# Patient Record
Sex: Female | Born: 1955 | Race: White | Hispanic: No | Smoking: Former smoker
Health system: Southern US, Community
[De-identification: ages and names within clinical notes are randomized; demographics above are authoritative.]

## PROBLEM LIST (undated history)

## (undated) DIAGNOSIS — I499 Cardiac arrhythmia, unspecified: Secondary | ICD-10-CM

## (undated) DIAGNOSIS — I219 Acute myocardial infarction, unspecified: Secondary | ICD-10-CM

## (undated) DIAGNOSIS — E079 Disorder of thyroid, unspecified: Secondary | ICD-10-CM

## (undated) DIAGNOSIS — Z720 Tobacco use: Secondary | ICD-10-CM

## (undated) DIAGNOSIS — I509 Heart failure, unspecified: Secondary | ICD-10-CM

## (undated) DIAGNOSIS — J449 Chronic obstructive pulmonary disease, unspecified: Secondary | ICD-10-CM

## (undated) DIAGNOSIS — I428 Other cardiomyopathies: Secondary | ICD-10-CM

## (undated) DIAGNOSIS — D649 Anemia, unspecified: Secondary | ICD-10-CM

## (undated) DIAGNOSIS — R0600 Dyspnea, unspecified: Secondary | ICD-10-CM

## (undated) DIAGNOSIS — R112 Nausea with vomiting, unspecified: Secondary | ICD-10-CM

## (undated) DIAGNOSIS — I10 Essential (primary) hypertension: Secondary | ICD-10-CM

## (undated) DIAGNOSIS — I5181 Takotsubo syndrome: Secondary | ICD-10-CM

## (undated) DIAGNOSIS — K219 Gastro-esophageal reflux disease without esophagitis: Secondary | ICD-10-CM

## (undated) DIAGNOSIS — E669 Obesity, unspecified: Secondary | ICD-10-CM

## (undated) DIAGNOSIS — F419 Anxiety disorder, unspecified: Secondary | ICD-10-CM

## (undated) DIAGNOSIS — Z9889 Other specified postprocedural states: Secondary | ICD-10-CM

## (undated) DIAGNOSIS — K922 Gastrointestinal hemorrhage, unspecified: Secondary | ICD-10-CM

## (undated) DIAGNOSIS — E785 Hyperlipidemia, unspecified: Secondary | ICD-10-CM

## (undated) HISTORY — DX: Other cardiomyopathies: I42.8

---

## 2008-05-03 HISTORY — PX: CARDIAC CATHETERIZATION: SHX172

## 2008-06-02 ENCOUNTER — Inpatient Hospital Stay (HOSPITAL_COMMUNITY): Admission: EM | Admit: 2008-06-02 | Discharge: 2008-06-07 | Payer: Self-pay | Admitting: Emergency Medicine

## 2008-06-02 ENCOUNTER — Encounter (INDEPENDENT_AMBULATORY_CARE_PROVIDER_SITE_OTHER): Payer: Self-pay | Admitting: Cardiology

## 2008-06-03 ENCOUNTER — Ambulatory Visit: Payer: Self-pay | Admitting: Infectious Diseases

## 2010-08-17 LAB — D-DIMER, QUANTITATIVE: D-Dimer, Quant: 0.45 ug/mL-FEU (ref 0.00–0.48)

## 2010-08-17 LAB — BASIC METABOLIC PANEL
CO2: 26 mEq/L (ref 19–32)
Calcium: 9.2 mg/dL (ref 8.4–10.5)
Chloride: 103 mEq/L (ref 96–112)
GFR calc Af Amer: >60 mL/min (ref >60)
Potassium: 4.6 mEq/L (ref 3.5–5.1)
Sodium: 137 mEq/L (ref 135–145)

## 2010-08-17 LAB — POCT CARDIAC MARKERS: Myoglobin, poc: 110 ng/mL (ref 12–200)

## 2010-08-17 LAB — DIFFERENTIAL
Basophils Relative: 1 % (ref 0–1)
Eosinophils Relative: 0 % (ref 0–5)
Monocytes Absolute: 0.4 10*3/uL (ref 0.1–1.0)
Monocytes Relative: 6 % (ref 3–12)
Neutro Abs: 4.7 10*3/uL (ref 1.7–7.7)

## 2010-08-17 LAB — COMPREHENSIVE METABOLIC PANEL
ALT: 19 U/L (ref 0–35)
AST: 30 U/L (ref 0–37)
CO2: 26 mEq/L (ref 19–32)
Chloride: 106 mEq/L (ref 96–112)
Creatinine, Ser: 0.91 mg/dL (ref 0.4–1.2)
GFR calc Af Amer: >60 mL/min (ref >60)
GFR calc non Af Amer: >60 mL/min (ref >60)
Sodium: 141 mEq/L (ref 135–145)
Total Bilirubin: 1.2 mg/dL (ref 0.3–1.2)

## 2010-08-17 LAB — CARDIAC PANEL(CRET KIN+CKTOT+MB+TROPI): Total CK: 137 U/L (ref 7–177)

## 2010-08-17 LAB — CBC
HCT: 43 % (ref 36.0–46.0)
Hemoglobin: 14.4 g/dL (ref 12.0–15.0)
MCHC: 33.6 g/dL (ref 30.0–36.0)
MCV: 95.1 fL (ref 78.0–100.0)
RBC: 4.52 MIL/uL (ref 3.87–5.11)

## 2010-08-17 LAB — HEPARIN LEVEL (UNFRACTIONATED)
Heparin Unfractionated: 0.6 IU/mL (ref 0.30–0.70)
Heparin Unfractionated: 0.86 IU/mL — ABNORMAL HIGH (ref 0.30–0.70)

## 2010-08-17 LAB — TROPONIN I: Troponin I: 1.13 ng/mL (ref 0.00–0.06)

## 2010-08-17 LAB — TSH: TSH: 2.138 u[IU]/mL (ref 0.350–4.500)

## 2010-08-18 LAB — CBC
HCT: 37.6 % (ref 36.0–46.0)
HCT: 38.2 % (ref 36.0–46.0)
HCT: 39.3 % (ref 36.0–46.0)
Hemoglobin: 12.2 g/dL (ref 12.0–15.0)
Hemoglobin: 12.8 g/dL (ref 12.0–15.0)
Hemoglobin: 13.1 g/dL (ref 12.0–15.0)
Hemoglobin: 13.4 g/dL (ref 12.0–15.0)
Hemoglobin: 13.4 g/dL (ref 12.0–15.0)
MCHC: 33.4 g/dL (ref 30.0–36.0)
MCHC: 33.6 g/dL (ref 30.0–36.0)
MCHC: 34.2 g/dL (ref 30.0–36.0)
MCHC: 35.5 g/dL (ref 30.0–36.0)
MCV: 93.6 fL (ref 78.0–100.0)
MCV: 94.3 fL (ref 78.0–100.0)
Platelets: 174 10*3/uL (ref 150–400)
RBC: 3.82 MIL/uL — ABNORMAL LOW (ref 3.87–5.11)
RBC: 4 MIL/uL (ref 3.87–5.11)
RBC: 4.02 MIL/uL (ref 3.87–5.11)
RDW: 13.2 % (ref 11.5–15.5)
RDW: 13.3 % (ref 11.5–15.5)
RDW: 13.4 % (ref 11.5–15.5)
RDW: 13.4 % (ref 11.5–15.5)
RDW: 13.4 % (ref 11.5–15.5)
RDW: 13.7 % (ref 11.5–15.5)
WBC: 3.6 10*3/uL — ABNORMAL LOW (ref 4.0–10.5)
WBC: 5 10*3/uL (ref 4.0–10.5)

## 2010-08-18 LAB — POCT I-STAT 3, ART BLOOD GAS (G3+)
Acid-base deficit: 1 mmol/L (ref 0.0–2.0)
Acid-base deficit: 3 mmol/L — ABNORMAL HIGH (ref 0.0–2.0)
Bicarbonate: 23.8 mEq/L (ref 20.0–24.0)
O2 Saturation: 93 %
TCO2: 25 mmol/L (ref 0–100)
pH, Arterial: 7.342 — ABNORMAL LOW (ref 7.350–7.400)
pO2, Arterial: 37 mmHg — CL (ref 80.0–100.0)

## 2010-08-18 LAB — LIPID PANEL: Cholesterol: 161 mg/dL (ref 0–200)

## 2010-08-18 LAB — HIV ANTIBODY (ROUTINE TESTING W REFLEX): HIV: NONREACTIVE

## 2010-08-18 LAB — BASIC METABOLIC PANEL
BUN: 16 mg/dL (ref 6–23)
CO2: 27 mEq/L (ref 19–32)
CO2: 27 mEq/L (ref 19–32)
CO2: 28 mEq/L (ref 19–32)
CO2: 28 mEq/L (ref 19–32)
Calcium: 9 mg/dL (ref 8.4–10.5)
Calcium: 9.1 mg/dL (ref 8.4–10.5)
Calcium: 9.3 mg/dL (ref 8.4–10.5)
Chloride: 103 mEq/L (ref 96–112)
Chloride: 107 mEq/L (ref 96–112)
Creatinine, Ser: 0.86 mg/dL (ref 0.4–1.2)
Creatinine, Ser: 0.9 mg/dL (ref 0.4–1.2)
GFR calc Af Amer: >60 mL/min (ref >60)
GFR calc Af Amer: >60 mL/min (ref >60)
GFR calc non Af Amer: >60 mL/min (ref >60)
GFR calc non Af Amer: >60 mL/min (ref >60)
Glucose, Bld: 103 mg/dL — ABNORMAL HIGH (ref 70–99)
Glucose, Bld: 80 mg/dL (ref 70–99)
Glucose, Bld: 90 mg/dL (ref 70–99)
Glucose, Bld: 94 mg/dL (ref 70–99)
Glucose, Bld: 98 mg/dL (ref 70–99)
Potassium: 3.8 mEq/L (ref 3.5–5.1)
Potassium: 3.9 mEq/L (ref 3.5–5.1)
Potassium: 4.1 mEq/L (ref 3.5–5.1)
Sodium: 136 mEq/L (ref 135–145)
Sodium: 136 mEq/L (ref 135–145)
Sodium: 141 mEq/L (ref 135–145)
Sodium: 142 mEq/L (ref 135–145)

## 2010-08-18 LAB — HISTOPLASMA ANTIGEN, URINE
Histoplasma Antigen Urine: NEGATIVE
Histoplasma Antigen, urine: <2 U/mL

## 2010-08-18 LAB — MISCELLANEOUS TEST

## 2010-08-18 LAB — CULTURE, BLOOD (ROUTINE X 2): Culture: NO GROWTH

## 2010-08-18 LAB — SEDIMENTATION RATE: Sed Rate: 25 mm/hr — ABNORMAL HIGH (ref 0–22)

## 2010-08-18 LAB — HEPARIN LEVEL (UNFRACTIONATED): Heparin Unfractionated: 0.57 IU/mL (ref 0.30–0.70)

## 2010-09-15 NOTE — Cardiovascular Report (Signed)
NAMELEONARDA, [name]              ACCOUNT NO.:  [id number]   MEDICAL RECORD NO.:  [id number]          PATIENT TYPE:  INP   LOCATION:  2928                         FACILITY:  [facility]   PHYSICIAN:  Jake Bathe, MD      DATE OF BIRTH:  May 30, 1955   DATE OF PROCEDURE:  DATE OF DISCHARGE:                            CARDIAC CATHETERIZATION   PROCEDURES:  1. Left heart catheterization.  2. Selective coronary angiography.  3. Left ventriculogram.  4. Right heart catheterization with cardiac outputs measured.   INDICATIONS:  Non-ST-elevation myocardial infarction with troponin  elevation of approximately 1 with deep T-wave inversions on ECG abnormal  and shortness of breath.  Ejection fraction 30-35% on echocardiogram  with mid anterior to apical as well as mid inferior to apical akinesis.   PROCEDURE DETAILS:  Informed consent was obtained.  Risk of stroke,  heart attack, death, renal impairment, bleeding, arterial damage were  explained to the patient at length.  She was prepped in a sterile  fashion and placed on the catheterization table.  Lidocaine 1% was used  for local anesthesia.  Visualization of the femoral head was obtained  under fluoroscopy.  Using the modified Seldinger technique, a 6-French  sheath was placed into the right femoral artery.  A Judkins left #4  catheter was used guided by wire and fluoroscopy and selectively  cannulated in the left main artery.  Multiple views with hand injection  of Omnipaque were obtained.  This catheter was then exchanged for a  Judkins right #4 catheter and multiple views with hand injection of  Omnipaque were obtained in the right coronary artery.  This catheter was  exchanged for an angle pigtail which was used across the aortic valve  into the left ventricle.  Hemodynamics were obtained.  In the RAO  position under power injection 35 mL of contrast was used for a left  ventriculogram.  This catheter was then brought across the aortic  valve  and hemodynamics were obtained.  Following the left heart  catheterization using the modified Seldinger technique, a 7-French  sheath was placed into the right femoral vein.  A Swan Ganz or balloon-  tipped catheter was then used to obtain multiple pressures sequentially  in the right throughout right heart structures.  Oxygen saturations were  obtained.  Both Fick  and thermal cardiac outputs were obtained.  Following the procedure, both catheters were removed and sheath was  removed.  The patient was hemodynamically stable.   FINDINGS:  1. Left main artery - no angiographically significant coronary artery      disease.  2. Left anterior descending artery - no angiographically significant      coronary artery disease.  There is one early branching diagonal      which is moderate-sized caliber.  The mid to distal portion of the      LAD is small in caliber and continues to wrap around the apex.  3. Left circumflex artery.  No angiographically significant coronary      artery disease.  There is one early branching obtuse marginal with  otherwise no disease.  4. Right coronary artery dominant vessel giving rise to the PDA.  No      angiographically significant coronary artery disease present.  5. Left ventriculogram ejection fraction 30-35%.  Normal basilar wall      contraction with mid to distal anterior and inferior wall akinesis.      There was no evidence of any thrombus in the left ventricle.  No      significant mitral regurgitation.   HEMODYNAMICS:  Left ventricular systolic pressure was 113 with an end-  diastolic pressure of 20.  Aortic pressure was 113/56 mean of 77.  There  was no gradient.  Right heart catheterization:  1. Right atrium 9/6 with a mean of 5, right ventricle 34/0, right      ventricular end-diastolic pressure of 8 mmHg.  Pulmonary artery      pressure was 36/15 with a mean of 25 mmHg.  Pulmonary capillary      wedge pressure was 19/18 with a mean  of 14 mmHg.  Cardiac output by      thermal method was 4.8 L/min averaged with a cardiac index of 2.54.      Cardiac output by Fick method was 5.36 L/min with a cardiac index      of 2.8.  Pulmonary artery saturation was 67%.  Aortic saturation      was 93%.   IMPRESSION:  1. No angiographically significant coronary artery disease.  2. Severely decreased left ventricular ejection fraction of 30-35%      with wall motion abnormality consistent with stress-induced      cardiomyopathy, basilar sparing of contractile performance with      akinesis of the mid-to-distal anterior and inferior wall portions.      No evidence of thrombus noted.  3. Normal aortic valve gradient, no mitral regurgitation.  4. Upper normal right-sided pressures with pulmonary artery pressure      of 36/15 with a mean of 25.   PLAN:  We will continue to treat her heart failure or acute systolic  dysfunction with both beta blocker and ACE inhibitor as tolerated.  Continue aspirin at this time.  We will discontinue heparin.  Given  impressive lipid profile, I will decrease her statin.  I will  investigate once again any stressful incidences that may have occurred  in her life recently.  The only thing that she had mentioned previously  was this no event here in [place].  Continue with Lasix as needed.   Her left upper lobe density was investigated by Dr. Rito Ehrlich with [facility]  hospitalist who stated that she did not require any treatment at this  time.      Jake Bathe, MD  Electronically Signed     MCS/MEDQ  D:  06/03/2008  T:  06/04/2008  Job:  [id number]

## 2010-09-15 NOTE — H&P (Signed)
[name], TOPPING              ACCOUNT NO.:  [id number]   MEDICAL RECORD NO.:  [id number]          PATIENT TYPE:  EMS   LOCATION:  MAJO                         FACILITY:  [facility]   PHYSICIAN:  Jake Bathe, MD      DATE OF BIRTH:  01-May-1956   DATE OF ADMISSION:  06/02/2008  DATE OF DISCHARGE:                              HISTORY & PHYSICAL   CHIEF COMPLAINT:  Shortness of breath/non-ST-elevation myocardial  infarction.   HISTORY OF PRESENT ILLNESS:  A 55 year old female with no prior past  medical history, no prior cardiovascular history who quit tobacco use in  2004 with no prior history of hypertension, hyperlipidemia, who has a  brother who had bypass surgeries at age 43, and who yesterday developed  acute worsening of shortness of breath at approximately 3:00 p.m.  following work.  She was outside and shoveled snow for only a few short  minutes and noticed excessive difficulty breathing.  She took her pulse  and it was noted to be elevated in the 120s.  She noticed that she was  having difficulty breathing and needed to take a deep breath.  She has  noted over the past 2 weeks or 3 weeks that she has had increasing  orthopnea for difficulty lying flat in regards to breathing.  She did  have some mild diaphoresis and palpitations surrounding this episode.  She denies any chest pressure or chest pain, however.  Denies nausea,  weight loss, fevers, or cough.  No prior pneumonias, hospitalizations,  or travel.  She has no recent sick contacts.  Currently in the emergency  department, she is still with mild-to-moderate shortness of breath but  better when sitting up or leaning forward.   Her EKG in the emergency department shows diffuse T-wave inversion  consistent with ischemia.  She also has poor R-wave progression.  In  addition, her cardiac biomarkers are elevated with a initial set CK of  110, MB of 9.9, troponin of 0.34 and second set showing a CK of 137, MB  of 15.5, and  troponin of 0.97.   Her chest x-ray showed a left upper lobe possible mass and therefore a  chest CT was performed which showed no evidence of pulmonary embolism.  However, she did have a left upper lobe interstitial confluency which  may be suggestive of fungal pneumonia or perhaps sarcoidosis.  The  radiologist did not feel that this was a malignancy - Dr. Rito Ehrlich with  [facility] has been consulted regarding this matter.   PAST MEDICAL HISTORY:  None.   MEDICATIONS:  None.   ALLERGIES:  No known drug allergies.   SOCIAL HISTORY:  Denies tobacco use but quit in 2004.  She does drink  approximately 3-4 drinks of alcohol or glasses per day.  She works at a  nursing home.  No drug use.  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   FAMILY HISTORY:  Brother had bypass surgery at age 37, so early family  history of coronary artery disease.   REVIEW OF SYSTEMS:  Denies any headache, bleeding, joint pain, visual  changes, no cough, as per HPI above.  Unless stated above, all other 12  review of systems negative.   PHYSICAL EXAMINATION:  VITAL SIGNS:  Blood pressure currently 106/99,  respirations 25-28, saturating 99% on 2 L, and afebrile.  GENERAL:  Alert and oriented x3 in no acute distress, sitting up in bed.  EYES:  Well-perfused conjunctivae.  EOMI.  No scleral icterus.  NECK:  Supple.  No JVD appreciated.  No lymphadenopathy.  No  thyromegaly.  No carotid bruits.  Normal carotid upstrokes.  CARDIOVASCULAR:  Tachycardic, regular rhythm with what sounds like an S3  gallop.  Normal-appearing PMI.  LUNGS:  Clear to auscultation bilaterally.  I do not appreciate any lung  changes in her left upper lobe.  She does not have any basilar crackles.  Normal respiratory effort except for slight tachypnea.  ABDOMEN:  Soft and nontender.  Normoactive bowel sounds.  No bruits.  No  hepatosplenomegaly.  EXTREMITIES:  No clubbing, cyanosis, or edema.  2+ femoral pulses  bilaterally.  No  bruits.  SKIN:  Warm, dry, and intact.  No rashes.  Slightly diaphoretic.  NEUROLOGIC:  Nonfocal.  No tremors noted.  No weaknesses.  PSYCH:  Normal affect.  Normal mood.  Here with her daughter at bedside.   DIAGNOSTIC DATA:  Chest x-ray and CT angiogram of chest shows as left  upper lobe density of unknown significance possible sarcoidosis or  fungal pneumonia, further workup pending.  No evidence of pulmonary  embolism.  EKG shows sinus tachycardia with T-wave diffuse inversion  suggestive of ischemia with no ST elevations or depressions.  Poor R-  wave progression.   LABORATORY DATA:  Cardiac biomarkers as demonstrate above with the most  notable MB of 15.5 and troponin of 0.97.  Sodium 137, potassium 4.6, BUN  12, creatinine 0.8, glucose 118, and calcium 9.2, normal.  D-dimer 0.45,  normal.  BNP elevated at 751.  White count of 6.0, hemoglobin 14.4,  hematocrit 43, and platelets 208.   ASSESSMENT AND PLAN:  1. Non-ST elevation myocardial infarction - EKG with T-wave inversion      consistent with ischemia, positive cardiac biomarkers consistent      with myocardial injury.  Bedside echo done by me does demonstrate      what I believe to be a distal anterior wall akinesis or apical      akinesis, however, I have called in echo technician for further      evaluation or official evaluation.  She has already been started on      heparin IV ACS, which I will continue and continue aspirin.  I will      add a low-dose beta-blocker to her regimen keeping in mind that she      may be in mild degree of failure watching for any signs of      deterioration.  Her BNP is elevated.  Therefore, I will give her      Lasix 40 mg IV x1 hopefully helping her with her orthopnea.  I have      discussed cardiac catheterization with her which I will put her on      the board for the morning.  Risks and benefits of stroke, heart      attack, death, renal impairment, bleeding, and arterial damage have       been explained to her at length with her daughter at bedside.  2. Left upper lobe density - questionable etiology.  I asked Dr.  Rito Ehrlich with [facility] to consult for further evaluation.      Of course, this may come into play if she does need bypass surgery.      Jake Bathe, MD  Electronically Signed     MCS/MEDQ  D:  06/02/2008  T:  06/02/2008  Job:  [id number]

## 2010-09-15 NOTE — Discharge Summary (Signed)
[name], [name]              ACCOUNT NO.:  [id number]   MEDICAL RECORD NO.:  [id number]          PATIENT TYPE:  INP   LOCATION:  3740                         FACILITY:  [facility]   PHYSICIAN:  Jake Bathe, MD      DATE OF BIRTH:  1955-05-09   DATE OF ADMISSION:  06/02/2008  DATE OF DISCHARGE:  06/07/2008                               DISCHARGE SUMMARY   DISCHARGE DIAGNOSES:  1. Acute systolic heart failure/left ventricular dysfunction -      ejection fraction on admission 30-35% with anteroapical,      inferoapical akinesis confirmed by both echocardiography and      cardiac catheterization.  Bedside echocardiogram on the day of      discharge, which was unofficial demonstrated mild improvement in      these akinetic segments with an ejection fraction currently of      approximately 40-45%.  2. Non-ST-elevation myocardial infarction - CK 137, MB of 15.5,      troponin of 1.1 with associated abnormal EKG changes with diffuse T-      wave inversions, quite severe in the precordial leads.  3. Left upper lobe lung infiltrate - Appreciate infectious disease      consult by Dr. Sampson Goon.  Radiologist commented as well as      infectious disease that this may be a possible granulomatous or      fungal process.  She has had no symptoms of pneumonia such as      fevers, chills, weight loss, elevated white count.  Thus far, her      infectious disease workup consisting of human immunodeficiency      virus, Cryptococcus antibody has been negative.  She does have      infectious disease followup.  4. Alcohol use - admits to drinking 3-4 beers per day.  May be playing      a role in the etiology of her cardiomyopathy.  5. Hypotension - she has had difficulty up-titrating both beta-blocker      and angiotensin-converting enzyme inhibitor secondary to blood      pressures below 100 systolic.  No dizziness on the morning of      discharge.   PROCEDURES:  1. Left heart catheterization - no  angiographically significant      coronary artery disease - ejection fraction of 30-35% with apical      akinesis consistent with stress-induced cardiomyopathy with basilar      sparing of contractile performance.  Right heart catheterization      demonstrated pulmonary artery mean pressure of 25 mmHg with a wedge      pressure of 14 mmHg.  2. Chest CT as above.   BRIEF HOSPITAL COURSE:  This is a 55 year old female with no prior past  medical history who was admitted with subacute shortness of breath  consistent with acute heart failure.  Her EKG was markedly abnormal with  deeply inverted T-waves consistent with ischemia and her troponin was  elevated consistent with non-ST-elevation MI.  When questioning, she did  have the anniversary of her mother's death approximately 4 days  prior to  presentation, which may have been stressful incident for her.  She was  also shoveling snow when she was acutely short of breath.  A chest CT  showed no evidence of pulmonary embolism.  The next day she was taken to  the Cardiac Catheterization Lab where she showed no evidence of thrombus  or coronary artery disease and her right-sided pressures were within the  upper normal range.  Her left ventriculogram demonstrated basilar  sparing of contractile performance with essentially anteroapical and  inferoapical akinesis.  The morphology of this look like stressed-  induced cardiomyopathy.  Up-titration of metoprolol and lisinopril was  difficult in that the day after her catheterization 5mg  of lisinopril  was administered and her pressures were down into the upper 70s to low  80s.  Therefore on the discharge day, she was only on 12.5 mg metoprolol  b.i.d. as well as lisinopril 2.5 mg once a day.  On the day of  discharge, she states that she is not dizzy, no syncope, ambulating well  without any difficulty.  Shortness of breath has essentially resolved.  She no longer complains of any orthopnea.   Infectious disease as well as  internal medicine consults were obtained in regards to her left upper  lobe lung process and they will follow up with her in approximately 1  month.  She may require further CT scan surveillance, plus/minus  bronchoscopy.   On the morning of discharge, I did perform personally a bedside  echocardiogram, which is unofficial and her apex did look like it had  some mild improvement with improvement in her overall ejection fraction  of approximately 40-45%.   DISCHARGE MEDICATIONS:  1. Aspirin 81 mg once a day.  2. Simvastatin 10 mg once a day.  3. Lisinopril 2.5 mg once a day.  4. Metoprolol 12.5 mg twice a day.   LABORATORY DATA:  BNP on admission was 751, subsequent BNP was 200.  Hemoglobin A1c 4.8.  Thyroid stimulating hormone was normal.  LDL was  66, HDL 85, triglycerides 49.  White count 5.0, hemoglobin 13.4,  hematocrit 39.3, platelets 174, creatinine 0.9.  LFTs within normal  range.  Sed rate mildly elevated at 25.  HIV, nonreactive.  Cryptococcal  antibody, negative.  Blood cultures, no growth.  Repeat chest x-ray  showed subtle resolution of left upper lobe lung process, perhaps they  recommended CT scan followup.   PATIENT INSTRUCTIONS:  Increase activity slowly.  Low-salt diet with 1-2  L fluid restriction.  She has a followup with me on Wednesday, June 19, 2008, with an echocardiogram scheduled at 9:45 a.m. and appointment  with me at 11:30 a.m.  Infectious Disease/Dr. Sampson Goon will contact  her regarding followup appointment in approximately 1 month regarding  left upper lobe lung process.      Jake Bathe, MD  Electronically Signed     MCS/MEDQ  D:  06/07/2008  T:  06/07/2008  Job:  [id number]   cc:   Mick Sell, MD

## 2010-09-15 NOTE — Consult Note (Signed)
Renee, Wood              ACCOUNT NO.:  [id number]   MEDICAL RECORD NO.:  [id number]          PATIENT TYPE:  INP   LOCATION:  1823                         FACILITY:  [facility]   PHYSICIAN:  Hollice Espy, M.D.DATE OF BIRTH:  Jun 02, 1955   DATE OF CONSULTATION:  06/02/2008  DATE OF DISCHARGE:                                 CONSULTATION   PRIMARY CARE PHYSICIAN:  The patient has no primary care physician.   REASON FOR CONSULTATION:  Abnormal CT findings.   HISTORY OF PRESENT ILLNESS:  The patient is a 55 year old white female  with no past medical history other then what sounds to be alcohol excess  who has otherwise not really ever seen a doctor and came into the  emergency room on June 02, 2008 complaining of some increasing  shortness of breath for the last 1 day.  When she was evaluated, she was  noted to have a heart rate of 122, sinus tach, with a blood pressure  155/100.  Her oxygen saturations were actually 95% on room air and 99%  on 2 liters.  Labs were ordered and she was found to have a BNP of 751  and a CPK of 110, MB of 9.9 with a troponin of 0.34.  Subsequent sets  showed an elevation consistent with an early myocardial infarction.  A  chest x-ray was done as part of the initial workup and showed some  probable chronic interstitial lung disease with right upper lobe  scarring and a 4.4 x 1.7 cm mass-like density in the left upper lobe.  A  chest CT was recommended.  Chest CT findings showed a confluence in that  upper lobe, but no signs of metastases; more consistent with a fungal  pneumonia.  With these findings, Dr. Anne Fu from Cardiology consulted  the [facility] hospitalist for further evaluation of these findings.  When I  talked to the patient, she was doing well.  She complained of feeling a  little bit short of breath, but better with oxygen.  She denied any  chest pain.  She denied any other symptoms such as headaches, vision  changes or dysphagia.  No  weight loss in the past year, no palpitations,  no wheezing.  She has had no cough, productive or nonproductive.  No  abdominal pain.  No hematuria, dysuria, constipation, diarrhea, focal  extremity numbness, weakness or pain.  The patient tells me she has had  no travel history.  She currently lives in her own trailer home.  She  had not noted any problems with mold.  No one else lives in the house.  She has no pets and no other symptoms or associated history.   REVIEW OF SYSTEMS:  Otherwise negative.   PAST MEDICAL HISTORY:  None.   MEDICATIONS:  None.   ALLERGIES:  None.   SOCIAL HISTORY:  She denies any tobacco or drug use, but she does drink,  she says, about 3 to 4 drinks a day.  She says sometimes it is more,  sometimes it is less.   FAMILY HISTORY:  Noted for COPD and lung cancer.  PHYSICAL EXAMINATION:  PATIENT'S VITALS:  On admission, temperature  97.3, heart rate 122, now down to 99, blood pressure 155/100, now down  to 121/92, respirations 20, O2 sat 95% on room air.  GENERAL:  She is alert and oriented x3, in no apparent distress.  HEENT:  Normocephalic, atraumatic.  Mucous membranes are moist.  NECK:  She has no carotid bruits.  HEART:  Regular rhythm, mild tachycardia.  LUNGS:  Actually for the most part clear to auscultation bilaterally.  CHEST:  Decreased breath sounds, but it is tough to say if this is  secondary to body habitus.  ABDOMEN:  Soft, obese, nontender, positive bowel sounds.  EXTREMITIES:  No clubbing, cyanosis or edema.   LAB WORK:  BNP 751, sodium 137, potassium 4.6, chloride 103, bicarb 26,  BUN  12, creatinine 0.9, glucose 118.  White count 6, no shift,  eosinophil count is completely normal, H&H of 14.4 and 43, MCV of 95,  platelet count 208.  D-dimer 0.45.  CPK 110, MB 9.9, troponin 0.34.  Second set:  CPK 137, MB 15.5, troponin 0.97.  Subsequent sets are  ordered.  Magnesium, TSH and A1c are ordered and are pending.   CHEST X-RAY NOTES:   Probable chronic interstitial lung disease, right  upper lobe scarring, 4.1 x 1.7 cm mass-like density in the left upper  lobe recommending chest CT.  Chest CT report showed no pulmonary emboli,  small bilateral pleural effusions, normal sized calcified mediastinal  bilateral hilar lymph nodes compatible with previous granulomatous  infection and no masses suspicious for malignancy.  Extensive patchy and  irregular alveolar and interstitial opacities in both lungs most  confluent in the upper lobes, this is suspicious for an atypical  infection such as a fungal pneumonia, sarcoidosis was less likely a  consideration.   ASSESSMENT/PLAN:  1. Abnormal CT:  Questionable fungal pneumonia versus other atypical      infection.  At this time, would not treat.  We will just go with      sputum culture only and discuss with Infectious Disease.  2. Myocardial infarction:  Cardiology is managing the patient.  3. Alcohol excess:  We will further discuss with patient.   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      Hollice Espy, M.D.  Electronically Signed     SKK/MEDQ  D:  06/02/2008  T:  06/02/2008  Job:  [id number]   cc:   Jake Bathe, MD  [facility]

## 2011-02-15 ENCOUNTER — Inpatient Hospital Stay (HOSPITAL_COMMUNITY)
Admission: EM | Admit: 2011-02-15 | Discharge: 2011-02-18 | DRG: 280 | Disposition: A | Discharge status: Home Health | Payer: Self-pay | Source: Ambulatory Visit | Attending: Cardiology | Admitting: Cardiology

## 2011-02-15 ENCOUNTER — Emergency Department (HOSPITAL_COMMUNITY): Payer: Self-pay

## 2011-02-15 DIAGNOSIS — I959 Hypotension, unspecified: Secondary | ICD-10-CM | POA: Diagnosis present

## 2011-02-15 DIAGNOSIS — I2510 Atherosclerotic heart disease of native coronary artery without angina pectoris: Secondary | ICD-10-CM | POA: Diagnosis present

## 2011-02-15 DIAGNOSIS — I5181 Takotsubo syndrome: Secondary | ICD-10-CM | POA: Diagnosis present

## 2011-02-15 DIAGNOSIS — I509 Heart failure, unspecified: Secondary | ICD-10-CM | POA: Diagnosis present

## 2011-02-15 DIAGNOSIS — I214 Non-ST elevation (NSTEMI) myocardial infarction: Principal | ICD-10-CM | POA: Diagnosis present

## 2011-02-15 DIAGNOSIS — Z87891 Personal history of nicotine dependence: Secondary | ICD-10-CM

## 2011-02-15 DIAGNOSIS — R0602 Shortness of breath: Secondary | ICD-10-CM

## 2011-02-15 DIAGNOSIS — I252 Old myocardial infarction: Secondary | ICD-10-CM

## 2011-02-15 DIAGNOSIS — I5021 Acute systolic (congestive) heart failure: Secondary | ICD-10-CM | POA: Diagnosis present

## 2011-02-15 LAB — DIFFERENTIAL
Basophils Absolute: 0 10*3/uL (ref 0.0–0.1)
Eosinophils Relative: 5 % (ref 0–5)
Lymphocytes Relative: 19 % (ref 12–46)
Lymphs Abs: 1.1 10*3/uL (ref 0.7–4.0)
Monocytes Absolute: 0.3 10*3/uL (ref 0.1–1.0)
Neutro Abs: 3.8 10*3/uL (ref 1.7–7.7)

## 2011-02-15 LAB — PROTIME-INR: INR: 1.02 (ref 0.00–1.49)

## 2011-02-15 LAB — BASIC METABOLIC PANEL
BUN: 17 mg/dL (ref 6–23)
CO2: 29 mEq/L (ref 19–32)
Calcium: 9.8 mg/dL (ref 8.4–10.5)
Chloride: 102 mEq/L (ref 96–112)
Creatinine, Ser: 0.92 mg/dL (ref 0.50–1.10)
Glucose, Bld: 93 mg/dL (ref 70–99)

## 2011-02-15 LAB — POCT I-STAT TROPONIN I
Troponin i, poc: 1.61 ng/mL (ref 0.00–0.08)
Troponin i, poc: 1.85 ng/mL (ref 0.00–0.08)

## 2011-02-15 LAB — CBC
HCT: 38.2 % (ref 36.0–46.0)
Hemoglobin: 13.2 g/dL (ref 12.0–15.0)
MCHC: 34.6 g/dL (ref 30.0–36.0)
MCV: 90.1 fL (ref 78.0–100.0)

## 2011-02-15 LAB — CK TOTAL AND CKMB (NOT AT ARMC)
CK, MB: 13 ng/mL (ref 0.3–4.0)
Relative Index: 7.8 — ABNORMAL HIGH (ref 0.0–2.5)

## 2011-02-16 LAB — COMPREHENSIVE METABOLIC PANEL
Alkaline Phosphatase: 70 U/L (ref 39–117)
BUN: 16 mg/dL (ref 6–23)
Chloride: 105 mEq/L (ref 96–112)
Creatinine, Ser: 0.83 mg/dL (ref 0.50–1.10)
GFR calc Af Amer: >90 mL/min (ref >90)
Glucose, Bld: 94 mg/dL (ref 70–99)
Potassium: 3.7 mEq/L (ref 3.5–5.1)
Total Bilirubin: 0.6 mg/dL (ref 0.3–1.2)
Total Protein: 6.5 g/dL (ref 6.0–8.3)

## 2011-02-16 LAB — CBC
HCT: 36.1 % (ref 36.0–46.0)
Hemoglobin: 12.2 g/dL (ref 12.0–15.0)
MCH: 30.9 pg (ref 26.0–34.0)
MCHC: 33.8 g/dL (ref 30.0–36.0)
MCV: 91.4 fL (ref 78.0–100.0)
Platelets: 194 10*3/uL (ref 150–400)
RBC: 3.95 MIL/uL (ref 3.87–5.11)
RDW: 13 % (ref 11.5–15.5)
WBC: 5.9 10*3/uL (ref 4.0–10.5)

## 2011-02-16 LAB — APTT: aPTT: 103 s — ABNORMAL HIGH (ref 24–37)

## 2011-02-16 LAB — CARDIAC PANEL(CRET KIN+CKTOT+MB+TROPI)
CK, MB: 10.4 ng/mL (ref 0.3–4.0)
Relative Index: 8.1 — ABNORMAL HIGH (ref 0.0–2.5)
Total CK: 129 U/L (ref 7–177)
Troponin I: 1.25 ng/mL

## 2011-02-16 LAB — POCT ACTIVATED CLOTTING TIME: Activated Clotting Time: 155 seconds

## 2011-02-16 LAB — D-DIMER, QUANTITATIVE
D-Dimer, Quant: 0.47 ug{FEU}/mL (ref 0.00–0.48)
D-Dimer, Quant: 0.54 ug{FEU}/mL — ABNORMAL HIGH (ref 0.00–0.48)

## 2011-02-16 LAB — HEPARIN LEVEL (UNFRACTIONATED)
Heparin Unfractionated: 0.55 [IU]/mL (ref 0.30–0.70)
Heparin Unfractionated: 0.35 [IU]/mL (ref 0.30–0.70)

## 2011-02-16 LAB — MRSA PCR SCREENING: MRSA by PCR: NEGATIVE

## 2011-02-16 LAB — LIPID PANEL
Cholesterol: 143 mg/dL (ref 0–200)
Triglycerides: 60 mg/dL (ref <150)

## 2011-02-16 LAB — PRO B NATRIURETIC PEPTIDE: Pro B Natriuretic peptide (BNP): 1438 pg/mL — ABNORMAL HIGH (ref 0–125)

## 2011-02-16 NOTE — H&P (Signed)
[name], [name]              ACCOUNT NO.:  [id number]  MEDICAL RECORD NO.:  [id number]  LOCATION:  2311                         FACILITY:  [facility]  PHYSICIAN:  Lenon Oms, MD  DATE OF BIRTH:  March 09, 1956  DATE OF ADMISSION:  02/15/2011 DATE OF DISCHARGE:                             HISTORY & PHYSICAL   PRIMARY CARDIOLOGIST:  Loraine Leriche C. Anne Fu, MD  CHIEF COMPLAINT:  Shortness of breath.  HISTORY OF PRESENT ILLNESS:  Ms. [name] is a 55 year old Caucasian female with past medical history significant for coronary artery disease with non-ST-segment elevation MI in 2010 with acute systolic heart failure, LV dysfunction, EF of 30-35%, who presents to the emergency department today here at [facility] with chief complaint of shortness of breath.  The patient stated that she today while at work she felt short of breath requiring her to sit down and stop what she was doing.  In addition, at that time, she felt dizzy, like her heart was racing along with diaphoresis and weakness.  The patient stated that she had this happened to her 1 week ago at home.  However, this lasted 5 minutes and went away after she sat down to rest.  She did not have any episodes since.  As she worked at a nursing home, she sent for the for the nurse who came to evaluate her.  At that time, vital signs were reportedly stable.  She was given 4 nitroglycerin and sent to the emergency department for further evaluation.  She has also described orthopnea over the last several days, however, denies any increasing lower extremity edema and PND.  She also denies any chest pain at this time.  PAST MEDICAL HISTORY: 1. Coronary artery disease status post non-ST-segment elevation MI in     2010. 2. Systolic heart failure/left ventricular dysfunction, EF of 30-     35% with anteroapical and inferoapical akinesis. 3. Left heart catheterization at that time revealed no     angiographically significant coronary  artery disease.  EF was 30-     35% with apical kinesis consistent with stress-induced     cardiomyopathy.  Right heart catheterization demonstrated pulmonary     artery main pressure of 25 with a wedge pressure of 14.  ALLERGIES:  No known drug allergies.  MEDICATIONS:  None.  SOCIAL HISTORY:  The patient lives in [place] alone.  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  She reports that she quit tobacco in 2004.  She denies any current alcohol use.  She denies any illicit drug use.  FAMILY HISTORY:  Mother passed away from lung cancer in 2005.  Father is alive and has asthma/COPD.  Her brother had a CABG at the age of 90.  REVIEW OF SYSTEMS:  All other systems reviewed and negative other than stated in HPI.  PHYSICAL EXAMINATION:  VITAL SIGNS:  Temperature 98.3, blood pressure 140/88, respiratory rate 21, pulse 94, and 100% on room air.  Upon my evaluation, the patient with blood pressure 94/62. GENERAL:  The patient was in no acute distress. HEENT:  Normocephalic and atraumatic.  Pupils equally round and reactive to light.  Extraocular movements intact.  Anicteric  sclerae. NECK:  Supple.  No JVD. HEART:  Regular rate and rhythm.  Normal S1 and S2.  LUNGS:  Clear to auscultation bilaterally. ABDOMEN:  Normoactive bowel sounds.  Soft, nontender, and nondistended. EXTREMITIES:  No cyanosis, clubbing, or edema. peripheral pulses intact SKIN:  No rash. NEUROLOGIC:  Alert and oriented x3.  No focal deficits.  RADIOLOGY:  The patient had a chest x-ray which revealed no acute abnormalities.  EKG revealed sinus rhythm, heart rate of 88 with normal intervals.  No acute ischemic changes.  LABORATORY DATA:  CBC profile revealed a white count of 5.9, hemoglobin of 13.2, hematocrit of 38.2, and platelets of 188.  Basic metabolic profile revealed a sodium of 139, potassium of 3.6, BUN 17, and creatinine 0.92.  The patient had a troponin of 0.25, followed by followed by  1.85, followed by 4.16.  IMPRESSION:  Acute coronary syndrome/non-ST-elevation myocardial infarction.  PLAN:  The patient will be admitted to the CICU.  We will closely monitor on telemetry.  Repeat EKG p.r.n. upon admission and p.r.n. for chest pain.  Medical management at this time will include aspirin 325, heparin drip, Plavix loading 75 mg daily, statin, and nitroglycerin p.r.n.  At this time, we will place her on low dose beta-blocker, however, given low blood pressure may not be able to tolerate.  The patient will be kept n.p.o. at midnight for possible cardiac catheterization in a.m.  I have discussed this plan in detail with the patient and she is agreeable to proceed.  I have answered all her questions at this time.          ______________________________ Lenon Oms, MD     PB/MEDQ  D:  02/16/2011  T:  02/16/2011  Job:  [id number]  Electronically Signed by Lenon Oms MD on 02/16/2011 05:50:12 AM

## 2011-02-17 LAB — PRO B NATRIURETIC PEPTIDE: Pro B Natriuretic peptide (BNP): 1205 pg/mL — ABNORMAL HIGH (ref 0–125)

## 2011-02-17 LAB — CBC
MCH: 30.6 pg (ref 26.0–34.0)
Platelets: 194 10*3/uL (ref 150–400)
RBC: 4.25 MIL/uL (ref 3.87–5.11)
WBC: 5.4 10*3/uL (ref 4.0–10.5)

## 2011-02-17 LAB — BASIC METABOLIC PANEL
CO2: 26 mEq/L (ref 19–32)
Calcium: 9.5 mg/dL (ref 8.4–10.5)
Chloride: 102 mEq/L (ref 96–112)
Potassium: 3.4 mEq/L — ABNORMAL LOW (ref 3.5–5.1)
Sodium: 138 mEq/L (ref 135–145)

## 2011-02-17 LAB — GLUCOSE, CAPILLARY: Glucose-Capillary: 88 mg/dL (ref 70–99)

## 2011-02-18 LAB — CBC
HCT: 37.9 % (ref 36.0–46.0)
Hemoglobin: 13.1 g/dL (ref 12.0–15.0)
MCH: 31 pg (ref 26.0–34.0)
MCHC: 34.6 g/dL (ref 30.0–36.0)
MCV: 89.6 fL (ref 78.0–100.0)
Platelets: 182 K/uL (ref 150–400)
RBC: 4.23 MIL/uL (ref 3.87–5.11)
RDW: 12.9 % (ref 11.5–15.5)
WBC: 5.7 K/uL (ref 4.0–10.5)

## 2011-02-18 LAB — BASIC METABOLIC PANEL
BUN: 27 mg/dL — ABNORMAL HIGH (ref 6–23)
Calcium: 9.5 mg/dL (ref 8.4–10.5)
Creatinine, Ser: 1.06 mg/dL (ref 0.50–1.10)
GFR calc non Af Amer: 59 mL/min — ABNORMAL LOW (ref >90)
Glucose, Bld: 94 mg/dL (ref 70–99)
Potassium: 4 mEq/L (ref 3.5–5.1)

## 2011-02-19 NOTE — Cardiovascular Report (Signed)
[name], [name]              ACCOUNT NO.:  [id number]  MEDICAL RECORD NO.:  [id number]  LOCATION:  4732                         FACILITY:  [facility]  PHYSICIAN:  Armanda Magic, M.D.     DATE OF BIRTH:  [birth year]  DATE OF PROCEDURE:  02/16/2011 DATE OF DISCHARGE:                           CARDIAC CATHETERIZATION   REFERRING PHYSICIAN:  Jake Bathe, MD.  PROCEDURE: 1. Left heart catheterization. 2. Coronary angiography.  OPERATOR:  Armanda Magic, MD.  INDICATION:  Shortness of breath, abnormal cardiac enzymes.  COMPLICATIONS:  None.  IV ACCESS:  Via left femoral artery 5-French sheath.  IV MEDICATIONS:  Versed 2 mg, fentanyl 25 mcg.  This is a 55 year old female with a history of normal coronary arteries by cath in 2010, at which time, she presented with a non-ST-elevation MI and was diagnosed with Takotsubo cardiomyopathy.  Her EF at that time was 30%-35%.  She presented at that time with shortness of breath and has now had a recurrence of her shortness of breath with a similar presentation now.  She was found to be in acute congestive heart failure with elevated BNP.  Cardiac enzymes were mildly elevated, and she now presents for cardiac catheterization.  The patient was brought to cardiac catheterization laboratory in a fasting, nonsedated state.  Informed consent was obtained.  The patient had continuous heart rate and blood pressure monitoring.  The right groin was prepped and draped in a sterile fashion.  Xylocaine 1% was used for local anesthesia.  Using modified Seldinger technique, attempts were made at gaining access to the right femoral artery, but were unsuccessful.  The left groin was prepped and draped in sterile fashion. Xylocaine 1% was used for local anesthesia.  Using modified Seldinger technique, a 5-French sheath was placed in the left femoral artery. Under fluoroscopic guidance, a 5-French JL-4 catheter was placed in left coronary artery.   Multiple cine films were taken at 30-degree RAO, 40- degree LAO views.  This catheter was then exchanged out over a guidewire for a 4-French angled pigtail catheter which was placed under fluoroscopic guidance in the left ventricular cavity.  Left ventriculography was not performed because of the patient's elevated LVEDP.  The catheter was then pulled back across the aortic valve with no significant gradient noted.  At the end of the procedure, all catheters and sheaths were removed.  Manual compression was performed until adequate hemostasis was obtained.  The patient was transferred back to room in stable condition.  RESULTS: 1. Left main coronary artery is widely patent and trifurcates into     left anterior descending artery, ramus branch, and left circumflex     artery. 2. Left circumflex artery is widely patent throughout its course of     the apex.  It gives rise to a first diagonal which is widely     patent. 3. The ramus branch is moderate in size and widely patent, bifurcating     into 2 vessels, both of which are widely patent. 4. Left circumflex is widely patent throughout its course, giving rise     to one obtuse marginal branch which is widely patent.  The ongoing     circumflex traverses  the AV groove and is widely patent. 5. Right coronary artery is widely patent and bifurcates distally in     the posterior descending artery and posterior lateral artery, both     of which are widely patent. 6. Left ventriculography was not performed because of the patient's     elevated LVEDP.  LV pressure 112/9 mmHg, aortic pressure 122/50     mmHg, LVEDP 31 mmHg.  ASSESSMENT: 1. Normal coronary arteries. 2. Moderate left ventricular dysfunction by catheterization in 2010. 3. Elevated left ventricular diastolic end pressure consistent with     congestive heart failure.  PLAN:  IV Lasix 40 mg IV daily.  Check BMET in the morning.  We will check a STAT D-dimer.  We will also check  a 2-D echocardiogram to reassess LV function.     Armanda Magic, M.D.     TT/MEDQ  D:  02/16/2011  T:  02/17/2011  Job:  [id number]  Electronically Signed by Armanda Magic M.D. on 02/19/2011 08:23:10 PM

## 2011-02-21 NOTE — Discharge Summary (Signed)
  [name], [name]              ACCOUNT NO.:  [id number]  MEDICAL RECORD NO.:  [id number]  LOCATION:                                 FACILITY:  PHYSICIAN:  Jake Bathe, MD      DATE OF BIRTH:  Aug 18, 1955  DATE OF ADMISSION:  02/16/2011 DATE OF DISCHARGE:  02/18/2011                              DISCHARGE SUMMARY   DISCHARGE DIAGNOSES: 1. Acute systolic heart failure. 2. Stress-induced cardiomyopathy/Takotsubo cardiomyopathy. 3. Hypotension. 4. Non-ST elevation myocardial infarction-secondary to stress-induced     cardiomyopathy.  PROCEDURES:  Cardiac catheterization performed on February 16, 2011 by Dr. Mayford Knife showed normal coronary arteries, left ventricular end- diastolic pressure of 31 mmHg, and no other acute findings.  This was very similar prior cardiac catheterization on June 03, 2008. Echocardiogram currently pending, will be performed in the office setting.  DISCHARGE LABS:  Sodium 138, potassium 4.0, BUN 27, creatinine 1.06. BNP from October 17 is 1205.  White count 5.7, hemoglobin 13.1, platelet count 182,000.  Hemoglobin A1c is 5.4.  LDL cholesterol 74.  BRIEF HOSPITAL COURSE:  A 55 year old female who was admitted for increasing shortness of breath, palpitations, and dizziness felt during her work.  She came into the ER and her cardiac markers were elevated with an MB of 13 and troponin of 1.85.  She was taken to the cardiac catheterization lab that morning where she had normal coronary arteries and her left ventricular end-diastolic pressure was elevated.  A left ventriculogram was not performed by Dr. Mayford Knife.  She ended up diuresing with IV Lasix 40 mg, which was then switched to p.o. Lasix 40 mg.  Her discharge weight was 80.9 kg.  Her BNP decreased.  Symptomatically she was feeling better, ambulating without any difficulty.  NYHA class I-II symptoms.  She did ask me to fax a note to her work setting excusing from work until Monday and this was very  reasonable given her acute illness.  I had followup established for her next week in the clinic with Cristopher Peru when I am in the clinic.  Her account with [facility] has been reactivated.  She knows to contact us immediately if any worrisome symptoms develop.  Her blood pressure on discharge was in the low normal range ranging from 98-115 systolic.  Because of this, she was unable to tolerate an ACE inhibitor.  Coreg was initiated at low-dose 3.125 twice a day.  She is satting 96% on room air.  Physical exam shows no crackles on exam and no edema.  Catheterization site clean, dry, and intact.  TOTAL AMOUNT OF TIME SPENT ON DISCHARGE:  37 minutes discussion with the patient, med reconciliation, lab review, making appointment.     Jake Bathe, MD     MCS/MEDQ  D:  02/18/2011  T:  02/18/2011  Job:  [id number]  Electronically Signed by Donato Schultz MD on 02/21/2011 07:59:26 AM

## 2011-03-03 ENCOUNTER — Ambulatory Visit (HOSPITAL_COMMUNITY): Payer: Self-pay | Attending: Cardiology

## 2011-03-15 ENCOUNTER — Emergency Department (HOSPITAL_COMMUNITY)
Admission: EM | Admit: 2011-03-15 | Discharge: 2011-03-16 | Disposition: A | Discharge status: Home / Self Care | Payer: Self-pay | Attending: Emergency Medicine | Admitting: Emergency Medicine

## 2011-03-15 ENCOUNTER — Encounter: Payer: Self-pay | Admitting: *Deleted

## 2011-03-15 ENCOUNTER — Emergency Department (HOSPITAL_COMMUNITY): Payer: Self-pay

## 2011-03-15 DIAGNOSIS — R0602 Shortness of breath: Secondary | ICD-10-CM | POA: Insufficient documentation

## 2011-03-15 DIAGNOSIS — I10 Essential (primary) hypertension: Secondary | ICD-10-CM | POA: Insufficient documentation

## 2011-03-15 DIAGNOSIS — R0789 Other chest pain: Secondary | ICD-10-CM | POA: Insufficient documentation

## 2011-03-15 DIAGNOSIS — I2510 Atherosclerotic heart disease of native coronary artery without angina pectoris: Secondary | ICD-10-CM | POA: Insufficient documentation

## 2011-03-15 DIAGNOSIS — R0609 Other forms of dyspnea: Secondary | ICD-10-CM | POA: Insufficient documentation

## 2011-03-15 DIAGNOSIS — R0989 Other specified symptoms and signs involving the circulatory and respiratory systems: Secondary | ICD-10-CM | POA: Insufficient documentation

## 2011-03-15 DIAGNOSIS — R0600 Dyspnea, unspecified: Secondary | ICD-10-CM

## 2011-03-15 DIAGNOSIS — E785 Hyperlipidemia, unspecified: Secondary | ICD-10-CM | POA: Insufficient documentation

## 2011-03-15 DIAGNOSIS — I252 Old myocardial infarction: Secondary | ICD-10-CM | POA: Insufficient documentation

## 2011-03-15 DIAGNOSIS — I509 Heart failure, unspecified: Secondary | ICD-10-CM | POA: Insufficient documentation

## 2011-03-15 DIAGNOSIS — R209 Unspecified disturbances of skin sensation: Secondary | ICD-10-CM | POA: Insufficient documentation

## 2011-03-15 HISTORY — DX: Disorder of thyroid, unspecified: E07.9

## 2011-03-15 HISTORY — DX: Hyperlipidemia, unspecified: E78.5

## 2011-03-15 HISTORY — DX: Heart failure, unspecified: I50.9

## 2011-03-15 HISTORY — DX: Essential (primary) hypertension: I10

## 2011-03-15 LAB — CBC
HCT: 33.1 % — ABNORMAL LOW (ref 36.0–46.0)
MCV: 89.7 fL (ref 78.0–100.0)
RDW: 13.4 % (ref 11.5–15.5)
WBC: 6 10*3/uL (ref 4.0–10.5)

## 2011-03-15 LAB — POCT I-STAT, CHEM 8
BUN: 22 mg/dL (ref 6–23)
Calcium, Ion: 1.18 mmol/L (ref 1.12–1.32)
Chloride: 102 mEq/L (ref 96–112)
Glucose, Bld: 101 mg/dL — ABNORMAL HIGH (ref 70–99)
HCT: 31 % — ABNORMAL LOW (ref 36.0–46.0)

## 2011-03-15 LAB — DIFFERENTIAL
Basophils Absolute: 0 10*3/uL (ref 0.0–0.1)
Eosinophils Relative: 8 % — ABNORMAL HIGH (ref 0–5)
Lymphocytes Relative: 17 % (ref 12–46)
Monocytes Absolute: 0.4 10*3/uL (ref 0.1–1.0)

## 2011-03-15 MED ORDER — IOHEXOL 350 MG/ML SOLN
80.0000 mL | Freq: Once | INTRAVENOUS | Status: AC | PRN
  Administered 2011-03-15: 80 mL via INTRAVENOUS

## 2011-03-15 MED ORDER — POTASSIUM CHLORIDE CRYS ER 20 MEQ PO TBCR
40.0000 meq | EXTENDED_RELEASE_TABLET | Freq: Once | ORAL | Status: AC
  Administered 2011-03-15: 40 meq via ORAL
  Filled 2011-03-15: qty 2

## 2011-03-15 NOTE — ED Notes (Signed)
Family at bedside. 

## 2011-03-15 NOTE — ED Notes (Signed)
C/o SOB, CP since yesterday & upper back pain.

## 2011-03-15 NOTE — ED Notes (Signed)
Reports SOB on exertion, +orthopnea, denies fever, cold, cough. C/o episodes palpitations, denies preseently, denies CP presently. Reports NTG given PTA helped with CP. Also had cardiac cath 4 wks ago which was clean

## 2011-03-15 NOTE — ED Notes (Signed)
Patient is resting comfortably. 

## 2011-03-15 NOTE — ED Notes (Signed)
ekg has been done and given to dr. Shela Commons.

## 2011-03-15 NOTE — ED Provider Notes (Signed)
History     CSN: [id number] Arrival date & time: 03/15/2011  5:09 PM   First MD Initiated Contact with Patient 03/15/11 1722      Chief Complaint  Patient presents with  . Shortness of Breath  . Chest Pain    (Consider location/radiation/quality/duration/timing/severity/associated sxs/prior treatment) HPI Complains of shortness of breath onset yesterday morning and tingling in both hands. Has also had some "skipped heartbeats. "Skipped heartbeats were earlier today coming about once every 10 beats does not feel skipped heartbeats and presently . Shortness of breath feels like congestive heart failure she's had in the past. Patient reports had cardiac catheterization for which ago which was normal. Presently complains of dyspnea no other complaint. No chest pain or discomfort nothing makes symptoms better or worse. Treated with 4 baby aspirin prior to coming here. EMS treated her with 2 sublingual nitroglycerin with partial relief normalization Past Medical History  Diagnosis Date  . MI, old   . Coronary artery disease   . Hypertension   . CHF (congestive heart failure)   . Thyroid disease   . Hyperlipidemia     No past surgical history on file.  No family history on file.  History  Substance Use Topics  . Smoking status: Not on file  . Smokeless tobacco: Not on file  . Alcohol Use:     OB History    Grav Para Term Preterm Abortions TAB SAB Ect Mult Living                  Review of Systems  Constitutional: Negative.   HENT: Negative.   Respiratory: Negative.   Cardiovascular: Negative.   Gastrointestinal: Negative.   Musculoskeletal: Negative.   Skin: Negative.   Neurological: Negative.        Tingling in hands  Hematological: Negative.   Psychiatric/Behavioral: Negative.     Allergies  Review of patient's allergies indicates no known allergies.  Home Medications   Current Outpatient Rx  Name Route Sig Dispense Refill  . ASPIRIN 325 MG PO TBEC Oral  Take 325 mg by mouth once.      Marland Kitchen NITROGLYCERIN 0.4 MG/SPRAY TL SOLN Sublingual Place 2 sprays under the tongue every 5 (five) minutes as needed.        SpO2 100%  Physical Exam  Constitutional: She appears well-developed and well-nourished.  HENT:  Head: Normocephalic and atraumatic.  Eyes: Conjunctivae are normal. Pupils are equal, round, and reactive to light.  Neck: Neck supple. No tracheal deviation present. No thyromegaly present.  Cardiovascular: Normal rate and regular rhythm.   No murmur heard. Pulmonary/Chest: Effort normal and breath sounds normal. No respiratory distress. She has no rales.  Abdominal: Soft. Bowel sounds are normal. She exhibits no distension. There is no tenderness.  Musculoskeletal: Normal range of motion. She exhibits no edema and no tenderness.  Neurological: She is alert. Coordination normal.  Skin: Skin is warm and dry. No rash noted.  Psychiatric: She has a normal mood and affect.    Date: 03/15/2011  Rate: 70  Rhythm: normal sinus rhythm  QRS Axis: normal  Intervals: normal  ST/T Wave abnormalities: nonspecific ST changes  Conduction Disutrbances:none  Narrative Interpretation:   Old EKG Reviewed: changes noted Normalization on lateral ischemic twaves seen opn tracing from [id number].[id number] ED Course  Procedures (including critical care time)  Labs Reviewed - No data to display No results found. Results for orders placed during the hospital encounter of 03/15/11  PRO B NATRIURETIC PEPTIDE  Component Value Range   BNP, POC 155.7 (*) 0 - 125 (pg/mL)  TROPONIN I      Component Value Range   Troponin I <0.30  <0.30 (ng/mL)  CBC      Component Value Range   WBC 6.0  4.0 - 10.5 (K/uL)   RBC 3.69 (*) 3.87 - 5.11 (MIL/uL)   Hemoglobin 11.4 (*) 12.0 - 15.0 (g/dL)   HCT 54.0 (*) 98.1 - 46.0 (%)   MCV 89.7  78.0 - 100.0 (fL)   MCH 30.9  26.0 - 34.0 (pg)   MCHC 34.4  30.0 - 36.0 (g/dL)   RDW 19.1  47.8 - 29.5 (%)   Platelets 168  150 - 400  (K/uL)  DIFFERENTIAL      Component Value Range   Neutrophils Relative 67  43 - 77 (%)   Neutro Abs 4.0  1.7 - 7.7 (K/uL)   Lymphocytes Relative 17  12 - 46 (%)   Lymphs Abs 1.0  0.7 - 4.0 (K/uL)   Monocytes Relative 7  3 - 12 (%)   Monocytes Absolute 0.4  0.1 - 1.0 (K/uL)   Eosinophils Relative 8 (*) 0 - 5 (%)   Eosinophils Absolute 0.5  0.0 - 0.7 (K/uL)   Basophils Relative 1  0 - 1 (%)   Basophils Absolute 0.0  0.0 - 0.1 (K/uL)  D-DIMER, QUANTITATIVE      Component Value Range   D-Dimer, Quant 0.59 (*) 0.00 - 0.48 (ug/mL-FEU)  POCT I-STAT, CHEM 8      Component Value Range   Sodium 142  135 - 145 (mEq/L)   Potassium 3.2 (*) 3.5 - 5.1 (mEq/L)   Chloride 102  96 - 112 (mEq/L)   BUN 22  6 - 23 (mg/dL)   Creatinine, Ser 6.21 (*) 0.50 - 1.10 (mg/dL)   Glucose, Bld 308 (*) 70 - 99 (mg/dL)   Calcium, Ion 6.57  8.46 - 1.32 (mmol/L)   TCO2 27  0 - 100 (mmol/L)   Hemoglobin 10.5 (*) 12.0 - 15.0 (g/dL)   HCT 96.2 (*) 95.2 - 46.0 (%)   Ct Angio Chest W/cm &/or Wo Cm  03/15/2011  *RADIOLOGY REPORT*  Clinical Data:  Chest pain and shortness of breath.  CT ANGIOGRAPHY CHEST WITH CONTRAST  Technique:  Multidetector CT imaging of the chest was performed using the standard protocol during bolus administration of intravenous contrast.  Multiplanar CT image reconstructions including MIPs were obtained to evaluate the vascular anatomy.  Contrast: 80mL OMNIPAQUE IOHEXOL 350 MG/ML IV SOLN  Comparison:  Portable chest obtained earlier today and chest CTA dated 06/02/2008.  Findings:  Less prominent linear and mass-like density in the left upper lobe and less prominent linear density in the right upper lobe, compatible with residual areas of scarring.  Interval linear density in the lingula, compatible with scarring or atelectasis.  Normally opacified pulmonary arteries with no pulmonary arterial filling defects seen.  No lung masses or enlarged lymph nodes. Previously noted normal sized calcified  mediastinal and bilateral hilar lymph nodes, compatible with previous granulomatous infection.  Thoracic spine degenerative changes.  Unremarkable upper abdomen.  Review of the MIP images confirms the above findings.  IMPRESSION:  1.  No pulmonary emboli or acute abnormality. 2.  Bilateral scarring and possible atelectasis.  Original Report Authenticated By: Darrol Angel, M.D.   Dg Chest Port 1 View  03/15/2011  *RADIOLOGY REPORT*  Clinical Data: Short of breath  PORTABLE CHEST - 1 VIEW  Comparison: 02/15/2011  Findings: Upper lobe densities are stable and compatible with scarring.  Mild retraction of the hila superiorly.  Negative for pneumonia.  Negative for heart failure or effusion.  IMPRESSION: Chronic lung disease with scarring in the apices.  No acute abnormality.  Original Report Authenticated By: Camelia Phenes, M.D.   Dg Chest Portable 1 View  02/15/2011  *RADIOLOGY REPORT*  Clinical Data:  Palpitations, shortness of breath, history MI  PORTABLE CHEST - 1 VIEW  Comparison: Portable exam 1215 hours compared to 06/06/2008  Findings: Upper-normal size of cardiac silhouette. Mediastinal contours and pulmonary vascularity normal. Linear scarring bilateral upper lobes, greater on left. Question underlying emphysematous changes. No infiltrate, pleural effusion, or pneumothorax. End plate spur formation thoracic spine with minimal dextroconvex scoliosis.  IMPRESSION: Emphysematous changes with bilateral upper lobe scarring. No definite acute abnormalities.  Original Report Authenticated By: Lollie Marrow, M.D.     No diagnosis found. 8:45 PM patient resting comfortably. Denies chest pain. Reports she feels like she cannot take a deep inspiration. On reexamination she is in no respiratory distress speaks in paragraphs  MDM  Case discussed with Dr.Skaines.. Patient exhibits no evidence of acute coronary syndrome and no evidence of pulmonary embolism. Negative cardiac markers after multiple hours of  symptoms. No evidence of congestive heart failure. Plan patient to call Dr.Skaines tomorrow for office followup Diagnosis #1 dyspnea #2 hypokalemia        Doug Sou, MD 03/15/11 2101

## 2011-04-23 ENCOUNTER — Telehealth (HOSPITAL_COMMUNITY): Payer: Self-pay | Admitting: *Deleted

## 2011-04-23 NOTE — Telephone Encounter (Signed)
Opened in error

## 2011-05-04 HISTORY — PX: TRANSTHORACIC ECHOCARDIOGRAM: SHX275

## 2011-05-06 ENCOUNTER — Ambulatory Visit (HOSPITAL_COMMUNITY)
Admission: RE | Admit: 2011-05-06 | Discharge: 2011-05-06 | Disposition: A | Discharge status: Home / Self Care | Payer: Self-pay | Source: Ambulatory Visit | Attending: Cardiology | Admitting: Cardiology

## 2011-05-06 DIAGNOSIS — I059 Rheumatic mitral valve disease, unspecified: Secondary | ICD-10-CM | POA: Insufficient documentation

## 2011-05-06 DIAGNOSIS — I428 Other cardiomyopathies: Secondary | ICD-10-CM | POA: Insufficient documentation

## 2011-05-06 DIAGNOSIS — I10 Essential (primary) hypertension: Secondary | ICD-10-CM | POA: Insufficient documentation

## 2011-05-06 NOTE — Progress Notes (Signed)
  Echocardiogram 2D Echocardiogram has been performed.  Cathie Beams Deneen 05/06/2011, 2:35 PM

## 2012-05-03 ENCOUNTER — Emergency Department (HOSPITAL_COMMUNITY): Payer: Self-pay

## 2012-05-03 ENCOUNTER — Encounter (HOSPITAL_COMMUNITY): Payer: Self-pay | Admitting: *Deleted

## 2012-05-03 ENCOUNTER — Emergency Department (HOSPITAL_COMMUNITY)
Admission: EM | Admit: 2012-05-03 | Discharge: 2012-05-03 | Disposition: A | Discharge status: Home / Self Care | Payer: Self-pay | Attending: Emergency Medicine | Admitting: Emergency Medicine

## 2012-05-03 DIAGNOSIS — Z7982 Long term (current) use of aspirin: Secondary | ICD-10-CM | POA: Insufficient documentation

## 2012-05-03 DIAGNOSIS — R062 Wheezing: Secondary | ICD-10-CM | POA: Insufficient documentation

## 2012-05-03 DIAGNOSIS — E785 Hyperlipidemia, unspecified: Secondary | ICD-10-CM | POA: Insufficient documentation

## 2012-05-03 DIAGNOSIS — R05 Cough: Secondary | ICD-10-CM | POA: Insufficient documentation

## 2012-05-03 DIAGNOSIS — I2510 Atherosclerotic heart disease of native coronary artery without angina pectoris: Secondary | ICD-10-CM | POA: Insufficient documentation

## 2012-05-03 DIAGNOSIS — Z8639 Personal history of other endocrine, nutritional and metabolic disease: Secondary | ICD-10-CM | POA: Insufficient documentation

## 2012-05-03 DIAGNOSIS — J988 Other specified respiratory disorders: Secondary | ICD-10-CM

## 2012-05-03 DIAGNOSIS — Z862 Personal history of diseases of the blood and blood-forming organs and certain disorders involving the immune mechanism: Secondary | ICD-10-CM | POA: Insufficient documentation

## 2012-05-03 DIAGNOSIS — I252 Old myocardial infarction: Secondary | ICD-10-CM | POA: Insufficient documentation

## 2012-05-03 DIAGNOSIS — J069 Acute upper respiratory infection, unspecified: Secondary | ICD-10-CM | POA: Insufficient documentation

## 2012-05-03 DIAGNOSIS — R059 Cough, unspecified: Secondary | ICD-10-CM | POA: Insufficient documentation

## 2012-05-03 DIAGNOSIS — I10 Essential (primary) hypertension: Secondary | ICD-10-CM | POA: Insufficient documentation

## 2012-05-03 DIAGNOSIS — Z79899 Other long term (current) drug therapy: Secondary | ICD-10-CM | POA: Insufficient documentation

## 2012-05-03 LAB — CBC WITH DIFFERENTIAL/PLATELET
Basophils Absolute: 0 10*3/uL (ref 0.0–0.1)
Basophils Relative: 1 % (ref 0–1)
Eosinophils Relative: 0 % (ref 0–5)
HCT: 31.6 % — ABNORMAL LOW (ref 36.0–46.0)
MCHC: 32.6 g/dL (ref 30.0–36.0)
MCV: 93.5 fL (ref 78.0–100.0)
Monocytes Absolute: 0.3 10*3/uL (ref 0.1–1.0)
RDW: 13.1 % (ref 11.5–15.5)

## 2012-05-03 LAB — COMPREHENSIVE METABOLIC PANEL
AST: 32 U/L (ref 0–37)
Albumin: 4 g/dL (ref 3.5–5.2)
Calcium: 9.4 mg/dL (ref 8.4–10.5)
Creatinine, Ser: 1.41 mg/dL — ABNORMAL HIGH (ref 0.50–1.10)
GFR calc non Af Amer: 41 mL/min — ABNORMAL LOW (ref >90)

## 2012-05-03 LAB — PRO B NATRIURETIC PEPTIDE: Pro B Natriuretic peptide (BNP): 139.8 pg/mL — ABNORMAL HIGH (ref 0–125)

## 2012-05-03 MED ORDER — ASPIRIN 325 MG PO TABS
325.0000 mg | ORAL_TABLET | Freq: Once | ORAL | Status: AC
  Administered 2012-05-03: 325 mg via ORAL
  Filled 2012-05-03: qty 1

## 2012-05-03 MED ORDER — ALBUTEROL SULFATE (5 MG/ML) 0.5% IN NEBU
2.5000 mg | INHALATION_SOLUTION | RESPIRATORY_TRACT | Status: AC
  Administered 2012-05-03 (×2): 2.5 mg via RESPIRATORY_TRACT
  Filled 2012-05-03 (×2): qty 0.5

## 2012-05-03 MED ORDER — PREDNISONE 20 MG PO TABS
60.0000 mg | ORAL_TABLET | Freq: Once | ORAL | Status: AC
  Administered 2012-05-03: 60 mg via ORAL
  Filled 2012-05-03: qty 3

## 2012-05-03 MED ORDER — PREDNISONE 20 MG PO TABS: ORAL_TABLET | ORAL | Status: DC

## 2012-05-03 MED ORDER — ALBUTEROL SULFATE HFA 108 (90 BASE) MCG/ACT IN AERS: 2.0000 | INHALATION_SPRAY | RESPIRATORY_TRACT | Status: DC | PRN

## 2012-05-03 MED ORDER — IPRATROPIUM BROMIDE 0.02 % IN SOLN
0.5000 mg | RESPIRATORY_TRACT | Status: AC
  Administered 2012-05-03 (×2): 0.5 mg via RESPIRATORY_TRACT
  Filled 2012-05-03 (×2): qty 2.5

## 2012-05-03 NOTE — ED Notes (Signed)
Per EMS:  Pt from home.  Started to have SOB yesterday, nonproductive cough.  Wheezing prior to 10mg  albuterol/0.5atrovent.  Rhonchi in bilateral lower fields after treatment.  Pt speaking in complete sentences, no diaphoresis.  Pt reports that she has hx of CHF and 'feels like shes drowning'

## 2012-05-03 NOTE — ED Provider Notes (Signed)
History     CSN: [id number]  Arrival date & time 05/03/12  1732   None     Chief Complaint  Patient presents with  . Shortness of Breath    HPI chief complaint: Cough. Onset: Yesterday. Location: Chest. Not improved or worsened by anything. Severity: Mild. Timing: Constant. Associated chills, fever last night, nonproductive cough, shortness of breath. Patient denies chest pain palpitations, weight gain, lower extremity edema. Regarding social history see nurse's notes. No family history of flulike illnesses. I have reviewed patient's past medical, past surgical, social history as well as medications and allergies  Past Medical History  Diagnosis Date  . MI, old   . Coronary artery disease   . Hypertension   . CHF (congestive heart failure)   . Thyroid disease   . Hyperlipidemia     Past Surgical History  Procedure Date  . Cardiac catheterization     History reviewed. No pertinent family history.  History  Substance Use Topics  . Smoking status: Never Smoker   . Smokeless tobacco: Not on file  . Alcohol Use: No    OB History    Grav Para Term Preterm Abortions TAB SAB Ect Mult Living                  Review of Systems 10 Systems reviewed and are negative for acute change except as noted in the HPI.  Allergies  Review of patient's allergies indicates no known allergies.  Home Medications   Current Outpatient Rx  Name  Route  Sig  Dispense  Refill  . ASPIRIN 81 MG PO TABS   Oral   Take 81 mg by mouth daily.          Marland Kitchen CARVEDILOL 3.125 MG PO TABS   Oral   Take 3.125 mg by mouth 2 (two) times daily with a meal.          . CLOPIDOGREL BISULFATE 75 MG PO TABS   Oral   Take 75 mg by mouth daily.           . FUROSEMIDE 40 MG PO TABS   Oral   Take 40 mg by mouth daily.           Marland Kitchen NITROGLYCERIN 0.4 MG SL SUBL   Sublingual   Place 0.4 mg under the tongue every 5 (five) minutes as needed. 2 doses given by ems          . SIMVASTATIN 20 MG PO  TABS   Oral   Take 20 mg by mouth at bedtime.             BP 110/56  Temp 100 F (37.8 C) (Oral)  Resp 17  SpO2 100%  Physical Exam  Constitutional: She is oriented to person, place, and time. She appears well-developed and well-nourished. No distress.  HENT:  Head: Normocephalic and atraumatic.  Eyes: Conjunctivae normal are normal. Right eye exhibits no discharge. Left eye exhibits no discharge. No scleral icterus.  Neck: Normal range of motion. Neck supple. No JVD present.  Cardiovascular: Normal rate, regular rhythm, normal heart sounds and intact distal pulses.   No murmur heard. Pulmonary/Chest: No accessory muscle usage. Tachypnea noted. No respiratory distress. She has no decreased breath sounds. She has wheezes in the right upper field, the right middle field, the right lower field, the left upper field, the left middle field and the left lower field. She has rhonchi in the right upper field, the right middle field, the  right lower field, the left upper field, the left middle field and the left lower field. She has no rales.  Abdominal: Soft. Bowel sounds are normal. She exhibits no distension. There is no tenderness.  Musculoskeletal: Normal range of motion. She exhibits no tenderness.  Neurological: She is alert and oriented to person, place, and time.  Skin: Skin is warm and dry. She is not diaphoretic.  Psychiatric: She has a normal mood and affect.    ED Course  Procedures (including critical care time)  Labs Reviewed  CBC WITH DIFFERENTIAL - Abnormal; Notable for the following:    WBC 2.3 (*)     RBC 3.38 (*)     Hemoglobin 10.3 (*)     HCT 31.6 (*)     Platelets 121 (*)     Neutro Abs 1.5 (*)     Lymphs Abs 0.5 (*)     All other components within normal limits  COMPREHENSIVE METABOLIC PANEL - Abnormal; Notable for the following:    BUN 27 (*)     Creatinine, Ser 1.41 (*)     GFR calc non Af Amer 41 (*)     GFR calc Af Amer 48 (*)     All other  components within normal limits  PRO B NATRIURETIC PEPTIDE - Abnormal; Notable for the following:    Pro B Natriuretic peptide (BNP) 139.8 (*)     All other components within normal limits  POCT I-STAT TROPONIN I   Dg Chest Port 1 View  05/03/2012  *RADIOLOGY REPORT*  Clinical Data: Shortness of breath.  History of CHF.  PORTABLE CHEST - 1 VIEW  Comparison: 03/15/2011  Findings: Heart size appears within normal limits.  There is no pleural effusion or edema identified.  No airspace consolidation identified.  Chronic interstitial coarsening is noted bilaterally.  IMPRESSION:  1.  No acute cardiopulmonary abnormalities.   Original Report Authenticated By: Signa Kell, M.D.      1. Viral upper respiratory tract infection with cough   2. Wheezing-associated respiratory infection       EKG reviewed and interpreted. Normal sinus rhythm. Normal axis. Normal intervals. No T wave inversions. No ST segment changes. Normal QT interval. MDM  Patient is a well-appearing 57 year old female with a reported history of congestive heart failure on aspirin, Plavix for a myocardial infarction presenting with 2 days of dry cough, mild shortness of breath and wheezing. EKG unremarkable. Vital stable. Afebrile. Chest x-ray unremarkable. Patient does not have pneumonia or fluid volume overload at this time. Wheezing on exam. Likely viral URI with wheezing. Given patient is well-appearing and symptoms improved with steroids and breathing treatments discharged home with albuterol and prednisone and PCP followup.        Consuello Masse, MD 05/04/12 [phone]

## 2012-05-03 NOTE — ED Provider Notes (Signed)
I saw and evaluated the patient, reviewed the resident's note and I agree with the findings including ECG and plan.  This 57 year old female complains of a couple of days of fever chills sore throat cough body aches fatigue shortness of breath wheezing but last night had a right jaw it for a couple hours and when she heart attack years ago she did have some jaw pain with that, she feels somewhat improved with a breathing treatment here but still has diffuse wheezing and scattered rhonchi without obvious crackles at the bases and no edema.  Hurman Horn, MD 05/04/12 [phone]

## 2012-05-07 ENCOUNTER — Encounter (HOSPITAL_COMMUNITY): Payer: Self-pay | Admitting: Emergency Medicine

## 2012-05-07 ENCOUNTER — Emergency Department (HOSPITAL_COMMUNITY)
Admission: EM | Admit: 2012-05-07 | Discharge: 2012-05-07 | Disposition: A | Discharge status: Home / Self Care | Payer: Self-pay | Attending: Emergency Medicine | Admitting: Emergency Medicine

## 2012-05-07 ENCOUNTER — Emergency Department (HOSPITAL_COMMUNITY): Payer: Self-pay

## 2012-05-07 DIAGNOSIS — I252 Old myocardial infarction: Secondary | ICD-10-CM | POA: Insufficient documentation

## 2012-05-07 DIAGNOSIS — I10 Essential (primary) hypertension: Secondary | ICD-10-CM | POA: Insufficient documentation

## 2012-05-07 DIAGNOSIS — E785 Hyperlipidemia, unspecified: Secondary | ICD-10-CM | POA: Insufficient documentation

## 2012-05-07 DIAGNOSIS — R112 Nausea with vomiting, unspecified: Secondary | ICD-10-CM | POA: Insufficient documentation

## 2012-05-07 DIAGNOSIS — Z87891 Personal history of nicotine dependence: Secondary | ICD-10-CM | POA: Insufficient documentation

## 2012-05-07 DIAGNOSIS — E079 Disorder of thyroid, unspecified: Secondary | ICD-10-CM | POA: Insufficient documentation

## 2012-05-07 DIAGNOSIS — I2510 Atherosclerotic heart disease of native coronary artery without angina pectoris: Secondary | ICD-10-CM | POA: Insufficient documentation

## 2012-05-07 DIAGNOSIS — Z7982 Long term (current) use of aspirin: Secondary | ICD-10-CM | POA: Insufficient documentation

## 2012-05-07 DIAGNOSIS — Z79899 Other long term (current) drug therapy: Secondary | ICD-10-CM | POA: Insufficient documentation

## 2012-05-07 DIAGNOSIS — J441 Chronic obstructive pulmonary disease with (acute) exacerbation: Secondary | ICD-10-CM | POA: Insufficient documentation

## 2012-05-07 DIAGNOSIS — I509 Heart failure, unspecified: Secondary | ICD-10-CM | POA: Insufficient documentation

## 2012-05-07 DIAGNOSIS — J449 Chronic obstructive pulmonary disease, unspecified: Secondary | ICD-10-CM

## 2012-05-07 LAB — CBC WITH DIFFERENTIAL/PLATELET
Basophils Absolute: 0 10*3/uL (ref 0.0–0.1)
Basophils Relative: 0 % (ref 0–1)
Eosinophils Absolute: 0 10*3/uL (ref 0.0–0.7)
Eosinophils Relative: 0 % (ref 0–5)
Lymphocytes Relative: 30 % (ref 12–46)
MCHC: 34.1 g/dL (ref 30.0–36.0)
MCV: 89.4 fL (ref 78.0–100.0)
Platelets: 198 10*3/uL (ref 150–400)
RDW: 12.6 % (ref 11.5–15.5)
WBC: 5 10*3/uL (ref 4.0–10.5)

## 2012-05-07 LAB — COMPREHENSIVE METABOLIC PANEL
ALT: 13 U/L (ref 0–35)
AST: 19 U/L (ref 0–37)
Albumin: 3.9 g/dL (ref 3.5–5.2)
CO2: 25 mEq/L (ref 19–32)
Calcium: 9.2 mg/dL (ref 8.4–10.5)
Sodium: 139 mEq/L (ref 135–145)
Total Protein: 7.4 g/dL (ref 6.0–8.3)

## 2012-05-07 LAB — POCT I-STAT TROPONIN I: Troponin i, poc: 0.01 ng/mL (ref 0.00–0.08)

## 2012-05-07 MED ORDER — METHYLPREDNISOLONE 4 MG PO KIT: PACK | ORAL | Status: DC

## 2012-05-07 MED ORDER — ALBUTEROL SULFATE (5 MG/ML) 0.5% IN NEBU
5.0000 mg | INHALATION_SOLUTION | Freq: Once | RESPIRATORY_TRACT | Status: AC
  Administered 2012-05-07: 5 mg via RESPIRATORY_TRACT
  Filled 2012-05-07: qty 1

## 2012-05-07 MED ORDER — IPRATROPIUM BROMIDE 0.02 % IN SOLN
0.5000 mg | Freq: Once | RESPIRATORY_TRACT | Status: AC
  Administered 2012-05-07: 0.5 mg via RESPIRATORY_TRACT
  Filled 2012-05-07: qty 2.5

## 2012-05-07 MED ORDER — PREDNISONE 20 MG PO TABS
60.0000 mg | ORAL_TABLET | Freq: Once | ORAL | Status: AC
  Administered 2012-05-07: 60 mg via ORAL
  Filled 2012-05-07: qty 3

## 2012-05-07 MED ORDER — AZITHROMYCIN 250 MG PO TABS: ORAL_TABLET | ORAL | Status: DC

## 2012-05-07 MED ORDER — ALBUTEROL (5 MG/ML) CONTINUOUS INHALATION SOLN
10.0000 mg/h | INHALATION_SOLUTION | RESPIRATORY_TRACT | Status: AC
  Administered 2012-05-07: 10 mg/h via RESPIRATORY_TRACT

## 2012-05-07 NOTE — ED Notes (Signed)
Pt states that she began having a sore throat and congestion on 12/31. Pt states she feels like she cannot breathe. That she "feels like she is drowning and that she cant sleep or eat.

## 2012-05-07 NOTE — ED Provider Notes (Signed)
History     CSN: [id number]  Arrival date & time 05/07/12  7829   First MD Initiated Contact with Patient 05/07/12 [phone]      Chief Complaint  Patient presents with  . Shortness of Breath    (Consider location/radiation/quality/duration/timing/severity/associated sxs/prior treatment) HPI  [name] is a 57 y.o. female with past medical history of ACS and CHF complaining of shortness of breath for the last 5 days. Patient denies fever, she endorses several episodes of nausea vomiting. There is a prodrome of a sore throat and she was febrile on the first day. She states she cannot eat or sleep. She denies peripheral edema. She was seen for similar 4 days ago at [facility]. At that time she was started on prednisone 40 mg daily. Cough occasionally productive.   Former smoker quit in 2004 smoked 3/4 to 1PPD for 15 years  PCP: None Cards: [facility]  Past Medical History  Diagnosis Date  . MI, old   . Coronary artery disease   . Hypertension   . CHF (congestive heart failure)   . Thyroid disease   . Hyperlipidemia     Past Surgical History  Procedure Date  . Cardiac catheterization     No family history on file.  History  Substance Use Topics  . Smoking status: Never Smoker   . Smokeless tobacco: Never Used  . Alcohol Use: No    OB History    Grav Para Term Preterm Abortions TAB SAB Ect Mult Living                  Review of Systems  Constitutional: Negative for fever.  Respiratory: Negative for shortness of breath.   Cardiovascular: Negative for chest pain.  Gastrointestinal: Negative for nausea, vomiting, abdominal pain and diarrhea.  All other systems reviewed and are negative.    Allergies  Review of patient's allergies indicates no known allergies.  Home Medications   Current Outpatient Rx  Name  Route  Sig  Dispense  Refill  . ALBUTEROL SULFATE HFA 108 (90 BASE) MCG/ACT IN AERS   Inhalation   Inhale 2 puffs into the lungs every 4 (four)  hours as needed for wheezing or shortness of breath (with spacer for 2 days, then 2 puffs every 6 hours as needed for 2 days, then just as needed. ).   1 Inhaler   1   . ASPIRIN EC 81 MG PO TBEC   Oral   Take 81 mg by mouth daily.         Marland Kitchen CARVEDILOL 6.25 MG PO TABS   Oral   Take 6.25 mg by mouth 2 (two) times daily with a meal.         . FUROSEMIDE 40 MG PO TABS   Oral   Take 40 mg by mouth daily.          Marland Kitchen LISINOPRIL 10 MG PO TABS   Oral   Take 10 mg by mouth daily.         Marland Kitchen PREDNISONE 20 MG PO TABS      2 tabs po daily x 4 days   8 tablet   0   . SIMVASTATIN 20 MG PO TABS   Oral   Take 20 mg by mouth every evening.           BP 147/74  Pulse 62  Temp 98.3 F (36.8 C) (Oral)  Resp 18  SpO2 99%  Physical Exam  Nursing note  and vitals reviewed. Constitutional: She is oriented to person, place, and time. She appears well-developed and well-nourished. No distress.  HENT:  Head: Normocephalic.  Mouth/Throat: Oropharynx is clear and moist.  Eyes: Conjunctivae normal and EOM are normal. Pupils are equal, round, and reactive to light.  Neck: Normal range of motion.  Cardiovascular: Normal rate, regular rhythm and intact distal pulses.   Pulmonary/Chest: Effort normal. No stridor. No respiratory distress. She has wheezes. She has no rales. She exhibits no tenderness.       Diffuse expiratory wheezing, no respiratory distress or rhonchi.  Abdominal: Soft. Bowel sounds are normal. She exhibits no distension and no mass. There is no tenderness. There is no rebound and no guarding.  Musculoskeletal: Normal range of motion. She exhibits no edema.       No pedal edema  Neurological: She is alert and oriented to person, place, and time.  Psychiatric: She has a normal mood and affect.    ED Course  Procedures (including critical care time)  Labs Reviewed  CBC WITH DIFFERENTIAL - Abnormal; Notable for the following:    Monocytes Relative 14 (*)     All  other components within normal limits  COMPREHENSIVE METABOLIC PANEL - Abnormal; Notable for the following:    BUN 36 (*)     Creatinine, Ser 1.41 (*)     GFR calc non Af Amer 41 (*)     GFR calc Af Amer 48 (*)     All other components within normal limits  PRO B NATRIURETIC PEPTIDE - Abnormal; Notable for the following:    Pro B Natriuretic peptide (BNP) 253.4 (*)     All other components within normal limits  POCT I-STAT TROPONIN I   Dg Chest 2 View  05/07/2012  *RADIOLOGY REPORT*  Clinical Data: Shortness of breath for 3-4 days, congestion, cough, wheezing, history coronary artery disease post MI, hypertension, CHF  CHEST - 2 VIEW  Comparison: 05/03/2012  Findings: Normal heart size, mediastinal contours, and pulmonary vascularity. Emphysematous and minimal bronchitic changes compatible with COPD. Scarring bilateral upper lobes. No acute infiltrate, pleural effusion, or pneumothorax. Diffuse osseous demineralization. Atherosclerotic calcification aorta.  IMPRESSION: COPD changes with bilateral upper lobe scarring. No acute abnormalities.   Original Report Authenticated By: Ulyses Southward, M.D.     Date: 05/07/2012  Rate: 65  Rhythm: normal sinus rhythm and premature ventricular contractions (PVC)  QRS Axis: normal  Intervals: normal  ST/T Wave abnormalities: normal  Conduction Disutrbances:none  Narrative Interpretation:   Old EKG Reviewed: changes noted recurrence of PVCs  9:49 AM cat is only being administered now secondary todelay by respiratory tech  1. COPD (chronic obstructive pulmonary disease)       MDM  Chest x-ray is consistent with COPD also physical exam indicates COPD with diffuse wheezing. I will start the patient on another Medrol dose pack and prescribe her azithromycin as well patient states that she has insurance kicking in on February 1 discussed with her the importance of obtaining a primary care physician in to manage her multiple medical  problems.        Wynetta Emery, PA-C 05/08/12 [id number]

## 2012-05-07 NOTE — ED Notes (Signed)
Pt escorted to discharge window. Verbalized understanding discharge instructions. In no acute distress.   

## 2012-05-07 NOTE — ED Notes (Signed)
Pt alert and oriented x4. Respirations even and unlabored. Bilateral rise and fall of chest. Skin warm and dry. In no acute distress. Denies needs.  

## 2012-05-07 NOTE — ED Notes (Signed)
Respiratory at bedside.

## 2012-05-07 NOTE — ED Notes (Signed)
PA at bedside.

## 2012-05-09 NOTE — ED Provider Notes (Signed)
Medical screening examination/treatment/procedure(s) were performed by non-physician practitioner and as supervising physician I was immediately available for consultation/collaboration.  Olivia Mackie, MD 05/09/12 [phone]

## 2012-06-17 ENCOUNTER — Other Ambulatory Visit: Payer: Self-pay

## 2013-01-11 ENCOUNTER — Encounter: Payer: Self-pay | Admitting: *Deleted

## 2013-01-17 ENCOUNTER — Encounter: Payer: Self-pay | Admitting: Internal Medicine

## 2013-01-18 ENCOUNTER — Ambulatory Visit (INDEPENDENT_AMBULATORY_CARE_PROVIDER_SITE_OTHER): Payer: Self-pay | Admitting: Internal Medicine

## 2013-01-18 ENCOUNTER — Encounter: Payer: Self-pay | Admitting: Internal Medicine

## 2013-01-18 VITALS — BP 110/72 | HR 72 | Ht 66.0 in | Wt 185.8 lb

## 2013-01-18 DIAGNOSIS — J4489 Other specified chronic obstructive pulmonary disease: Secondary | ICD-10-CM

## 2013-01-18 DIAGNOSIS — I5181 Takotsubo syndrome: Secondary | ICD-10-CM

## 2013-01-18 DIAGNOSIS — E785 Hyperlipidemia, unspecified: Secondary | ICD-10-CM | POA: Insufficient documentation

## 2013-01-18 DIAGNOSIS — I10 Essential (primary) hypertension: Secondary | ICD-10-CM | POA: Insufficient documentation

## 2013-01-18 DIAGNOSIS — J449 Chronic obstructive pulmonary disease, unspecified: Secondary | ICD-10-CM | POA: Insufficient documentation

## 2013-01-18 MED ORDER — LISINOPRIL 10 MG PO TABS: 10.0000 mg | ORAL_TABLET | Freq: Two times a day (BID) | ORAL | Status: DC

## 2013-01-18 MED ORDER — FUROSEMIDE 40 MG PO TABS: 40.0000 mg | ORAL_TABLET | Freq: Every day | ORAL | Status: DC

## 2013-01-18 MED ORDER — CARVEDILOL 6.25 MG PO TABS: 6.2500 mg | ORAL_TABLET | Freq: Two times a day (BID) | ORAL | Status: DC

## 2013-01-18 MED ORDER — SIMVASTATIN 20 MG PO TABS: 20.0000 mg | ORAL_TABLET | Freq: Every evening | ORAL | Status: DC

## 2013-01-18 NOTE — Progress Notes (Signed)
OFFICE NOTE  Chief Complaint:  Routine followup  Primary Care Physician: Renee Nose, MD  HPI:  [name] is a 57 year old female who was previously followed by Dr. Anne Wood with [facility] with a history of nonischemic cardiomyopathy which was diagnosed as a stress-induced cardiomyopathy by catheterization in 14-Sep-2008. EF was 30% at that time with apical ballooning and relative basilar hypercontractility consistent with a takotsubo cardiomyopathy. Her EF improved very quickly after that, after starting on appropriate medical therapy. She has been slowly having her blood pressure medicines up titrated, including carvedilol and lisinopril. She has had no significant palpitations, chest pain, or other symptoms since about this time last year when she was admitted for a second episode of heart failure also thought to be due to a stress-induced cardiomyopathy. She did have a history of alcohol use in the past, drinking about 3 or 4 beers per day, which has stopped completely, and there does not seem to be any precipitating factors for this cardiomyopathy. Recently, she was apparently diagnosed with COPD and has been taking Symbicort which helps her symptoms.  PMHx:  Past Medical History  Diagnosis Date  . MI, old     NSTEMI  . Hypertension   . CHF (congestive heart failure)   . Thyroid disease   . Hyperlipidemia   . Nonischemic cardiomyopathy     stress-induced cardiomyopahty by cath 09-14-08    Past Surgical History  Procedure Laterality Date  . Cardiac catheterization  2008/09/14    no signficant CAD. EF 30%, apical ballooning, takotsubo cardiomyopathy  . Transthoracic echocardiogram  05/2011    EF 50-55%    FAMHx:  Family History  Problem Relation Age of Onset  . Lung cancer Mother     died in 09-15-03  . COPD Father   . Heart disease Brother     CABG at age 53  . Breast cancer Maternal Grandmother     SOCHx:   reports that she quit smoking about 9 years ago. She has never  used smokeless tobacco. She reports that she does not drink alcohol or use illicit drugs.  ALLERGIES:  No Known Allergies  ROS: A comprehensive review of systems was negative except for: Respiratory: positive for dyspnea on exertion  HOME MEDS: Current Outpatient Prescriptions  Medication Sig Dispense Refill  . albuterol (PROVENTIL HFA;VENTOLIN HFA) 108 (90 BASE) MCG/ACT inhaler Inhale 2 puffs into the lungs every 4 (four) hours as needed for wheezing or shortness of breath (with spacer for 2 days, then 2 puffs every 6 hours as needed for 2 days, then just as needed. ).  1 Inhaler  1  . aspirin EC 81 MG tablet Take 81 mg by mouth daily.      . budesonide-formoterol (SYMBICORT) 160-4.5 MCG/ACT inhaler Inhale 2 puffs into the lungs 2 (two) times daily.      . carvedilol (COREG) 6.25 MG tablet Take 1 tablet (6.25 mg total) by mouth 2 (two) times daily with a meal.  60 tablet  11  . furosemide (LASIX) 40 MG tablet Take 1 tablet (40 mg total) by mouth daily.  30 tablet  11  . lisinopril (PRINIVIL,ZESTRIL) 10 MG tablet Take 1 tablet (10 mg total) by mouth 2 (two) times daily.  60 tablet  11  . simvastatin (ZOCOR) 20 MG tablet Take 1 tablet (20 mg total) by mouth every evening.  30 tablet  11   No current facility-administered medications for this visit.    LABS/IMAGING: No results found for  this or any previous visit (from the past 48 hour(s)). No results found.  VITALS: BP 110/72  Pulse 72  Ht 5\' 6"  (1.676 m)  Wt 185 lb 12.8 oz (84.278 kg)  BMI 30 kg/m2  EXAM: General appearance: alert and no distress Neck: no adenopathy, no carotid bruit, no JVD, supple, symmetrical, trachea midline and thyroid not enlarged, symmetric, no tenderness/mass/nodules Lungs: clear to auscultation bilaterally Heart: regular rate and rhythm, S1, S2 normal, no murmur, click, rub or gallop Abdomen: soft, non-tender; bowel sounds normal; no masses,  no organomegaly Extremities: extremities normal, atraumatic,  no cyanosis or edema Pulses: 2+ and symmetric Skin: Skin color, texture, turgor normal. No rashes or lesions Neurologic: Grossly normal  EKG: Normal sinus rhythm at 72  ASSESSMENT: 1. History a copy to go cardiomyopathy with a now normal EF of 50% 2. Hypertension-controlled 3. Dyslipidemia 4. COPD  PLAN: 1.   [name] has been stable from a cardiac standpoint. She denied any chest pain and has had no weight gain or other signs of worsening heart failure. She was having shortness of breath with associated wheezing and this is attributable to COPD which was recently diagnosed. With her inhaler she reports her symptoms have improved. Plan to see her back annually and will probably recheck an echocardiogram at that time.  Renee Nose, MD, [facility] Attending Cardiologist [facility]  [name] C 01/18/2013, 11:43 AM

## 2013-03-08 ENCOUNTER — Other Ambulatory Visit: Payer: Self-pay

## 2014-01-11 ENCOUNTER — Other Ambulatory Visit: Payer: Self-pay | Admitting: Internal Medicine

## 2014-01-14 NOTE — Telephone Encounter (Signed)
Rx was sent to pharmacy electronically. 

## 2014-12-21 ENCOUNTER — Other Ambulatory Visit: Payer: Self-pay | Admitting: Internal Medicine

## 2015-01-01 ENCOUNTER — Other Ambulatory Visit: Payer: Self-pay | Admitting: Internal Medicine

## 2015-01-01 NOTE — Telephone Encounter (Signed)
REFILL 

## 2015-02-05 ENCOUNTER — Other Ambulatory Visit: Payer: Self-pay | Admitting: Internal Medicine

## 2015-03-10 ENCOUNTER — Other Ambulatory Visit: Payer: Self-pay | Admitting: Internal Medicine

## 2015-05-27 ENCOUNTER — Other Ambulatory Visit: Payer: Self-pay | Admitting: Internal Medicine

## 2015-07-22 ENCOUNTER — Other Ambulatory Visit: Payer: Self-pay | Admitting: Internal Medicine

## 2015-07-23 NOTE — Telephone Encounter (Signed)
REFILL 

## 2015-12-12 ENCOUNTER — Other Ambulatory Visit: Payer: Self-pay | Admitting: *Deleted

## 2015-12-12 ENCOUNTER — Other Ambulatory Visit: Payer: Self-pay | Admitting: Internal Medicine

## 2015-12-12 NOTE — Telephone Encounter (Signed)
REFILL 

## 2015-12-23 ENCOUNTER — Ambulatory Visit: Payer: Self-pay | Attending: Internal Medicine

## 2016-01-25 ENCOUNTER — Other Ambulatory Visit: Payer: Self-pay | Admitting: Internal Medicine

## 2016-01-26 ENCOUNTER — Other Ambulatory Visit: Payer: Self-pay | Admitting: Internal Medicine

## 2016-02-04 ENCOUNTER — Other Ambulatory Visit: Payer: Self-pay | Admitting: Internal Medicine

## 2016-02-16 ENCOUNTER — Encounter: Payer: Self-pay | Admitting: Family Medicine

## 2016-02-16 ENCOUNTER — Ambulatory Visit: Payer: Self-pay | Attending: Family Medicine | Admitting: Family Medicine

## 2016-02-16 VITALS — BP 148/89 | HR 66 | Temp 98.2°F | Ht 66.0 in | Wt 193.4 lb

## 2016-02-16 DIAGNOSIS — Z87891 Personal history of nicotine dependence: Secondary | ICD-10-CM | POA: Insufficient documentation

## 2016-02-16 DIAGNOSIS — Z8249 Family history of ischemic heart disease and other diseases of the circulatory system: Secondary | ICD-10-CM | POA: Insufficient documentation

## 2016-02-16 DIAGNOSIS — Z825 Family history of asthma and other chronic lower respiratory diseases: Secondary | ICD-10-CM | POA: Insufficient documentation

## 2016-02-16 DIAGNOSIS — I10 Essential (primary) hypertension: Secondary | ICD-10-CM

## 2016-02-16 DIAGNOSIS — Z79899 Other long term (current) drug therapy: Secondary | ICD-10-CM | POA: Insufficient documentation

## 2016-02-16 DIAGNOSIS — I110 Hypertensive heart disease with heart failure: Secondary | ICD-10-CM | POA: Insufficient documentation

## 2016-02-16 DIAGNOSIS — Z7982 Long term (current) use of aspirin: Secondary | ICD-10-CM | POA: Insufficient documentation

## 2016-02-16 DIAGNOSIS — N644 Mastodynia: Secondary | ICD-10-CM | POA: Insufficient documentation

## 2016-02-16 DIAGNOSIS — I509 Heart failure, unspecified: Secondary | ICD-10-CM | POA: Insufficient documentation

## 2016-02-16 DIAGNOSIS — I252 Old myocardial infarction: Secondary | ICD-10-CM | POA: Insufficient documentation

## 2016-02-16 DIAGNOSIS — E079 Disorder of thyroid, unspecified: Secondary | ICD-10-CM | POA: Insufficient documentation

## 2016-02-16 DIAGNOSIS — J42 Unspecified chronic bronchitis: Secondary | ICD-10-CM

## 2016-02-16 DIAGNOSIS — Z23 Encounter for immunization: Secondary | ICD-10-CM

## 2016-02-16 DIAGNOSIS — Z0000 Encounter for general adult medical examination without abnormal findings: Secondary | ICD-10-CM

## 2016-02-16 DIAGNOSIS — Z801 Family history of malignant neoplasm of trachea, bronchus and lung: Secondary | ICD-10-CM | POA: Insufficient documentation

## 2016-02-16 DIAGNOSIS — E785 Hyperlipidemia, unspecified: Secondary | ICD-10-CM | POA: Insufficient documentation

## 2016-02-16 DIAGNOSIS — E782 Mixed hyperlipidemia: Secondary | ICD-10-CM

## 2016-02-16 DIAGNOSIS — Z1159 Encounter for screening for other viral diseases: Secondary | ICD-10-CM

## 2016-02-16 DIAGNOSIS — J449 Chronic obstructive pulmonary disease, unspecified: Secondary | ICD-10-CM | POA: Insufficient documentation

## 2016-02-16 LAB — CBC
HCT: 39.3 % (ref 35.0–45.0)
Hemoglobin: 13.4 g/dL (ref 11.7–15.5)
MCH: 30.7 pg (ref 27.0–33.0)
MCHC: 34.1 g/dL (ref 32.0–36.0)
MCV: 89.9 fL (ref 80.0–100.0)
MPV: 11.6 fL (ref 7.5–12.5)
PLATELETS: 248 10*3/uL (ref 140–400)
RBC: 4.37 MIL/uL (ref 3.80–5.10)
RDW: 13 % (ref 11.0–15.0)
WBC: 4.6 10*3/uL (ref 3.8–10.8)

## 2016-02-16 LAB — LIPID PANEL
CHOL/HDL RATIO: 3.1 ratio (ref <=5.0)
CHOLESTEROL: 179 mg/dL (ref 125–200)
HDL: 58 mg/dL (ref >=46)
LDL Cholesterol: 105 mg/dL (ref <130)
Triglycerides: 82 mg/dL (ref <150)
VLDL: 16 mg/dL (ref <30)

## 2016-02-16 LAB — COMPLETE METABOLIC PANEL WITH GFR
ALBUMIN: 4.1 g/dL (ref 3.6–5.1)
ALK PHOS: 67 U/L (ref 33–130)
ALT: 9 U/L (ref 6–29)
AST: 16 U/L (ref 10–35)
BILIRUBIN TOTAL: 0.6 mg/dL (ref 0.2–1.2)
BUN: 19 mg/dL (ref 7–25)
CALCIUM: 9.5 mg/dL (ref 8.6–10.4)
CHLORIDE: 105 mmol/L (ref 98–110)
CO2: 26 mmol/L (ref 20–31)
CREATININE: 1.07 mg/dL — AB (ref 0.50–1.05)
GFR, EST AFRICAN AMERICAN: 66 mL/min (ref >=60)
GFR, Est Non African American: 57 mL/min — ABNORMAL LOW (ref >=60)
Glucose, Bld: 85 mg/dL (ref 65–99)
Potassium: 4.9 mmol/L (ref 3.5–5.3)
Sodium: 140 mmol/L (ref 135–146)
TOTAL PROTEIN: 6.7 g/dL (ref 6.1–8.1)

## 2016-02-16 MED ORDER — ALBUTEROL SULFATE HFA 108 (90 BASE) MCG/ACT IN AERS: 2.0000 | INHALATION_SPRAY | RESPIRATORY_TRACT | 5 refills | Status: DC | PRN

## 2016-02-16 MED ORDER — ASPIRIN EC 81 MG PO TBEC: 81.0000 mg | DELAYED_RELEASE_TABLET | Freq: Every day | ORAL | 11 refills | Status: DC

## 2016-02-16 MED ORDER — LISINOPRIL 10 MG PO TABS: 10.0000 mg | ORAL_TABLET | Freq: Every day | ORAL | 11 refills | Status: DC

## 2016-02-16 MED ORDER — FUROSEMIDE 20 MG PO TABS
20.0000 mg | ORAL_TABLET | Freq: Every day | ORAL | 5 refills | Status: DC | PRN
Start: 2016-02-16 — End: 2016-05-20

## 2016-02-16 MED ORDER — CARVEDILOL 6.25 MG PO TABS: 6.2500 mg | ORAL_TABLET | Freq: Two times a day (BID) | ORAL | 11 refills | Status: DC

## 2016-02-16 MED ORDER — BUDESONIDE-FORMOTEROL FUMARATE 160-4.5 MCG/ACT IN AERO: 2.0000 | INHALATION_SPRAY | Freq: Two times a day (BID) | RESPIRATORY_TRACT | 3 refills | Status: DC

## 2016-02-16 MED ORDER — SIMVASTATIN 20 MG PO TABS: 20.0000 mg | ORAL_TABLET | Freq: Every evening | ORAL | 11 refills | Status: DC

## 2016-02-16 MED FILL — VENTOLIN HFA 90 MCG INHALER: 108 (90 BAS | 30 days supply | Qty: 18 | Fill #0

## 2016-02-16 MED FILL — FUROSEMIDE 20 MG TABLET: 20 | 30 days supply | Qty: 30 | Fill #0

## 2016-02-16 MED FILL — SIMVASTATIN 20 MG TABLET: 20 | 30 days supply | Qty: 30 | Fill #0

## 2016-02-16 MED FILL — CARVEDILOL 6.25 MG TABLET: 6.25 | 30 days supply | Qty: 60 | Fill #0

## 2016-02-16 MED FILL — LISINOPRIL 10 MG TABLET: 10 | 30 days supply | Qty: 30 | Fill #0

## 2016-02-16 NOTE — Assessment & Plan Note (Signed)
Hx of MI   Continue daily ASA, coreg, statin Former smoker  BP control Check lipids

## 2016-02-16 NOTE — Patient Instructions (Addendum)
[name] was seen today for establish care.  Diagnoses and all orders for this visit:  Chronic bronchitis, unspecified chronic bronchitis type ([place]) -     albuterol (PROVENTIL HFA;VENTOLIN HFA) 108 (90 Base) MCG/ACT inhaler; Inhale 2 puffs into the lungs every 4 (four) hours as needed for wheezing or shortness of breath. -     budesonide-formoterol (SYMBICORT) 160-4.5 MCG/ACT inhaler; Inhale 2 puffs into the lungs 2 (two) times daily. Via patient assistance, patient has paperwork  Essential hypertension -     carvedilol (COREG) 6.25 MG tablet; Take 1 tablet (6.25 mg total) by mouth 2 (two) times daily with a meal. -     lisinopril (PRINIVIL,ZESTRIL) 10 MG tablet; Take 1 tablet (10 mg total) by mouth daily. -     COMPLETE METABOLIC PANEL WITH GFR -     CBC -     furosemide (LASIX) 20 MG tablet; Take 1 tablet (20 mg total) by mouth daily as needed for fluid or edema.  Mixed hyperlipidemia -     simvastatin (ZOCOR) 20 MG tablet; Take 1 tablet (20 mg total) by mouth every evening. -     Lipid Panel  History of MI (myocardial infarction) -     aspirin EC 81 MG tablet; Take 1 tablet (81 mg total) by mouth daily. -     carvedilol (COREG) 6.25 MG tablet; Take 1 tablet (6.25 mg total) by mouth 2 (two) times daily with a meal. -     simvastatin (ZOCOR) 20 MG tablet; Take 1 tablet (20 mg total) by mouth every evening. -     furosemide (LASIX) 20 MG tablet; Take 1 tablet (20 mg total) by mouth daily as needed for fluid or edema.  Need for hepatitis C screening test -     Hepatitis C antibody, reflex  Pain of both breasts -     MM Digital Diagnostic Bilat; Future   F/u in 2 weeks for pap smear  Dr. Adrian Blackwater

## 2016-02-16 NOTE — Progress Notes (Signed)
LOGO@  Subjective:  Patient ID: [name], female    DOB: [birth year]  Age: 60 y.o. MRN: [id number]  CC: Establish Care   HPI [name] has hx of MI x 3, ischemic cardiomyopathy (resolved), HTN, COPD, former smoker she presents for    1. COPD: she has intermittent cough. No CP or SOB. She quit smoking in 08/28/2002. She is exposed to second hand smoke from her son.  2. HTN: takes coreg BID, lasix 2-3 times per week (reports cramps in legs when she takes it), lisinopril every other day or so. Reports that her BP is at times low and at times high on home wrist monitor. When low she feels pain in her back and lightheaded.   3. Breast pain: x 2 months. Pain in R lateral breast and L upper and lateral breast. Pain is worse with bending forward. No mass or skin changes. No fever, chills or weight loss. Wearing a sports bra or taking tylenol helps with the pain. She cannot recall her last mammogram. She denies breast injury. Her last period was at age 75. She is concerned about breast cancer. Reports breast cancer in maternal grandmother.   Past Medical History:  Diagnosis Date  . CHF (congestive heart failure) ([place])   . Hyperlipidemia   . Hypertension   . MI, old    NSTEMI  . Nonischemic cardiomyopathy ([place])    stress-induced cardiomyopahty by cath August 27, 2008  . Thyroid disease     Past Surgical History:  Procedure Laterality Date  . CARDIAC CATHETERIZATION  2008-08-27   no signficant CAD. EF 30%, apical ballooning, takotsubo cardiomyopathy  . TRANSTHORACIC ECHOCARDIOGRAM  05/2011   EF 50-55%    Family History  Problem Relation Age of Onset  . Lung cancer Mother     died in Aug 28, 2003  . COPD Father   . Heart disease Brother     CABG at age 51  . Breast cancer Maternal Grandmother     Social History  Substance Use Topics  . Smoking status: Former Smoker    Quit date: 04/03/2003  . Smokeless tobacco: Never Used  . Alcohol use No    ROS Review of Systems  Constitutional: Negative  for chills and fever.  Eyes: Negative for visual disturbance.  Respiratory: Positive for cough. Negative for shortness of breath.   Cardiovascular: Negative for chest pain.       Breast pains   Gastrointestinal: Negative for abdominal pain and blood in stool.  Musculoskeletal: Negative for arthralgias and back pain.  Skin: Negative for rash.  Allergic/Immunologic: Negative for immunocompromised state.  Hematological: Negative for adenopathy. Does not bruise/bleed easily.  Psychiatric/Behavioral: Negative for dysphoric mood and suicidal ideas.    Objective:   Today's Vitals: BP (!) 148/89 (BP Location: Left Arm, Patient Position: Sitting, Cuff Size: Small)   Pulse 66   Temp 98.2 F (36.8 C) (Oral)   Ht 5\' 6"  (1.676 m)   Wt 193 lb 6.4 oz (87.7 kg)   SpO2 98%   BMI 31.22 kg/m   Physical Exam  Constitutional: She is oriented to person, place, and time. She appears well-developed and well-nourished. No distress.  HENT:  Head: Normocephalic and atraumatic.  Cardiovascular: Normal rate, regular rhythm, normal heart sounds and intact distal pulses.   Pulmonary/Chest: Effort normal and breath sounds normal.  Musculoskeletal: She exhibits no edema.  Neurological: She is alert and oriented to person, place, and time.  Skin: Skin is warm and dry. No rash noted.  Psychiatric: She has a normal mood and affect.    Assessment & Plan:   Problem List Items Addressed This Visit      Unprioritized   Hyperlipidemia (Chronic)   Relevant Medications   aspirin EC 81 MG tablet   carvedilol (COREG) 6.25 MG tablet   lisinopril (PRINIVIL,ZESTRIL) 10 MG tablet   simvastatin (ZOCOR) 20 MG tablet   furosemide (LASIX) 20 MG tablet   Other Relevant Orders   Lipid Panel   HTN (hypertension) (Chronic)   Relevant Medications   aspirin EC 81 MG tablet   carvedilol (COREG) 6.25 MG tablet   lisinopril (PRINIVIL,ZESTRIL) 10 MG tablet   simvastatin (ZOCOR) 20 MG tablet   furosemide (LASIX) 20 MG  tablet   Other Relevant Orders   COMPLETE METABOLIC PANEL WITH GFR   CBC   COPD (chronic obstructive pulmonary disease) (HCC) - Primary (Chronic)   Relevant Medications   albuterol (PROVENTIL HFA;VENTOLIN HFA) 108 (90 Base) MCG/ACT inhaler   budesonide-formoterol (SYMBICORT) 160-4.5 MCG/ACT inhaler    Other Visit Diagnoses    History of MI (myocardial infarction)       Relevant Medications   aspirin EC 81 MG tablet   carvedilol (COREG) 6.25 MG tablet   simvastatin (ZOCOR) 20 MG tablet   furosemide (LASIX) 20 MG tablet   Need for hepatitis C screening test       Relevant Orders   Hepatitis C antibody, reflex   Pain of both breasts       Relevant Orders   MM Digital Diagnostic Bilat      Outpatient Encounter Prescriptions as of 02/16/2016  Medication Sig  . albuterol (PROVENTIL HFA;VENTOLIN HFA) 108 (90 BASE) MCG/ACT inhaler Inhale 2 puffs into the lungs every 4 (four) hours as needed for wheezing or shortness of breath (with spacer for 2 days, then 2 puffs every 6 hours as needed for 2 days, then just as needed. ).  Marland Kitchen aspirin EC 81 MG tablet Take 81 mg by mouth daily.  . budesonide-formoterol (SYMBICORT) 160-4.5 MCG/ACT inhaler Inhale 2 puffs into the lungs 2 (two) times daily.  . carvedilol (COREG) 6.25 MG tablet Take 1 tablet (6.25 mg total) by mouth 2 (two) times daily with a meal.  . furosemide (LASIX) 40 MG tablet TAKE ONE TABLET BY MOUTH TWICE DAILY. MUST HAVE OFFICE VISIT ASAP.  Marland Kitchen lisinopril (PRINIVIL,ZESTRIL) 10 MG tablet Take 1 tablet (10 mg total) by mouth 2 (two) times daily.  . simvastatin (ZOCOR) 20 MG tablet Take 1 tablet (20 mg total) by mouth every evening.   No facility-administered encounter medications on file as of 02/16/2016.     Follow-up: Return in about 2 weeks (around 03/01/2016) for pap smear .    Boykin Nearing MD

## 2016-02-16 NOTE — Assessment & Plan Note (Addendum)
A: COPD without exacerbation, former smoker P: refilled Symbicort Albuterol prn Flu shot today Pneumovax next OV

## 2016-02-16 NOTE — Assessment & Plan Note (Signed)
A: pain in both breast w/o mass or skin changes P: Diagnostic mammogram CXR

## 2016-02-16 NOTE — Assessment & Plan Note (Signed)
A: elevated BP Med: intermittently compliant P: Continue daily coreg 6.25 mg BID  Lisinopril change to 10 mg once daily  Lasix changed to 20 mg as needed only

## 2016-02-16 NOTE — Progress Notes (Signed)
Pt needs refills on all medications.  Pt is getting flu shot today.

## 2016-02-17 ENCOUNTER — Telehealth: Payer: Self-pay

## 2016-02-17 LAB — HEPATITIS C ANTIBODY: HCV Ab: NEGATIVE

## 2016-02-19 ENCOUNTER — Telehealth: Payer: Self-pay

## 2016-02-19 NOTE — Telephone Encounter (Signed)
error 

## 2016-02-19 NOTE — Telephone Encounter (Signed)
Pt was called and informed of script being ready. 

## 2016-03-11 ENCOUNTER — Ambulatory Visit (HOSPITAL_COMMUNITY)
Admission: RE | Admit: 2016-03-11 | Discharge: 2016-03-11 | Disposition: A | Discharge status: Home / Self Care | Payer: Self-pay | Source: Ambulatory Visit | Attending: Family Medicine | Admitting: Family Medicine

## 2016-03-11 DIAGNOSIS — N644 Mastodynia: Secondary | ICD-10-CM | POA: Insufficient documentation

## 2016-03-15 ENCOUNTER — Encounter: Payer: Self-pay | Admitting: Family Medicine

## 2016-03-15 ENCOUNTER — Ambulatory Visit: Payer: Self-pay | Attending: Family Medicine | Admitting: Family Medicine

## 2016-03-15 VITALS — BP 144/79 | HR 56 | Temp 98.0°F | Ht 66.0 in | Wt 190.2 lb

## 2016-03-15 DIAGNOSIS — Z01419 Encounter for gynecological examination (general) (routine) without abnormal findings: Secondary | ICD-10-CM | POA: Insufficient documentation

## 2016-03-15 DIAGNOSIS — Z803 Family history of malignant neoplasm of breast: Secondary | ICD-10-CM | POA: Insufficient documentation

## 2016-03-15 DIAGNOSIS — Z124 Encounter for screening for malignant neoplasm of cervix: Secondary | ICD-10-CM

## 2016-03-15 DIAGNOSIS — Z87891 Personal history of nicotine dependence: Secondary | ICD-10-CM | POA: Insufficient documentation

## 2016-03-15 DIAGNOSIS — N644 Mastodynia: Secondary | ICD-10-CM | POA: Insufficient documentation

## 2016-03-15 DIAGNOSIS — Z7982 Long term (current) use of aspirin: Secondary | ICD-10-CM | POA: Insufficient documentation

## 2016-03-15 DIAGNOSIS — Z0000 Encounter for general adult medical examination without abnormal findings: Secondary | ICD-10-CM

## 2016-03-15 DIAGNOSIS — I10 Essential (primary) hypertension: Secondary | ICD-10-CM | POA: Insufficient documentation

## 2016-03-15 LAB — HEMOCCULT GUIAC POC 1CARD (OFFICE): FECAL OCCULT BLD: NEGATIVE

## 2016-03-15 MED ORDER — LISINOPRIL 20 MG PO TABS: 20.0000 mg | ORAL_TABLET | Freq: Every day | ORAL | 2 refills | Status: DC

## 2016-03-15 MED FILL — LISINOPRIL 20 MG TABLET: 20 | 30 days supply | Qty: 30 | Fill #0

## 2016-03-15 NOTE — Assessment & Plan Note (Signed)
A: still elevated with slight bradycardia P: Increase lisinopril to 20 mg daily Continue coreg 6.25 mg BID continue lasix as needed for swelling

## 2016-03-15 NOTE — Progress Notes (Signed)
Subjective:  Patient ID: [name], female    DOB: 16-Dec-1955  Age: 60 y.o. MRN: [id number]  CC: Gynecologic Exam   HPI [name] presents for   1. Due for pap: denies vaginal bleeding. Denies pelvic pain.  She has not been sexually active in past 64 years since her husband died. She denies blood in stool as well. She has not had a screening colonoscopy.   2. Breast pain: x 3 months. Pain in  L upper and lateral breast. Pain in R breast resolved prior to last OV per patient. Pain is worse with bending forward. No mass or skin changes. No fever, chills or weight loss. Wearing a sports bra or taking tylenol helps with the pain. She cannot recall her last mammogram. She denies breast injury. Her last period was at age 51. She is concerned about breast cancer. Reports breast cancer in maternal grandmother.   She had a CXR that was negative for acute findings. She is still awaiting diagnostic mammogram that was ordered at last visit.   Social History  Substance Use Topics  . Smoking status: Former Smoker    Quit date: 04/03/2003  . Smokeless tobacco: Never Used  . Alcohol use No    Outpatient Medications Prior to Visit  Medication Sig Dispense Refill  . albuterol (PROVENTIL HFA;VENTOLIN HFA) 108 (90 Base) MCG/ACT inhaler Inhale 2 puffs into the lungs every 4 (four) hours as needed for wheezing or shortness of breath. 1 Inhaler 5  . aspirin EC 81 MG tablet Take 1 tablet (81 mg total) by mouth daily. 30 tablet 11  . budesonide-formoterol (SYMBICORT) 160-4.5 MCG/ACT inhaler Inhale 2 puffs into the lungs 2 (two) times daily. Via patient assistance, patient has paperwork 3 Inhaler 3  . carvedilol (COREG) 6.25 MG tablet Take 1 tablet (6.25 mg total) by mouth 2 (two) times daily with a meal. 60 tablet 11  . furosemide (LASIX) 20 MG tablet Take 1 tablet (20 mg total) by mouth daily as needed for fluid or edema. 30 tablet 5  . lisinopril (PRINIVIL,ZESTRIL) 10 MG tablet Take 1 tablet  (10 mg total) by mouth daily. 30 tablet 11  . simvastatin (ZOCOR) 20 MG tablet Take 1 tablet (20 mg total) by mouth every evening. 30 tablet 11   No facility-administered medications prior to visit.     ROS Review of Systems  Constitutional: Negative for chills and fever.  Eyes: Negative for visual disturbance.  Respiratory: Positive for cough. Negative for shortness of breath.   Cardiovascular: Negative for chest pain.       Breast pains   Gastrointestinal: Negative for abdominal pain and blood in stool.  Musculoskeletal: Negative for arthralgias and back pain.  Skin: Negative for rash.  Allergic/Immunologic: Negative for immunocompromised state.  Hematological: Negative for adenopathy. Does not bruise/bleed easily.  Psychiatric/Behavioral: Negative for dysphoric mood and suicidal ideas.    Objective:  BP (!) 144/79 (BP Location: Left Arm, Patient Position: Sitting, Cuff Size: Small)   Pulse (!) 56   Temp 98 F (36.7 C) (Oral)   Ht 5\' 6"  (1.676 m)   Wt 190 lb 3.2 oz (86.3 kg)   SpO2 99%   BMI 30.70 kg/m   BP/Weight 03/15/2016 02/16/2016 [id number]  Systolic BP 123456 123456 [id number]  Diastolic BP 79 89 72  Wt. (Lbs) 190.2 193.4 185.8  BMI 30.7 31.22 30    Physical Exam  Constitutional: She appears well-developed and well-nourished. No distress.  Cardiovascular: Normal rate, regular rhythm, normal  heart sounds and intact distal pulses.   Pulmonary/Chest: Effort normal and breath sounds normal.  Genitourinary: Vagina normal and uterus normal. Pelvic exam was performed with patient prone. There is no rash, tenderness or lesion on the right labia. There is no rash, tenderness or lesion on the left labia. Cervix exhibits no motion tenderness, no discharge and no friability.  Musculoskeletal: She exhibits no edema.  Lymphadenopathy:       Right: No inguinal adenopathy present.       Left: No inguinal adenopathy present.  Skin: Skin is warm and dry. No rash noted.     Assessment &  Plan:  Shir was seen today for gynecologic exam.  Diagnoses and all orders for this visit:  Pap smear for cervical cancer screening -     Cytology - PAP  Essential hypertension -     lisinopril (PRINIVIL,ZESTRIL) 20 MG tablet; Take 1 tablet (20 mg total) by mouth daily.  Healthcare maintenance -     Hemoccult - 1 Card (office) -     Ambulatory referral to Gastroenterology   There are no diagnoses linked to this encounter.  No orders of the defined types were placed in this encounter.   Follow-up: No Follow-up on file.   Boykin Nearing MD

## 2016-03-15 NOTE — Patient Instructions (Addendum)
[name] was seen today for gynecologic exam.  Diagnoses and all orders for this visit:  Pap smear for cervical cancer screening -     Cytology - PAP  Essential hypertension -     lisinopril (PRINIVIL,ZESTRIL) 20 MG tablet; Take 1 tablet (20 mg total) by mouth daily.  Healthcare maintenance -     Hemoccult - 1 Card (office) -     Ambulatory referral to Gastroenterology    Your stool card was negative today, but this does not replace the screening colonoscpy, Gi referral placed Please increase lisinopril from 10 to 20 mg daily  F/u in 3-4 weeks for BP check, with clinical pharmacologist   Dr. Adrian Blackwater

## 2016-03-16 LAB — CYTOLOGY - PAP
Chlamydia: NEGATIVE
Diagnosis: NEGATIVE
HPV (WINDOPATH): NOT DETECTED
NEISSERIA GONORRHEA: NEGATIVE
Trichomonas: NEGATIVE

## 2016-03-17 LAB — CERVICOVAGINAL ANCILLARY ONLY: Candida vaginitis: NEGATIVE

## 2016-03-18 ENCOUNTER — Encounter: Payer: Self-pay | Admitting: Family Medicine

## 2016-03-22 ENCOUNTER — Ambulatory Visit: Payer: Self-pay | Attending: Internal Medicine

## 2016-03-24 ENCOUNTER — Telehealth: Payer: Self-pay | Admitting: Family Medicine

## 2016-03-24 NOTE — Telephone Encounter (Signed)
Called and left message for patient to inform her that she has a prescription ready for pick up. I informed her that she needs to pick it up by 3pm on Monday 11/27.

## 2016-03-29 MED FILL — ?SIMVASTATIN 20 MG TABLET: 20 MG | 30 days supply | Qty: 30 | Fill #1

## 2016-03-29 MED FILL — CARVEDILOL 6.25 MG TABLET: 6.25 | 30 days supply | Qty: 60 | Fill #1

## 2016-05-02 ENCOUNTER — Observation Stay (HOSPITAL_COMMUNITY)
Admission: EM | Admit: 2016-05-02 | Discharge: 2016-05-04 | Disposition: A | Discharge status: Home / Self Care | Payer: Self-pay | Attending: Internal Medicine | Admitting: Internal Medicine

## 2016-05-02 ENCOUNTER — Encounter (HOSPITAL_COMMUNITY): Payer: Self-pay | Admitting: Oncology

## 2016-05-02 DIAGNOSIS — K921 Melena: Principal | ICD-10-CM | POA: Insufficient documentation

## 2016-05-02 DIAGNOSIS — J449 Chronic obstructive pulmonary disease, unspecified: Secondary | ICD-10-CM | POA: Insufficient documentation

## 2016-05-02 DIAGNOSIS — Z7982 Long term (current) use of aspirin: Secondary | ICD-10-CM | POA: Insufficient documentation

## 2016-05-02 DIAGNOSIS — I10 Essential (primary) hypertension: Secondary | ICD-10-CM | POA: Diagnosis present

## 2016-05-02 DIAGNOSIS — I428 Other cardiomyopathies: Secondary | ICD-10-CM | POA: Insufficient documentation

## 2016-05-02 DIAGNOSIS — Z87891 Personal history of nicotine dependence: Secondary | ICD-10-CM | POA: Insufficient documentation

## 2016-05-02 DIAGNOSIS — Z7951 Long term (current) use of inhaled steroids: Secondary | ICD-10-CM | POA: Insufficient documentation

## 2016-05-02 DIAGNOSIS — R001 Bradycardia, unspecified: Secondary | ICD-10-CM | POA: Insufficient documentation

## 2016-05-02 DIAGNOSIS — E785 Hyperlipidemia, unspecified: Secondary | ICD-10-CM | POA: Insufficient documentation

## 2016-05-02 DIAGNOSIS — I509 Heart failure, unspecified: Secondary | ICD-10-CM | POA: Insufficient documentation

## 2016-05-02 DIAGNOSIS — Z79899 Other long term (current) drug therapy: Secondary | ICD-10-CM | POA: Insufficient documentation

## 2016-05-02 DIAGNOSIS — K922 Gastrointestinal hemorrhage, unspecified: Secondary | ICD-10-CM

## 2016-05-02 DIAGNOSIS — K625 Hemorrhage of anus and rectum: Secondary | ICD-10-CM | POA: Diagnosis present

## 2016-05-02 DIAGNOSIS — I252 Old myocardial infarction: Secondary | ICD-10-CM | POA: Insufficient documentation

## 2016-05-02 DIAGNOSIS — N179 Acute kidney failure, unspecified: Secondary | ICD-10-CM | POA: Insufficient documentation

## 2016-05-02 DIAGNOSIS — I110 Hypertensive heart disease with heart failure: Secondary | ICD-10-CM | POA: Insufficient documentation

## 2016-05-02 HISTORY — DX: Takotsubo syndrome: I51.81

## 2016-05-02 LAB — CBC
HCT: 37 % (ref 36.0–46.0)
HEMOGLOBIN: 12.5 g/dL (ref 12.0–15.0)
MCH: 30.6 pg (ref 26.0–34.0)
MCHC: 33.8 g/dL (ref 30.0–36.0)
MCV: 90.5 fL (ref 78.0–100.0)
Platelets: 200 10*3/uL (ref 150–400)
RBC: 4.09 MIL/uL (ref 3.87–5.11)
RDW: 12.9 % (ref 11.5–15.5)
WBC: 5.5 10*3/uL (ref 4.0–10.5)

## 2016-05-02 LAB — COMPREHENSIVE METABOLIC PANEL
ALBUMIN: 4.2 g/dL (ref 3.5–5.0)
ALK PHOS: 78 U/L (ref 38–126)
ALT: 14 U/L (ref 14–54)
ANION GAP: 6 (ref 5–15)
AST: 20 U/L (ref 15–41)
BUN: 18 mg/dL (ref 6–20)
CALCIUM: 8.9 mg/dL (ref 8.9–10.3)
CO2: 25 mmol/L (ref 22–32)
Chloride: 104 mmol/L (ref 101–111)
Creatinine, Ser: 1.07 mg/dL — ABNORMAL HIGH (ref 0.44–1.00)
GFR calc Af Amer: >60 mL/min (ref >60)
GFR calc non Af Amer: 56 mL/min — ABNORMAL LOW (ref >60)
GLUCOSE: 113 mg/dL — AB (ref 65–99)
POTASSIUM: 4 mmol/L (ref 3.5–5.1)
SODIUM: 135 mmol/L (ref 135–145)
Total Bilirubin: 0.8 mg/dL (ref 0.3–1.2)
Total Protein: 6.7 g/dL (ref 6.5–8.1)

## 2016-05-02 NOTE — ED Triage Notes (Signed)
Pt bib [facility] from home d/t rectal bleeding that started tonight.  Per EMS pt states she felt the need to have a BM.  Pt reported that she passed a copius amount of dark red blood, did not see any stool.  No hx of GI problems.  4 mg IV Zofran given en route.

## 2016-05-02 NOTE — ED Notes (Signed)
Bed: RESA Expected date:  Expected time:  Means of arrival:  Comments: EMS 60 yo female rectal bleeding copious amounts-IV access

## 2016-05-02 NOTE — ED Provider Notes (Signed)
[facility] DEPT Provider Note   CSN: [id number] Arrival date & time: 05/02/16  2257/08/03  By signing my name below, I, Charolotte Eke, attest that this documentation has been prepared under the direction and in the presence of Varney Biles, MD. Electronically Signed: Charolotte Eke, Scribe. 05/02/16. 12:11 AM.    History   Chief Complaint Chief Complaint  Patient presents with  . Rectal Bleeding    HPI HPI Comments: [name] is a 60 y.o. female with h/o CHF brought in by ambulance, who presents to the Emergency Department complaining of acute onset dark, "almost black, burgundy" rectal bleeding that began at 9:30pm tonight. She has an associated dizziness when standing from sitting. Takes aspirin. She has not had a colonoscopy. No hx of diverticulitis or liver disease. She reports no heavey EtOH use. She does not take a lot of ibuprofen or blood thinners or plavix. She denies abdominal pain.   The history is provided by the patient. No language interpreter was used.    Past Medical History:  Diagnosis Date  . CHF (congestive heart failure) ([place])   . Hyperlipidemia   . Hypertension   . MI, old    NSTEMI  . Nonischemic cardiomyopathy ([place])    stress-induced cardiomyopahty by cath 08/03/08  . Thyroid disease     Patient Active Problem List   Diagnosis Date Noted  . Pain of both breasts 02/16/2016  . History of MI (myocardial infarction) 02/16/2016  . Broken heart syndrome 01/18/2013  . HTN (hypertension) 01/18/2013  . Hyperlipidemia 01/18/2013  . COPD (chronic obstructive pulmonary disease) ([place]) 01/18/2013    Past Surgical History:  Procedure Laterality Date  . CARDIAC CATHETERIZATION  08/03/2008   no signficant CAD. EF 30%, apical ballooning, takotsubo cardiomyopathy  . TRANSTHORACIC ECHOCARDIOGRAM  05/2011   EF 50-55%    OB History    No data available       Home Medications    Prior to Admission medications   Medication Sig Start Date End Date Taking?  Authorizing Provider  albuterol (PROVENTIL HFA;VENTOLIN HFA) 108 (90 Base) MCG/ACT inhaler Inhale 2 puffs into the lungs every 4 (four) hours as needed for wheezing or shortness of breath. 02/16/16  Yes Boykin Nearing, MD  aspirin EC 81 MG tablet Take 1 tablet (81 mg total) by mouth daily. 02/16/16  Yes Josalyn Funches, MD  budesonide-formoterol (SYMBICORT) 160-4.5 MCG/ACT inhaler Inhale 2 puffs into the lungs 2 (two) times daily. Via patient assistance, patient has paperwork 02/16/16  Yes Boykin Nearing, MD  carvedilol (COREG) 6.25 MG tablet Take 1 tablet (6.25 mg total) by mouth 2 (two) times daily with a meal. 02/16/16  Yes Josalyn Funches, MD  furosemide (LASIX) 20 MG tablet Take 1 tablet (20 mg total) by mouth daily as needed for fluid or edema. 02/16/16  Yes Josalyn Funches, MD  lisinopril (PRINIVIL,ZESTRIL) 20 MG tablet Take 1 tablet (20 mg total) by mouth daily. Patient taking differently: Take 20 mg by mouth every evening.  03/15/16  Yes Josalyn Funches, MD  ranitidine (ZANTAC) 150 MG tablet Take 150 mg by mouth daily as needed for heartburn.   Yes Historical Provider, MD  simvastatin (ZOCOR) 20 MG tablet Take 1 tablet (20 mg total) by mouth every evening. 02/16/16  Yes Boykin Nearing, MD    Family History Family History  Problem Relation Age of Onset  . Lung cancer Mother     died in 08/04/2003  . COPD Father   . Heart disease Brother  CABG at age 60  . Breast cancer Maternal Grandmother     Social History Social History  Substance Use Topics  . Smoking status: Former Smoker    Quit date: 04/03/2003  . Smokeless tobacco: Never Used  . Alcohol use No     Allergies   Patient has no known allergies.   Review of Systems Review of Systems  Gastrointestinal: Positive for anal bleeding and hematochezia. Negative for abdominal pain.  Neurological: Positive for dizziness.   A complete 10 system review of systems was obtained and all systems are negative except as noted in  the HPI and PMH.    Physical Exam Updated Vital Signs BP 156/74 (BP Location: Right Arm)   Pulse 69   Temp 98.2 F (36.8 C) (Oral)   Resp 20   SpO2 98%   Physical Exam  Constitutional: She is oriented to person, place, and time. She appears well-developed and well-nourished.  HENT:  Head: Normocephalic and atraumatic.  Cardiovascular: Normal rate, regular rhythm and normal heart sounds.   Pulmonary/Chest: Effort normal and breath sounds normal. She has no wheezes. She has no rales.  Abdominal: Soft.  BRE reveals bright red blood on GI exam. Abdomen soft.  Neurological: She is alert and oriented to person, place, and time.  Skin: Skin is warm and dry.  Psychiatric: She has a normal mood and affect.  Nursing note and vitals reviewed.    ED Treatments / Results   DIAGNOSTIC STUDIES: Oxygen Saturation is 98% on room air, normal by my interpretation.    COORDINATION OF CARE: 11:43 PM Discussed treatment plan with pt at bedside and pt agreed to plan.    Labs (all labs ordered are listed, but only abnormal results are displayed) Labs Reviewed  COMPREHENSIVE METABOLIC PANEL - Abnormal; Notable for the following:       Result Value   Glucose, Bld 113 (*)    Creatinine, Ser 1.07 (*)    GFR calc non Af Amer 56 (*)    All other components within normal limits  POC OCCULT BLOOD, ED - Abnormal; Notable for the following:    Fecal Occult Bld POSITIVE (*)    All other components within normal limits  CBC  TYPE AND SCREEN  ABO/RH    EKG  EKG Interpretation None       Radiology No results found.  Procedures Procedures (including critical care time)  Medications Ordered in ED Medications - No data to display   Initial Impression / Assessment and Plan / ED Course  I have reviewed the triage vital signs and the nursing notes.  Pertinent labs & imaging results that were available during my care of the patient were reviewed by me and considered in my medical  decision making (see chart for details).  Clinical Course     60 Y/O with aspirin use comes in with cc of bloody stools. Pt has never had GI bleed. She has bright and dark red blood per rectal exam. At some point pt had dizziness when she got up - orthostatics ordered. PT has no abd tenderness. No signs of external hemorrhoid. We will admit for suspected lower GI bleed given concomitant aspirin use.   Final Clinical Impressions(s) / ED Diagnoses   Final diagnoses:  Rectal bleeding  Lower GI bleed    New Prescriptions New Prescriptions   No medications on file   I personally performed the services described in this documentation, which was scribed in my presence. The recorded information has  been reviewed and is accurate.     Varney Biles, MD 05/03/16 0020

## 2016-05-03 ENCOUNTER — Encounter (HOSPITAL_COMMUNITY): Payer: Self-pay | Admitting: Internal Medicine

## 2016-05-03 DIAGNOSIS — K625 Hemorrhage of anus and rectum: Secondary | ICD-10-CM | POA: Diagnosis present

## 2016-05-03 DIAGNOSIS — K922 Gastrointestinal hemorrhage, unspecified: Secondary | ICD-10-CM

## 2016-05-03 DIAGNOSIS — I10 Essential (primary) hypertension: Secondary | ICD-10-CM

## 2016-05-03 HISTORY — DX: Gastrointestinal hemorrhage, unspecified: K92.2

## 2016-05-03 LAB — BASIC METABOLIC PANEL
ANION GAP: 6 (ref 5–15)
BUN: 17 mg/dL (ref 6–20)
CALCIUM: 8.7 mg/dL — AB (ref 8.9–10.3)
CO2: 25 mmol/L (ref 22–32)
CREATININE: 1.15 mg/dL — AB (ref 0.44–1.00)
Chloride: 107 mmol/L (ref 101–111)
GFR, EST AFRICAN AMERICAN: 59 mL/min — AB (ref >60)
GFR, EST NON AFRICAN AMERICAN: 51 mL/min — AB (ref >60)
Glucose, Bld: 93 mg/dL (ref 65–99)
Potassium: 4.3 mmol/L (ref 3.5–5.1)
Sodium: 138 mmol/L (ref 135–145)

## 2016-05-03 LAB — HEMOGLOBIN AND HEMATOCRIT, BLOOD
HEMATOCRIT: 31.7 % — AB (ref 36.0–46.0)
Hemoglobin: 10.4 g/dL — ABNORMAL LOW (ref 12.0–15.0)

## 2016-05-03 LAB — TYPE AND SCREEN
ABO/RH(D): O POS
Antibody Screen: NEGATIVE

## 2016-05-03 LAB — ABO/RH: ABO/RH(D): O POS

## 2016-05-03 LAB — CBC
HEMATOCRIT: 33.4 % — AB (ref 36.0–46.0)
Hemoglobin: 11.3 g/dL — ABNORMAL LOW (ref 12.0–15.0)
MCH: 30.6 pg (ref 26.0–34.0)
MCHC: 33.8 g/dL (ref 30.0–36.0)
MCV: 90.5 fL (ref 78.0–100.0)
PLATELETS: 184 10*3/uL (ref 150–400)
RBC: 3.69 MIL/uL — ABNORMAL LOW (ref 3.87–5.11)
RDW: 12.9 % (ref 11.5–15.5)
WBC: 4.5 10*3/uL (ref 4.0–10.5)

## 2016-05-03 LAB — POC OCCULT BLOOD, ED: FECAL OCCULT BLD: POSITIVE — AB

## 2016-05-03 MED ORDER — SIMVASTATIN 10 MG PO TABS
20.0000 mg | ORAL_TABLET | Freq: Every evening | ORAL | Status: DC
  Administered 2016-05-03: 20 mg via ORAL
  Filled 2016-05-03: qty 2

## 2016-05-03 MED ORDER — ALBUTEROL SULFATE (2.5 MG/3ML) 0.083% IN NEBU
2.5000 mg | INHALATION_SOLUTION | RESPIRATORY_TRACT | Status: DC | PRN
Start: 2016-05-03 — End: 2016-05-04

## 2016-05-03 MED ORDER — SODIUM CHLORIDE 0.9 % IV BOLUS (SEPSIS)
1000.0000 mL | Freq: Once | INTRAVENOUS | Status: AC
  Administered 2016-05-03: 1000 mL via INTRAVENOUS

## 2016-05-03 MED ORDER — FLUTICASONE FUROATE-VILANTEROL 200-25 MCG/INH IN AEPB
1.0000 | INHALATION_SPRAY | Freq: Every day | RESPIRATORY_TRACT | Status: DC
  Administered 2016-05-03 – 2016-05-04 (×2): 1 via RESPIRATORY_TRACT
  Filled 2016-05-03: qty 28

## 2016-05-03 MED ORDER — LISINOPRIL 20 MG PO TABS
20.0000 mg | ORAL_TABLET | Freq: Every evening | ORAL | Status: DC
  Administered 2016-05-03: 20 mg via ORAL
  Filled 2016-05-03: qty 1

## 2016-05-03 MED ORDER — SODIUM CHLORIDE 0.9 % IV SOLN
INTRAVENOUS | Status: DC
  Administered 2016-05-03: 03:00:00 via INTRAVENOUS

## 2016-05-03 MED ORDER — FAMOTIDINE 20 MG PO TABS
20.0000 mg | ORAL_TABLET | Freq: Every day | ORAL | Status: DC
  Administered 2016-05-03 – 2016-05-04 (×2): 20 mg via ORAL
  Filled 2016-05-03 (×2): qty 1

## 2016-05-03 MED ORDER — CARVEDILOL 6.25 MG PO TABS
6.2500 mg | ORAL_TABLET | Freq: Two times a day (BID) | ORAL | Status: DC
  Administered 2016-05-03 (×2): 6.25 mg via ORAL
  Filled 2016-05-03 (×3): qty 1

## 2016-05-03 NOTE — Progress Notes (Signed)
Pt seen and examined at bedside, please see earlier admission note by Dr. Alcario Drought. Pt admitted for evaluation of rectal bleed. Hg drop since admission from 12.5 --> 11.3, no bleeding reported this AM. Repeat CBC in AM or sooner if more rectal bleed. If Hg continues to drop, GI consult will be requested, for now advance diet and monitor.   Faye Ramsay, MD  [facility] Pager [phone] Cell [phone]  If 7PM-7AM, please contact night-coverage [email or web address] Password [email or web address]

## 2016-05-03 NOTE — H&P (Signed)
History and Physical    [name] [id number] DOB: Sep 15, 1955 DOA: 05/02/2016   PCP: Minerva Ends, MD Chief Complaint:  Chief Complaint  Patient presents with  . Rectal Bleeding    HPI: [name] is a 61 y.o. female with medical history significant of takotsubo cardiomyopathy with recovery of EF, HTN.  Patient presents to the ED with c/o bloody stool.  Symptoms onset at 9:30 tonight.  Associated dizziness when standing up from a seated position.  Not had colonoscopy previously, takes ASA 81 no other blood thinners.  No NSAIDS, no abd pain.  ED Course: BRBPR on evaluation.  HGB 12.5  Review of Systems: As per HPI otherwise 10 point review of systems negative.    Past Medical History:  Diagnosis Date  . CHF (congestive heart failure) ([place])   . Hyperlipidemia   . Hypertension   . Nonischemic cardiomyopathy ([place])    stress-induced cardiomyopahty by cath 2008-08-18, "no significant CAD"  . Takotsubo cardiomyopathy   . Thyroid disease     Past Surgical History:  Procedure Laterality Date  . CARDIAC CATHETERIZATION  08/18/2008   no signficant CAD. EF 30%, apical ballooning, takotsubo cardiomyopathy  . TRANSTHORACIC ECHOCARDIOGRAM  05/2011   EF 50-55%     reports that she quit smoking about 13 years ago. She has never used smokeless tobacco. She reports that she does not drink alcohol or use drugs.  No Known Allergies  Family History  Problem Relation Age of Onset  . Lung cancer Mother     died in 08-19-2003  . COPD Father   . Heart disease Brother     CABG at age 70  . Breast cancer Maternal Grandmother       Prior to Admission medications   Medication Sig Start Date End Date Taking? Authorizing Provider  albuterol (PROVENTIL HFA;VENTOLIN HFA) 108 (90 Base) MCG/ACT inhaler Inhale 2 puffs into the lungs every 4 (four) hours as needed for wheezing or shortness of breath. 02/16/16  Yes Boykin Nearing, MD  aspirin EC 81 MG tablet Take 1 tablet (81 mg total) by  mouth daily. 02/16/16  Yes Josalyn Funches, MD  budesonide-formoterol (SYMBICORT) 160-4.5 MCG/ACT inhaler Inhale 2 puffs into the lungs 2 (two) times daily. Via patient assistance, patient has paperwork 02/16/16  Yes Boykin Nearing, MD  carvedilol (COREG) 6.25 MG tablet Take 1 tablet (6.25 mg total) by mouth 2 (two) times daily with a meal. 02/16/16  Yes Josalyn Funches, MD  furosemide (LASIX) 20 MG tablet Take 1 tablet (20 mg total) by mouth daily as needed for fluid or edema. 02/16/16  Yes Josalyn Funches, MD  lisinopril (PRINIVIL,ZESTRIL) 20 MG tablet Take 1 tablet (20 mg total) by mouth daily. Patient taking differently: Take 20 mg by mouth every evening.  03/15/16  Yes Josalyn Funches, MD  ranitidine (ZANTAC) 150 MG tablet Take 150 mg by mouth daily as needed for heartburn.   Yes Historical Provider, MD  simvastatin (ZOCOR) 20 MG tablet Take 1 tablet (20 mg total) by mouth every evening. 02/16/16  Yes Boykin Nearing, MD    Physical Exam: Vitals:   05/02/16 2330 05/03/16 0000 05/03/16 0030 05/03/16 0045  BP: 133/72 125/70 109/63   Pulse: 62   71  Resp: 15 14 22 18   Temp:      TempSrc:      SpO2: 100%   97%      Constitutional: NAD, calm, comfortable Eyes: PERRL, lids and conjunctivae normal ENMT: Mucous membranes are moist. Posterior  pharynx clear of any exudate or lesions.Normal dentition.  Neck: normal, supple, no masses, no thyromegaly Respiratory: clear to auscultation bilaterally, no wheezing, no crackles. Normal respiratory effort. No accessory muscle use.  Cardiovascular: Regular rate and rhythm, no murmurs / rubs / gallops. No extremity edema. 2+ pedal pulses. No carotid bruits.  Abdomen: no tenderness, no masses palpated. No hepatosplenomegaly. Bowel sounds positive.  Musculoskeletal: no clubbing / cyanosis. No joint deformity upper and lower extremities. Good ROM, no contractures. Normal muscle tone.  Skin: no rashes, lesions, ulcers. No induration Neurologic: CN 2-12  grossly intact. Sensation intact, DTR normal. Strength 5/5 in all 4.  Psychiatric: Normal judgment and insight. Alert and oriented x 3. Normal mood.    Labs on Admission: I have personally reviewed following labs and imaging studies  CBC:  Recent Labs Lab 05/02/16 2316  WBC 5.5  HGB 12.5  HCT 37.0  MCV 90.5  PLT [id number]   Basic Metabolic Panel:  Recent Labs Lab 05/02/16 2316  NA 135  K 4.0  CL 104  CO2 25  GLUCOSE 113*  BUN 18  CREATININE 1.07*  CALCIUM 8.9   GFR: CrCl cannot be calculated (Unknown ideal weight.). Liver Function Tests:  Recent Labs Lab 05/02/16 2316  AST 20  ALT 14  ALKPHOS 78  BILITOT 0.8  PROT 6.7  ALBUMIN 4.2   No results for input(s): LIPASE, AMYLASE in the last 168 hours. No results for input(s): AMMONIA in the last 168 hours. Coagulation Profile: No results for input(s): INR, PROTIME in the last 168 hours. Cardiac Enzymes: No results for input(s): CKTOTAL, CKMB, CKMBINDEX, TROPONINI in the last 168 hours. BNP (last 3 results) No results for input(s): PROBNP in the last 8760 hours. HbA1C: No results for input(s): HGBA1C in the last 72 hours. CBG: No results for input(s): GLUCAP in the last 168 hours. Lipid Profile: No results for input(s): CHOL, HDL, LDLCALC, TRIG, CHOLHDL, LDLDIRECT in the last 72 hours. Thyroid Function Tests: No results for input(s): TSH, T4TOTAL, FREET4, T3FREE, THYROIDAB in the last 72 hours. Anemia Panel: No results for input(s): VITAMINB12, FOLATE, FERRITIN, TIBC, IRON, RETICCTPCT in the last 72 hours. Urine analysis: No results found for: COLORURINE, APPEARANCEUR, LABSPEC, PHURINE, GLUCOSEU, HGBUR, BILIRUBINUR, KETONESUR, PROTEINUR, UROBILINOGEN, NITRITE, LEUKOCYTESUR Sepsis Labs: @LABRCNTIP (procalcitonin:4,lacticidven:4) )No results found for this or any previous visit (from the past 240 hour(s)).   Radiological Exams on Admission: No results found.  EKG: Independently  reviewed.  Assessment/Plan Principal Problem:   BRBPR (bright red blood per rectum) Active Problems:   HTN (hypertension)    1. BRBPR - 1. HGB 12.5 2. Repeat CBC in AM 3. Gentle hydration with IVF 4. Clear liquid diet 5. Call GI in AM.  Never had colonoscopy before! 2. HTN - continue home meds, but will hold lasix   DVT prophylaxis: SCDs Code Status: Full Family Communication: Family at bedside Consults called: None Admission status: Admit to obs   Etta Quill DO [facility] Pager [phone] from 7PM-7AM  If 7AM-7PM, please contact the day physician for the patient [email or web address] Password [email or web address]  05/03/2016, 1:40 AM     -

## 2016-05-03 NOTE — Progress Notes (Signed)
Pt arrived to unit room 1514 via stretcher. Alert and oriented x 4, steady gait to bed, son at bedside. VSS, pain 0/10, pt oriented to room and call-bell with no complaints. Pt guide at the bedside. Will continue to monitor and intervene as appropriate.

## 2016-05-04 LAB — BASIC METABOLIC PANEL
Anion gap: 7 (ref 5–15)
BUN: 21 mg/dL — ABNORMAL HIGH (ref 6–20)
CHLORIDE: 110 mmol/L (ref 101–111)
CO2: 22 mmol/L (ref 22–32)
Calcium: 8.6 mg/dL — ABNORMAL LOW (ref 8.9–10.3)
Creatinine, Ser: 0.85 mg/dL (ref 0.44–1.00)
GFR calc non Af Amer: >60 mL/min (ref >60)
Glucose, Bld: 94 mg/dL (ref 65–99)
Potassium: 4.3 mmol/L (ref 3.5–5.1)
SODIUM: 139 mmol/L (ref 135–145)

## 2016-05-04 LAB — CBC
HCT: 33.8 % — ABNORMAL LOW (ref 36.0–46.0)
HEMOGLOBIN: 11.2 g/dL — AB (ref 12.0–15.0)
MCH: 30.4 pg (ref 26.0–34.0)
MCHC: 33.1 g/dL (ref 30.0–36.0)
MCV: 91.8 fL (ref 78.0–100.0)
Platelets: 173 10*3/uL (ref 150–400)
RBC: 3.68 MIL/uL — AB (ref 3.87–5.11)
RDW: 13 % (ref 11.5–15.5)
WBC: 4.6 10*3/uL (ref 4.0–10.5)

## 2016-05-04 NOTE — Discharge Instructions (Signed)
Lower Gastrointestinal Bleeding °Lower gastrointestinal (GI) bleeding is the result of bleeding from the colon, rectum, or anal area. The colon is the last part of the digestive tract, where stool, also called feces, is formed. If you have lower GI bleeding, you may see blood in or on your stool. It may be bright red. °Lower GI bleeding often stops without treatment. Continued or heavy bleeding needs emergency treatment at the hospital. °What are the causes? °Lower GI bleeding may be caused by: °· A condition that causes pouches to form in the colon over time (diverticulosis). °· Swelling and irritation (inflammation) in areas with diverticulosis (diverticulitis). °· Inflammation of the colon (inflammatory bowel disease). °· Swollen veins in the rectum (hemorrhoids). °· Painful tears in the anus (anal fissures), often caused by passing hard stools. °· Cancer of the colon or rectum. °· Noncancerous growths (polyps) of the colon or rectum. °· A bleeding disorder that impairs the formation of blood clots and causes easy bleeding (coagulopathy). °· An abnormal weakening of a blood vessel where an artery and a vein come together (arteriovenous malformation). °What increases the risk? °You are more likely to develop this condition if: °· You are older than 60 years of age. °· You take aspirin or NSAIDs on a regular basis. °· You take anticoagulant or antiplatelet drugs. °· You have a history of high-dose X-ray treatment (radiation therapy) of the colon. °· You recently had a colon polyp removed. °What are the signs or symptoms? °Symptoms of this condition include: °· Bright red blood or blood clots coming from your rectum. °· Bloody stools. °· Black or maroon-colored stools. °· Pain or cramping in the abdomen. °· Weakness or dizziness. °· Racing heartbeat. °How is this diagnosed? °This condition may be diagnosed based on: °· Your symptoms and medical history. °· A physical exam. During the exam, your health care provider  will check for signs of blood loss, such as low blood pressure and a rapid pulse. °· Tests, such as: °¨ Flexible sigmoidoscopy. In this procedure, a flexible tube with a camera on the end is used to examine your anus and the first part of your colon to look for the source of bleeding. °¨ Colonoscopy. This is similar to a flexible sigmoidoscopy, but the camera can extend all the way to the uppermost part of your colon. °¨ Blood tests to measure your red blood cell count and to check for coagulopathy. °¨ An imaging study of your colon to look for a bleeding site. In some cases, you may have X-rays taken after a dye or radioactive substance is injected into your bloodstream (angiogram). °How is this treated? °Treatment for this condition depends on the cause of the bleeding. Heavy or persistent bleeding is treated at the hospital. Treatment may include: °· Getting fluids through an IV tube inserted into one of your veins. °· Getting blood through an IV tube (blood transfusion). °· Stopping bleeding through high-heat coagulation, injections of certain medicines, or applying surgical clips. This can all be done during a colonoscopy. °· Having a procedure that involves first doing an angiogram and then blocking blood flow to the bleeding site (embolization). °· Stopping some of your regular medicines for a certain amount of time. °· Having surgery to remove part of the colon. This may be needed if bleeding is severe and does not respond to other treatment. °Follow these instructions at home: °· Take over-the-counter and prescription medicines only as told by your health care provider. You may need to avoid   aspirin, NSAIDs, or other medicines that increase bleeding. °· Eat foods that are high in fiber. This will help keep your stools soft. These foods include whole grains, legumes, fruits, and vegetables. Eating 1-3 prunes each day works well for many people. °· Drink enough fluid to keep your urine clear or pale  yellow. °· Keep all follow-up visits as told by your health care provider. This is important. °Contact a health care provider if: °· Your symptoms do not improve. °Get help right away if: °· Your bleeding increases. °· You feel light-headed or you faint. °· You feel weak. °· You have severe cramps in your back or abdomen. °· You pass large blood clots in your stool. °· Your symptoms get worse. °This information is not intended to replace advice given to you by your health care provider. Make sure you discuss any questions you have with your health care provider. °Document Released: 09/04/2015 Document Revised: 09/25/2015 Document Reviewed: 09/04/2015 °Elsevier Interactive Patient Education © 2017 [employer] ° °

## 2016-05-04 NOTE — Discharge Summary (Signed)
Physician Discharge Summary  [name] [id number] DOB: Jan 27, 1956 DOA: 05/02/2016  PCP: Minerva Ends, MD  Admit date: 05/02/2016 Discharge date: 05/04/2016  Recommendations for Outpatient Follow-up:  1. Pt will need to follow up with PCP in 1-2 weeks post discharge 2. Please obtain BMP to evaluate electrolytes and kidney function 3. Please also check CBC to evaluate Hg and Hct levels 4. Appointment with gastroenterologist scheduled, please see below  5. Stop taking Coreg due to bradycardia  6. Stop Lisinopril due to low BP until re evaluated by PCP  Discharge Diagnoses:  Principal Problem:   BRBPR (bright red blood per rectum) Active Problems:   HTN (hypertension)  Discharge Condition: Stable  Diet recommendation: Heart healthy diet discussed in details   History of present illness:  Pt is 61 yo female with known HTN, presented for evaluation of rectal bleed associated with weakness and dizziness.   Hospital Course:  Principal Problem:   BRBPR (bright red blood per rectum) associated with weakness and dizziness  - unclear etiology but resolved at this point - spoke with GI on call, pt is hemodynamically stable and wants to go home - this is reasonable and outpatient follow arranged - pt made aware if she experiences recurrent rectal bleed she may need to come back sooner for colonoscopy   Active Problems:   HTN (hypertension) - SBP low in 70's initially  - coreg and lisinopril stopped    Bradycardia - stop coreg for now until seen by PCP    Acute kidney injury - from acute blood loss, GI bleed - resolved with IVF    Discharge Exam: Vitals:   05/04/16 0602 05/04/16 0919  BP: 98/72 108/81  Pulse: (!) 56 (!) 55  Resp: 18 18  Temp: 98.3 F (36.8 C) 98.4 F (36.9 C)   Vitals:   05/03/16 2210 05/04/16 0115 05/04/16 0602 05/04/16 0919  BP: (!) 71/52 99/65 98/72  108/81  Pulse:   (!) 56 (!) 55  Resp:   18 18  Temp:   98.3 F (36.8 C) 98.4 F (36.9  C)  TempSrc:   Oral Oral  SpO2:   97% 99%    General: Pt is alert, follows commands appropriately, not in acute distress Cardiovascular: Regular rhythm, bradycardia, S1/S2 +, no murmurs, no rubs, no gallops Respiratory: Clear to auscultation bilaterally, no wheezing, no crackles, no rhonchi Abdominal: Soft, non tender, non distended, bowel sounds +, no guarding   Discharge Instructions  Discharge Instructions    Diet - low sodium heart healthy    Complete by:  As directed    Increase activity slowly    Complete by:  As directed      Allergies as of 05/04/2016   No Known Allergies     Medication List    STOP taking these medications   carvedilol 6.25 MG tablet Commonly known as:  COREG   lisinopril 20 MG tablet Commonly known as:  PRINIVIL,ZESTRIL     TAKE these medications   albuterol 108 (90 Base) MCG/ACT inhaler Commonly known as:  PROVENTIL HFA;VENTOLIN HFA Inhale 2 puffs into the lungs every 4 (four) hours as needed for wheezing or shortness of breath.   aspirin EC 81 MG tablet Take 1 tablet (81 mg total) by mouth daily.   budesonide-formoterol 160-4.5 MCG/ACT inhaler Commonly known as:  SYMBICORT Inhale 2 puffs into the lungs 2 (two) times daily. Via patient assistance, patient has paperwork   furosemide 20 MG tablet Commonly known as:  LASIX Take  1 tablet (20 mg total) by mouth daily as needed for fluid or edema.   ranitidine 150 MG tablet Commonly known as:  ZANTAC Take 150 mg by mouth daily as needed for heartburn.   simvastatin 20 MG tablet Commonly known as:  ZOCOR Take 1 tablet (20 mg total) by mouth every evening.      Follow-up Information    Tye Savoy, NP. Go on 05/11/2016.   Specialty:  Gastroenterology Why:  Go to Office, 3rd floor, arrive at 2:30 for your 3:00 appt with Tye Savoy, NP Contact information: [place] [address] [phone]        Minerva Ends, MD Follow up.   Specialty:  Family  Medicine Contact information: [place] [address] [phone]        Faye Ramsay, MD. Call.   Specialty:  Internal Medicine Why:  call my cell phone with questions [phone] Contact information: [address] [place] [address] [phone]          The results of significant diagnostics from this hospitalization (including imaging, microbiology, ancillary and laboratory) are listed below for reference.     Microbiology: No results found for this or any previous visit (from the past 240 hour(s)).   Labs: Basic Metabolic Panel:  Recent Labs Lab 05/02/16 2316 05/03/16 0529 05/04/16 0527  NA 135 138 139  K 4.0 4.3 4.3  CL 104 107 110  CO2 25 25 22   GLUCOSE 113* 93 94  BUN 18 17 21*  CREATININE 1.07* 1.15* 0.85  CALCIUM 8.9 8.7* 8.6*   Liver Function Tests:  Recent Labs Lab 05/02/16 2316  AST 20  ALT 14  ALKPHOS 78  BILITOT 0.8  PROT 6.7  ALBUMIN 4.2   CBC:  Recent Labs Lab 05/02/16 2316 05/03/16 0529 05/03/16 2310 05/04/16 0527  WBC 5.5 4.5  --  4.6  HGB 12.5 11.3* 10.4* 11.2*  HCT 37.0 33.4* 31.7* 33.8*  MCV 90.5 90.5  --  91.8  PLT 200 184  --  173    SIGNED: Time coordinating discharge: 30 minutes  Faye Ramsay, MD  [facility] 05/04/2016, 9:36 AM Pager [phone]  If 7PM-7AM, please contact night-coverage [email or web address] Password [email or web address]

## 2016-05-11 ENCOUNTER — Ambulatory Visit: Payer: Self-pay | Admitting: Nurse Practitioner

## 2016-05-17 ENCOUNTER — Encounter (HOSPITAL_COMMUNITY): Payer: Self-pay | Admitting: Emergency Medicine

## 2016-05-17 ENCOUNTER — Ambulatory Visit (HOSPITAL_COMMUNITY): Payer: Self-pay

## 2016-05-17 ENCOUNTER — Encounter (HOSPITAL_COMMUNITY)
Admission: EM | Disposition: A | Discharge status: Home / Self Care | Payer: Self-pay | Source: Home / Self Care | Attending: Internal Medicine

## 2016-05-17 ENCOUNTER — Inpatient Hospital Stay (HOSPITAL_COMMUNITY)
Admission: EM | Admit: 2016-05-17 | Discharge: 2016-05-20 | DRG: 287 | Disposition: A | Discharge status: Home / Self Care | Payer: Self-pay | Attending: Internal Medicine | Admitting: Internal Medicine

## 2016-05-17 DIAGNOSIS — I10 Essential (primary) hypertension: Secondary | ICD-10-CM | POA: Diagnosis present

## 2016-05-17 DIAGNOSIS — J189 Pneumonia, unspecified organism: Secondary | ICD-10-CM | POA: Diagnosis present

## 2016-05-17 DIAGNOSIS — Z7982 Long term (current) use of aspirin: Secondary | ICD-10-CM

## 2016-05-17 DIAGNOSIS — I509 Heart failure, unspecified: Secondary | ICD-10-CM | POA: Diagnosis present

## 2016-05-17 DIAGNOSIS — Z7951 Long term (current) use of inhaled steroids: Secondary | ICD-10-CM

## 2016-05-17 DIAGNOSIS — I5181 Takotsubo syndrome: Principal | ICD-10-CM | POA: Diagnosis present

## 2016-05-17 DIAGNOSIS — Z801 Family history of malignant neoplasm of trachea, bronchus and lung: Secondary | ICD-10-CM

## 2016-05-17 DIAGNOSIS — I214 Non-ST elevation (NSTEMI) myocardial infarction: Secondary | ICD-10-CM

## 2016-05-17 DIAGNOSIS — R079 Chest pain, unspecified: Secondary | ICD-10-CM | POA: Insufficient documentation

## 2016-05-17 DIAGNOSIS — Z825 Family history of asthma and other chronic lower respiratory diseases: Secondary | ICD-10-CM

## 2016-05-17 DIAGNOSIS — Z8719 Personal history of other diseases of the digestive system: Secondary | ICD-10-CM

## 2016-05-17 DIAGNOSIS — J42 Unspecified chronic bronchitis: Secondary | ICD-10-CM

## 2016-05-17 DIAGNOSIS — J181 Lobar pneumonia, unspecified organism: Secondary | ICD-10-CM

## 2016-05-17 DIAGNOSIS — E782 Mixed hyperlipidemia: Secondary | ICD-10-CM

## 2016-05-17 DIAGNOSIS — R61 Generalized hyperhidrosis: Secondary | ICD-10-CM

## 2016-05-17 DIAGNOSIS — I110 Hypertensive heart disease with heart failure: Secondary | ICD-10-CM | POA: Diagnosis present

## 2016-05-17 DIAGNOSIS — E669 Obesity, unspecified: Secondary | ICD-10-CM | POA: Diagnosis present

## 2016-05-17 DIAGNOSIS — F419 Anxiety disorder, unspecified: Secondary | ICD-10-CM | POA: Diagnosis present

## 2016-05-17 DIAGNOSIS — I2489 Other forms of acute ischemic heart disease: Secondary | ICD-10-CM | POA: Diagnosis present

## 2016-05-17 DIAGNOSIS — Z6830 Body mass index (BMI) 30.0-30.9, adult: Secondary | ICD-10-CM

## 2016-05-17 DIAGNOSIS — R7989 Other specified abnormal findings of blood chemistry: Secondary | ICD-10-CM

## 2016-05-17 DIAGNOSIS — Z87891 Personal history of nicotine dependence: Secondary | ICD-10-CM

## 2016-05-17 DIAGNOSIS — R0602 Shortness of breath: Secondary | ICD-10-CM

## 2016-05-17 DIAGNOSIS — J9811 Atelectasis: Secondary | ICD-10-CM | POA: Diagnosis present

## 2016-05-17 DIAGNOSIS — Z803 Family history of malignant neoplasm of breast: Secondary | ICD-10-CM

## 2016-05-17 DIAGNOSIS — I959 Hypotension, unspecified: Secondary | ICD-10-CM | POA: Diagnosis present

## 2016-05-17 DIAGNOSIS — E785 Hyperlipidemia, unspecified: Secondary | ICD-10-CM | POA: Diagnosis present

## 2016-05-17 DIAGNOSIS — I200 Unstable angina: Secondary | ICD-10-CM

## 2016-05-17 DIAGNOSIS — Z8249 Family history of ischemic heart disease and other diseases of the circulatory system: Secondary | ICD-10-CM

## 2016-05-17 DIAGNOSIS — R778 Other specified abnormalities of plasma proteins: Secondary | ICD-10-CM

## 2016-05-17 DIAGNOSIS — J449 Chronic obstructive pulmonary disease, unspecified: Secondary | ICD-10-CM | POA: Diagnosis present

## 2016-05-17 DIAGNOSIS — R072 Precordial pain: Secondary | ICD-10-CM

## 2016-05-17 HISTORY — DX: Gastrointestinal hemorrhage, unspecified: K92.2

## 2016-05-17 HISTORY — DX: Obesity, unspecified: E66.9

## 2016-05-17 HISTORY — DX: Tobacco use: Z72.0

## 2016-05-17 HISTORY — DX: Chronic obstructive pulmonary disease, unspecified: J44.9

## 2016-05-17 LAB — CBC WITH DIFFERENTIAL/PLATELET
BASOS ABS: 0.1 10*3/uL (ref 0.0–0.1)
BASOS PCT: 1 %
Eosinophils Absolute: 0.1 10*3/uL (ref 0.0–0.7)
Eosinophils Relative: 1 %
HCT: 39.4 % (ref 36.0–46.0)
Hemoglobin: 13.2 g/dL (ref 12.0–15.0)
Lymphocytes Relative: 12 %
Lymphs Abs: 1.4 10*3/uL (ref 0.7–4.0)
MCH: 30.3 pg (ref 26.0–34.0)
MCHC: 33.5 g/dL (ref 30.0–36.0)
MCV: 90.4 fL (ref 78.0–100.0)
MONO ABS: 0.4 10*3/uL (ref 0.1–1.0)
MONOS PCT: 3 %
NEUTROS ABS: 9.5 10*3/uL — AB (ref 1.7–7.7)
NEUTROS PCT: 83 %
Platelets: 277 10*3/uL (ref 150–400)
RBC: 4.36 MIL/uL (ref 3.87–5.11)
RDW: 13.2 % (ref 11.5–15.5)
WBC: 11.3 10*3/uL — ABNORMAL HIGH (ref 4.0–10.5)

## 2016-05-17 LAB — I-STAT CHEM 8, ED
BUN: 16 mg/dL (ref 6–20)
CALCIUM ION: 1.14 mmol/L — AB (ref 1.15–1.40)
Chloride: 106 mmol/L (ref 101–111)
Creatinine, Ser: 1 mg/dL (ref 0.44–1.00)
Glucose, Bld: 146 mg/dL — ABNORMAL HIGH (ref 65–99)
HCT: 39 % (ref 36.0–46.0)
HEMOGLOBIN: 13.3 g/dL (ref 12.0–15.0)
Potassium: 4.5 mmol/L (ref 3.5–5.1)
SODIUM: 138 mmol/L (ref 135–145)
TCO2: 21 mmol/L (ref 0–100)

## 2016-05-17 LAB — PROTIME-INR
INR: 0.97
PROTHROMBIN TIME: 12.9 s (ref 11.4–15.2)

## 2016-05-17 LAB — APTT: APTT: 30 s (ref 24–36)

## 2016-05-17 LAB — COMPREHENSIVE METABOLIC PANEL
ALT: 12 U/L — ABNORMAL LOW (ref 14–54)
ANION GAP: 11 (ref 5–15)
AST: 24 U/L (ref 15–41)
Albumin: 3.4 g/dL — ABNORMAL LOW (ref 3.5–5.0)
Alkaline Phosphatase: 76 U/L (ref 38–126)
BILIRUBIN TOTAL: 0.5 mg/dL (ref 0.3–1.2)
BUN: 14 mg/dL (ref 6–20)
CHLORIDE: 105 mmol/L (ref 101–111)
CO2: 20 mmol/L — ABNORMAL LOW (ref 22–32)
Calcium: 9 mg/dL (ref 8.9–10.3)
Creatinine, Ser: 1.02 mg/dL — ABNORMAL HIGH (ref 0.44–1.00)
GFR calc Af Amer: >60 mL/min (ref >60)
GFR, EST NON AFRICAN AMERICAN: 59 mL/min — AB (ref >60)
Glucose, Bld: 142 mg/dL — ABNORMAL HIGH (ref 65–99)
POTASSIUM: 4.5 mmol/L (ref 3.5–5.1)
Sodium: 136 mmol/L (ref 135–145)
TOTAL PROTEIN: 6.3 g/dL — AB (ref 6.5–8.1)

## 2016-05-17 LAB — TROPONIN I: TROPONIN I: 2.64 ng/mL — AB (ref <0.03)

## 2016-05-17 LAB — I-STAT TROPONIN, ED: TROPONIN I, POC: 1.85 ng/mL — AB (ref 0.00–0.08)

## 2016-05-17 SURGERY — LEFT HEART CATH AND CORONARY ANGIOGRAPHY
Anesthesia: LOCAL

## 2016-05-17 MED ORDER — NITROGLYCERIN IN D5W 200-5 MCG/ML-% IV SOLN
0.0000 ug/min | INTRAVENOUS | Status: DC
  Administered 2016-05-17: 5 ug/min via INTRAVENOUS
  Filled 2016-05-17: qty 250

## 2016-05-17 MED ORDER — IOPAMIDOL (ISOVUE-370) INJECTION 76%
INTRAVENOUS | Status: AC
  Filled 2016-05-17: qty 125

## 2016-05-17 MED ORDER — NITROGLYCERIN 1 MG/10 ML FOR IR/CATH LAB
INTRA_ARTERIAL | Status: AC
  Filled 2016-05-17: qty 10

## 2016-05-17 MED ORDER — PANTOPRAZOLE SODIUM 40 MG PO TBEC
40.0000 mg | DELAYED_RELEASE_TABLET | Freq: Two times a day (BID) | ORAL | Status: DC
  Administered 2016-05-17 – 2016-05-20 (×6): 40 mg via ORAL
  Filled 2016-05-17 (×6): qty 1

## 2016-05-17 MED ORDER — HEPARIN (PORCINE) IN NACL 100-0.45 UNIT/ML-% IJ SOLN
1050.0000 [IU]/h | INTRAMUSCULAR | Status: DC
  Filled 2016-05-17: qty 250

## 2016-05-17 MED ORDER — LEVALBUTEROL HCL 0.63 MG/3ML IN NEBU
0.6300 mg | INHALATION_SOLUTION | Freq: Once | RESPIRATORY_TRACT | Status: AC
  Administered 2016-05-17: 0.63 mg via RESPIRATORY_TRACT

## 2016-05-17 MED ORDER — HEPARIN BOLUS VIA INFUSION
4000.0000 [IU] | Freq: Once | INTRAVENOUS | Status: DC
  Filled 2016-05-17: qty 4000

## 2016-05-17 MED ORDER — CEFEPIME HCL 1 G IJ SOLR
1.0000 g | Freq: Three times a day (TID) | INTRAMUSCULAR | Status: DC
  Administered 2016-05-18 – 2016-05-19 (×3): 1 g via INTRAVENOUS
  Filled 2016-05-17 (×6): qty 1

## 2016-05-17 MED ORDER — DEXTROSE 5 % IV SOLN
0.0000 ug/kg/min | INTRAVENOUS | Status: DC
  Filled 2016-05-17: qty 2

## 2016-05-17 MED ORDER — HEPARIN BOLUS VIA INFUSION
4000.0000 [IU] | Freq: Once | INTRAVENOUS | Status: AC
  Administered 2016-05-17: 4000 [IU] via INTRAVENOUS
  Filled 2016-05-17: qty 4000

## 2016-05-17 MED ORDER — LEVALBUTEROL HCL 0.63 MG/3ML IN NEBU
INHALATION_SOLUTION | RESPIRATORY_TRACT | Status: AC
  Filled 2016-05-17: qty 3

## 2016-05-17 MED ORDER — LIDOCAINE HCL (PF) 1 % IJ SOLN
INTRAMUSCULAR | Status: AC
  Filled 2016-05-17: qty 30

## 2016-05-17 MED ORDER — HEPARIN (PORCINE) IN NACL 100-0.45 UNIT/ML-% IJ SOLN
900.0000 [IU]/h | INTRAMUSCULAR | Status: DC
  Administered 2016-05-17: 1000 [IU]/h via INTRAVENOUS
  Filled 2016-05-17: qty 250

## 2016-05-17 MED ORDER — DEXTROSE 5 % IV SOLN
1.0000 g | Freq: Three times a day (TID) | INTRAVENOUS | Status: DC
  Filled 2016-05-17 (×2): qty 1

## 2016-05-17 MED ORDER — HEPARIN (PORCINE) IN NACL 2-0.9 UNIT/ML-% IJ SOLN
INTRAMUSCULAR | Status: AC
  Filled 2016-05-17: qty 1000

## 2016-05-17 MED ORDER — VERAPAMIL HCL 2.5 MG/ML IV SOLN
INTRAVENOUS | Status: AC
  Filled 2016-05-17: qty 2

## 2016-05-17 MED ORDER — VANCOMYCIN HCL IN DEXTROSE 1-5 GM/200ML-% IV SOLN
1000.0000 mg | Freq: Two times a day (BID) | INTRAVENOUS | Status: DC
  Administered 2016-05-18 – 2016-05-19 (×4): 1000 mg via INTRAVENOUS
  Filled 2016-05-17 (×4): qty 200

## 2016-05-17 NOTE — Progress Notes (Addendum)
ANTICOAGULATION CONSULT NOTE - Initial Consult  Pharmacy Consult for Heparin Indication: chest pain/ACS  No Known Allergies  Patient Measurements: Height: 5\' 7"  (170.2 cm) Weight: 190 lb (86.2 kg) IBW/kg (Calculated) : 61.6 Heparin Dosing Weight: 80 kg  Vital Signs: Temp: 98.4 F (36.9 C) (01/15 1851) Temp Source: Oral (01/15 1851) Pulse Rate: 97 (01/15 1851)  Labs: No results for input(s): HGB, HCT, PLT, APTT, LABPROT, INR, HEPARINUNFRC, HEPRLOWMOCWT, CREATININE, CKTOTAL, CKMB, TROPONINI in the last 72 hours.  Estimated Creatinine Clearance: 80.3 mL/min (by C-G formula based on SCr of 0.85 mg/dL).   Medical History: Past Medical History:  Diagnosis Date  . CHF (congestive heart failure) ([place])   . Hyperlipidemia   . Hypertension   . Nonischemic cardiomyopathy ([place])    stress-induced cardiomyopahty by cath 2010, "no significant CAD"  . Takotsubo cardiomyopathy   . Thyroid disease     Medications:   (Not in a hospital admission) Scheduled:  Infusions:   Assessment: 61yo female with history of MI and recent GIB presents with CP. Pharmacy is consulted to dose heparin for ACS/chest pain. Will dose conservatively with recent GIB.   Goal of Therapy:  Heparin level 0.3-0.5 units/ml Monitor platelets by anticoagulation protocol: Yes   Plan:  Give 4000 units bolus x 1 Start heparin infusion at 1000 units/hr Check anti-Xa level in 6 hours and daily while on heparin Continue to monitor H&H and platelets  Andrey Cota. Diona Foley, PharmD, BCPS Clinical Pharmacist Pager [phone] 05/17/2016,6:54 PM

## 2016-05-17 NOTE — ED Notes (Signed)
Canceled code stemi  

## 2016-05-17 NOTE — H&P (Signed)
[name] [id number] DOB: Mar 06, 1956 DOA: 05/17/2016     PCP: Minerva Ends, MD   Outpatient Specialists: Cardiology skains Patient coming from:   home Lives alone,      Chief Complaint: chest pain  HPI: [name] is a 61 y.o. 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 She developed significant anxiety and shortness of breath associated chest tightness radiating to her shoulder. She checked her BP and was noted to be elevated. She took double dose of her BP meds but continued to have shortness of breath and chest tightness, no associated nausea or vomiting. she called EMS and was given 324 by aspirin as well as nitroglycerin with some improvement she required nonrebreather secondary to increased work of breathing. denies any cough or fever. She had her BP meds on hold due to recent GI bleed.  Patient had ST segment elevation and initially code STEMI was initiated. Cardiology has seen her in consult and felt that given recent negative cardiac catheterization this is more likely inconsistent with STEMI and code was canceled.   Of note patient has recently being admitted for GI bleeding likely lower GI bleeding. Her aspirin has been on hold and the plan was for her to undergo colonoscopy in 2 days. Reports no more blood per rectum no hematemesis or melena Regarding pertinent Chronic problems: Patient had had 2 prior episodes of stress-induced cardiomyopathy in 2010 as well as 2012. At times had normal coronary arteries. She has been diagnosed with Takotsubo cardiomyopathy Patient has known history of COPD  IN ER:  Temp (24hrs), Avg:98.4 F (36.9 C), Min:98.4 F (36.9 C), Max:98.4 F (36.9 C)     RR 15 satting 95% HR 99 EP 102/77 WBC 11.3 hemoglobin 13.2 which is up from 11.2  Troponin elevated 1.85 similar to prior episodes  Chest x-ray was worrisome for opacity of the right base worrisome for pneumonia Following Medications were ordered in ER: Medications  levalbuterol (XOPENEX) 0.63 MG/3ML nebulizer solution (not administered)  nitroGLYCERIN 50 mg in dextrose 5 % 250 mL (0.2 mg/mL) infusion (5 mcg/min Intravenous New Bag/Given 05/17/16 1934)  heparin bolus via infusion 4,000 Units (not administered)  heparin ADULT infusion 100 units/mL (25000 units/248mL sodium chloride 0.45%) (not administered)  pantoprazole (PROTONIX) EC tablet 40 mg (not administered)  levalbuterol (XOPENEX) nebulizer solution 0.63 mg (0.63 mg Nebulization Given 05/17/16 1902)     Hospitalist was called for admission for Chest pain evaluation associated elevated troponin  Review of Systems:    Pertinent positives include:  shortness of breath at rest. chest pain,  dyspnea on exertion,   Constitutional:  No weight loss, night sweats, Fevers, chills, fatigue, weight loss  HEENT:  No headaches, Difficulty swallowing,Tooth/dental problems,Sore throat,  No sneezing, itching, ear ache, nasal congestion, post nasal drip,  Cardio-vascular:  No Orthopnea, PND, anasarca, dizziness, palpitations.no Bilateral lower extremity swelling  GI:  No heartburn, indigestion, abdominal pain, nausea, vomiting, diarrhea, change in bowel habits, loss of appetite, melena, blood in stool, hematemesis Resp:   No excess mucus, no productive cough, No non-productive cough, No coughing up of blood.No change in color of mucus.No wheezing. Skin:  no rash or lesions. No jaundice GU:  no dysuria, change in color  of urine, no urgency or frequency. No straining to urinate.  No flank pain.  Musculoskeletal:  No joint pain or no joint swelling. No decreased range of motion. No back pain.  Psych:  No change in mood or affect. No  depression or anxiety. No memory loss.  Neuro: no localizing neurological complaints, no tingling, no weakness, no double vision, no gait abnormality, no slurred speech, no confusion  As per HPI otherwise 10 point review of systems negative.   Past Medical History: Past Medical History:  Diagnosis Date  . CHF (congestive heart failure) ([place])   . COPD (chronic obstructive pulmonary disease) ([place])   . GI bleed 05/2016  . Hyperlipidemia   . Hypertension   . Nonischemic cardiomyopathy ([place])    a. stress-induced cardiomyopathy by cath 2008/08/28 - normal cors 08/28/08 & 08/29/10.  . Obesity   . Takotsubo cardiomyopathy   . Thyroid disease   . Tobacco abuse    Past Surgical History:  Procedure Laterality Date  . CARDIAC CATHETERIZATION  2008/08/28   no signficant CAD. EF 30%, apical ballooning, takotsubo cardiomyopathy  . TRANSTHORACIC ECHOCARDIOGRAM  05/2011   EF 50-55%     Social History:  Ambulatory  independently     reports that she quit smoking about 13 years ago. She has never used smokeless tobacco. She reports that she does not drink alcohol or use drugs.  Allergies:  No Known Allergies     Family History:   Family History  Problem Relation Age of Onset  . Lung cancer Mother     died in 2003/08/29  . COPD Father   . Heart disease Brother     CABG at age 15  . Breast cancer Maternal Grandmother     Medications: Prior to Admission medications   Medication Sig Start Date End Date Taking? Authorizing Provider  albuterol (PROVENTIL HFA;VENTOLIN HFA) 108 (90 Base) MCG/ACT inhaler Inhale 2 puffs into the lungs every 4 (four) hours as needed for wheezing or shortness of breath. 02/16/16  Yes Boykin Nearing, MD  aspirin EC 81 MG tablet Take 1 tablet (81 mg total) by mouth daily. 02/16/16  Yes Josalyn Funches, MD  budesonide-formoterol (SYMBICORT) 160-4.5 MCG/ACT inhaler Inhale 2 puffs into the lungs 2 (two) times daily. Via patient assistance, patient has paperwork 02/16/16  Yes Boykin Nearing, MD  carvedilol (COREG) 6.25 MG tablet Take 6.25 mg by mouth 2 (two) times daily. 03/29/16  Yes Historical Provider, MD  furosemide (LASIX) 20 MG tablet Take 1 tablet (20 mg total) by mouth daily as needed for fluid or edema. 02/16/16  Yes Josalyn Funches, MD  lisinopril (PRINIVIL,ZESTRIL) 10 MG tablet Take 10 mg by mouth daily as needed (high blood pressure >130/100).   Yes Historical Provider, MD  ranitidine (ZANTAC) 150 MG tablet Take 150 mg by mouth daily as needed for heartburn.   Yes Historical Provider, MD  simvastatin (ZOCOR) 20 MG tablet Take 1 tablet (20 mg total) by mouth every evening. Patient taking differently: Take 20 mg by mouth at bedtime.  02/16/16  Yes Boykin Nearing, MD    Physical Exam: Patient Vitals for the past 24 hrs:  BP Temp Temp src Pulse Resp SpO2 Height Weight  05/17/16 1945 107/75 - - 97 16 98 % - -  05/17/16 1930 116/86 - - 98 20 98 % - -  05/17/16 1915 105/75 - - 92 17 100 % - -  05/17/16 1853 - - - - - - 5\' 7"  (1.702 m) 86.2 kg (190  lb)  05/17/16 1851 - 98.4 F (36.9 C) Oral 97 - 95 % - -    1. General:  in No Acute distress 2. Psychological: Alert and   Oriented 3. Head/ENT:     Dry Mucous Membranes                          Head Non traumatic, neck supple                            Poor Dentition 4. SKIN: normal  Skin turgor,  Skin clean Dry and intact no rash 5. Heart: Regular rate and rhythm no Murmur, Rub or gallop 6. Lungs:  no wheezes or crackles   7. Abdomen: Soft,  non-tender, Non distended 8. Lower extremities: no clubbing, cyanosis, or edema 9. Neurologically Grossly intact, moving all 4 extremities equally  10. MSK: Normal range of motion   body mass index is 29.76 kg/m.  Labs on Admission:   Labs on Admission: I have personally reviewed following labs and imaging studies  CBC:  Recent Labs Lab 05/17/16 1858 05/17/16 1902  WBC  --  11.3*  NEUTROABS  --  9.5*  HGB 13.3 13.2  HCT 39.0 39.4  MCV  --  90.4  PLT  --   99991111   Basic Metabolic Panel:  Recent Labs Lab 05/17/16 1858 05/17/16 1902  NA 138 136  K 4.5 4.5  CL 106 105  CO2  --  20*  GLUCOSE 146* 142*  BUN 16 14  CREATININE 1.00 1.02*  CALCIUM  --  9.0   GFR: Estimated Creatinine Clearance: 66.9 mL/min (by C-G formula based on SCr of 1.02 mg/dL (H)). Liver Function Tests:  Recent Labs Lab 05/17/16 1902  AST 24  ALT 12*  ALKPHOS 76  BILITOT 0.5  PROT 6.3*  ALBUMIN 3.4*   No results for input(s): LIPASE, AMYLASE in the last 168 hours. No results for input(s): AMMONIA in the last 168 hours. Coagulation Profile:  Recent Labs Lab 05/17/16 1902  INR 0.97   Cardiac Enzymes: No results for input(s): CKTOTAL, CKMB, CKMBINDEX, TROPONINI in the last 168 hours. BNP (last 3 results) No results for input(s): PROBNP in the last 8760 hours. HbA1C: No results for input(s): HGBA1C in the last 72 hours. CBG: No results for input(s): GLUCAP in the last 168 hours. Lipid Profile: No results for input(s): CHOL, HDL, LDLCALC, TRIG, CHOLHDL, LDLDIRECT in the last 72 hours. Thyroid Function Tests: No results for input(s): TSH, T4TOTAL, FREET4, T3FREE, THYROIDAB in the last 72 hours. Anemia Panel: No results for input(s): VITAMINB12, FOLATE, FERRITIN, TIBC, IRON, RETICCTPCT in the last 72 hours. Urine analysis: No results found for: COLORURINE, APPEARANCEUR, LABSPEC, PHURINE, GLUCOSEU, HGBUR, BILIRUBINUR, KETONESUR, PROTEINUR, UROBILINOGEN, NITRITE, LEUKOCYTESUR Sepsis Labs: @LABRCNTIP (procalcitonin:4,lacticidven:4) )No results found for this or any previous visit (from the past 240 hour(s)).    UA  not ordered  Lab Results  Component Value Date   HGBA1C 5.4 02/16/2011    Estimated Creatinine Clearance: 66.9 mL/min (by C-G formula based on SCr of 1.02 mg/dL (H)).  BNP (last 3 results) No results for input(s): PROBNP in the last 8760 hours.   ECG REPORT  Independently reviewed Rate: 95  Rhythm: sinus rhythm ST&T Change: Diffuse  ST segment that of examination with no ST depressions corresponding  [id number]  Filed Weights   05/17/16 1853  Weight: 86.2 kg (190 lb)  Cultures:    Component Value Date/Time   SDES BLOOD RIGHT LOWER ARM 06/03/2008 1910   SPECREQUEST BOTTLES DRAWN AEROBIC AND ANAEROBIC 10CC 06/03/2008 1910   CULT NO GROWTH 5 DAYS 06/03/2008 1910   REPTSTATUS 06/09/2008 FINAL 06/03/2008 1910     Radiological Exams on Admission: Dg Chest Portable 1 View  Result Date: 05/17/2016 CLINICAL DATA:  Chest pain and shortness of breath EXAM: PORTABLE CHEST 1 VIEW COMPARISON:  March 11, 2016 FINDINGS: Hazy opacity in the right base is felt to represent a degree of pneumonia. There is scarring in the left apex region, stable. Lungs elsewhere clear. Heart size and pulmonary vascularity are normal. No adenopathy. There are multiple calcified nonenlarged lymph nodes consistent with prior granulomatous disease. Atherosclerotic calcification is noted in the aorta. No lesions. IMPRESSION: Opacity right base, consistent with pneumonia. Scarring left apex, stable. Evidence of prior granulomatous disease. No enlarged nodes. Stable cardiac silhouette. There is aortic atherosclerosis. Followup PA and lateral chest radiographs recommended in 3-4 weeks following trial of antibiotic therapy to ensure resolution and exclude underlying malignancy. Electronically Signed   By: Lowella Grip III M.D.   On: 05/17/2016 19:19    Chart has been reviewed    Assessment/Plan   61 y.o. 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 Chest pain - elevated troponin has been seen by cardiology given prior history of normal cardiac  catheterization 2010 & 2012, and Takosbo  cardiomyopathy felt to be more likely secondary to recurrent event. Given rising troponin initiated heparin will obtain echogram in the morning at this point plan to hold off on cardiac catheterization. Continue to monitor cardiac enzymes admit to step down given persistent chest pain . HTN (hypertension) - currently blood pressure somewhat soft we'll continue to hold home medications restarted, radial when able . Hyperlipidemia continue statins  . HCAP (healthcare-associated pneumonia) - given recent admission on initiate antibiotic coverage for HCAP not endorsing cough will partly benefit from imaging to rule out other etiology of infiltrate . COPD (chronic obstructive pulmonary disease) ([place]) stable continue home medications  Other plan as per orders.  DVT prophylaxis: heparin  Code Status:  FULL CODE as per patient    Family Communication:   Family not  at  Bedside    Disposition Plan:       To home once workup is complete and patient is stable                                               Consults called: cardiology   Is aware  Admission status:     obs   Level of care         SDU      I have spent a total of 57 min on this admission   Braidyn Peace 05/17/2016, 9:05 PM    [facility]  Pager [phone]   after 2 AM please page floor coverage PA If 7AM-7PM, please contact the day team taking care of the patient  [email or web address]  Password [email or web address]

## 2016-05-17 NOTE — Consult Note (Signed)
Cardiology Consultation Note    Patient ID: [name], MRN: [id number], DOB/AGE: 01/21/1956 61 y.o. Admit date: 05/17/2016   Date of Consult: 05/17/2016 Primary Physician: Minerva Ends, MD Primary Cardiologist: Dr. Debara Pickett  Chief Complaint: SOB, CP Reason for Consultation: abnormal EKG Requesting MD: Dr. Aidyn Sportsman Barker  HPI: [name] is a 61 y.o. 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 She was in [place] this afternoon when she got the news that her son had had some kind of accident. She began feeling very anxious and SOB. This progressed and she developed chest tightness associated with shoulder pain. She eventually called EMS. She took 324mg  ASA at home prior to their arrival. She received 2 SL NTG with mild relief of discomfort. She remained normotensive in the 120s/70s per EDP report, pulse ox WNL but requiring NRB for shortness of breath. Upon arrival to the ED, EKG showed diffuse mild ST elevation without reciprocal changes. She also had significant wheezing. CXR shows opacity at the right base c/w PNA. Code STEMI was called. EKG did not meet STEMI criteria thus this was cancelled.   Of note she had recent admission for GI bleeding associated with hypotension and acute kidney injury with Hgb in the 11 range. She was pending outpatient colonoscopy. Her aspirin and several blood pressure medications were on hold.   Past Medical History:  Diagnosis Date  . CHF (congestive heart failure) ([place])   . COPD (chronic obstructive pulmonary disease) ([place])   . GI bleed 05/2016  . Hyperlipidemia   . Hypertension   . Nonischemic cardiomyopathy ([place])    a. stress-induced cardiomyopathy by cath 2010 - normal cors 2010 & 2012.  . Obesity   . Takotsubo cardiomyopathy   . Thyroid disease   .  Tobacco abuse       Surgical History:  Past Surgical History:  Procedure Laterality Date  . CARDIAC CATHETERIZATION  2010   no signficant CAD. EF 30%, apical ballooning, takotsubo cardiomyopathy  . TRANSTHORACIC ECHOCARDIOGRAM  05/2011   EF 50-55%     Home Meds: Prior to Admission medications   Medication Sig Start Date End Date Taking? Authorizing Provider  albuterol (PROVENTIL HFA;VENTOLIN HFA) 108 (90 Base) MCG/ACT inhaler Inhale 2 puffs into the lungs every 4 (four) hours as needed for wheezing or shortness of breath. 02/16/16   Boykin Nearing, MD  aspirin EC 81 MG tablet Take 1 tablet (81 mg total) by mouth daily. 02/16/16   Josalyn Funches, MD  budesonide-formoterol (SYMBICORT) 160-4.5 MCG/ACT inhaler Inhale 2 puffs into the lungs 2 (two) times daily. Via patient assistance, patient has paperwork 02/16/16   Boykin Nearing, MD  furosemide (LASIX) 20 MG tablet Take 1 tablet (20 mg total) by mouth daily as needed for fluid or edema. 02/16/16   Josalyn Funches, MD  ranitidine (ZANTAC) 150 MG tablet Take 150 mg by mouth daily as needed for heartburn.    Historical Provider, MD  simvastatin (ZOCOR) 20 MG tablet Take 1 tablet (20 mg total) by mouth every evening. 02/16/16   Boykin Nearing, MD    Inpatient Medications:  . levalbuterol       . nitroGLYCERIN      Allergies: No Known Allergies  Social History   Social History  . Marital status: Widowed    Spouse name: N/A  . Number  of children: 2  . Years of education: N/A   Occupational History  . Not on file.   Social History Main Topics  . Smoking status: Former Smoker    Quit date: 04/03/2003  . Smokeless tobacco: Never Used  . Alcohol use No  . Drug use: No  . Sexual activity: Not on file   Other Topics Concern  . Not on file   Social History Narrative  . No narrative on file     Family History  Problem Relation Age of Onset  . Lung cancer Mother     died in 08-26-2003  . COPD Father   . Heart disease Brother      CABG at age 78  . Breast cancer Maternal Grandmother      Review of Systems: no recent orthopnea, LEE, syncope. All other systems reviewed and are otherwise negative except as noted above.  Labs:  Lab Results  Component Value Date   WBC 4.6 05/04/2016   HGB 13.3 05/17/2016   HCT 39.0 05/17/2016   MCV 91.8 05/04/2016   PLT 173 05/04/2016    Recent Labs Lab 05/17/16 1858  NA 138  K 4.5  CL 106  BUN 16  CREATININE 1.00  GLUCOSE 146*   Lab Results  Component Value Date   CHOL 179 02/16/2016   HDL 58 02/16/2016   LDLCALC 105 02/16/2016   TRIG 82 02/16/2016   Radiology/Studies:  Dg Chest Portable 1 View  Result Date: 05/17/2016 CLINICAL DATA:  Chest pain and shortness of breath EXAM: PORTABLE CHEST 1 VIEW COMPARISON:  March 11, 2016 FINDINGS: Hazy opacity in the right base is felt to represent a degree of pneumonia. There is scarring in the left apex region, stable. Lungs elsewhere clear. Heart size and pulmonary vascularity are normal. No adenopathy. There are multiple calcified nonenlarged lymph nodes consistent with prior granulomatous disease. Atherosclerotic calcification is noted in the aorta. No lesions. IMPRESSION: Opacity right base, consistent with pneumonia. Scarring left apex, stable. Evidence of prior granulomatous disease. No enlarged nodes. Stable cardiac silhouette. There is aortic atherosclerosis. Followup PA and lateral chest radiographs recommended in 3-4 weeks following trial of antibiotic therapy to ensure resolution and exclude underlying malignancy. Electronically Signed   By: Lowella Grip III M.D.   On: 05/17/2016 19:19    Wt Readings from Last 3 Encounters:  05/17/16 190 lb (86.2 kg)  03/15/16 190 lb 3.2 oz (86.3 kg)  02/16/16 193 lb 6.4 oz (87.7 kg)   EKG: NSR 95bpm diffuse mild ST depression without reciprocal changes  Physical Exam: Pulse 97, temperature 98.4 F (36.9 C), temperature source Oral, height 5\' 7"  (1.702 m), weight 190 lb  (86.2 kg), SpO2 95 %. Body mass index is 29.76 kg/m. General: Well developed, well nourished obese WF in no acute distress. Head: Normocephalic, atraumatic, sclera non-icteric, no xanthomas, nares are without discharge.  Neck: Negative for carotid bruits. JVD not elevated. Lungs: Diffuse pronounced wheezing, no rales or rhonchi, Breathing is unlabored. Heart: RRR with S1 S2. No murmurs, rubs, or gallops appreciated. Abdomen: Soft, non-tender, non-distended with normoactive bowel sounds. No hepatomegaly. No rebound/guarding. No obvious abdominal masses. Msk:  Strength and tone appear normal for age. Extremities: No clubbing or cyanosis. No edema.  Distal pedal pulses are 2+ and equal bilaterally. Neuro: Alert and oriented X 3. No facial asymmetry. No focal deficit. Moves all extremities spontaneously. Psych:  Responds to questions appropriately with a normal affect.     Assessment and Plan  61 y.o.  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  1. Chest pain/elevated troponin - patient seen/examined with MD. At this time not felt to meet STEMI criteria. She has already received aspirin. Would recommend to continue 81mg  daily cautiously given recent GIB. Per Dr. Gwenlyn Found, with troponin elevation, would recommend to start heparin with target at low end of therapeutic range. Will obtain serial CBCs q6hour with nurse care order to call if a drop in hemoglobin is noted on subsequent Hgb. Clinical scenario may be consistent with recurrent stress-induced cardiomyopathy as symptoms correlate with onset of stressful news. There may also be a bronchospastic component. Avoid beta blocker given acute wheezing. Anticipate continuing statin if LFTs OK. May need to consider LHC if symptoms do not abate. 2D echo also ordered.  She is also being started on IV NTG per EDP.  2. COPD - possible PNA on CXR as well. Getting neb in ED. Per IM.   3. Recent GIB - see above. Further per IM. Will order Protonix for GI prophylaxis.  Signed, Charlie Pitter PA-C 05/17/2016, 7:32 PM Pager: [phone]

## 2016-05-17 NOTE — ED Triage Notes (Signed)
Pt to ED with c/o shortness of breath and slight chest pain.  Pt st's symptoms started approx 2pm today.  Pt c/o shortness of breath, bil shoulder pain and neck pain

## 2016-05-17 NOTE — ED Provider Notes (Signed)
[facility] DEPT Provider Note  CSN: [id number] Arrival date & time: 05/17/16  1847  History   Chief Complaint Chief Complaint  Patient presents with  . Code STEMI   HPI [name] is a 61 y.o. female.  The history is provided by the patient and medical records. No language interpreter was used.  Illness  This is a new problem. The current episode started 1 to 2 hours ago. The problem occurs constantly. The problem has been gradually improving. Associated symptoms include chest pain and shortness of breath. Pertinent negatives include no abdominal pain and no headaches. The symptoms are aggravated by exertion and walking. The symptoms are relieved by rest, ASA and medications (NO & ASA).    Past Medical History:  Diagnosis Date  . CHF (congestive heart failure) ([place])   . COPD (chronic obstructive pulmonary disease) ([place])   . GI bleed 05/2016  . Hyperlipidemia   . Hypertension   . Nonischemic cardiomyopathy ([place])    a. stress-induced cardiomyopathy by cath 08-17-08 - normal cors August 17, 2008 & 08-18-2010.  . Obesity   . Takotsubo cardiomyopathy   . Thyroid disease   . Tobacco abuse    Patient Active Problem List   Diagnosis Date Noted  . NSTEMI (non-ST elevated myocardial infarction) ([place]) 05/17/2016  . History of GI bleed 05/17/2016  . Chest pain 05/17/2016  . HCAP (healthcare-associated pneumonia) 05/17/2016  . BRBPR (bright red blood per rectum) 05/03/2016  . Pain of both breasts 02/16/2016  . Cardiomyopathy ([place]) 01/18/2013  . HTN (hypertension) 01/18/2013  . Hyperlipidemia 01/18/2013  . COPD (chronic obstructive pulmonary disease) ([place]) 01/18/2013   Past Surgical History:  Procedure Laterality Date  . CARDIAC CATHETERIZATION  08/17/08   no signficant CAD. EF 30%, apical ballooning, takotsubo cardiomyopathy  . TRANSTHORACIC ECHOCARDIOGRAM  05/2011   EF 50-55%   OB History    No data available      Home Medications    Prior to Admission medications   Medication Sig  Start Date End Date Taking? Authorizing Provider  albuterol (PROVENTIL HFA;VENTOLIN HFA) 108 (90 Base) MCG/ACT inhaler Inhale 2 puffs into the lungs every 4 (four) hours as needed for wheezing or shortness of breath. 02/16/16  Yes Boykin Nearing, MD  aspirin EC 81 MG tablet Take 1 tablet (81 mg total) by mouth daily. 02/16/16  Yes Josalyn Funches, MD  budesonide-formoterol (SYMBICORT) 160-4.5 MCG/ACT inhaler Inhale 2 puffs into the lungs 2 (two) times daily. Via patient assistance, patient has paperwork 02/16/16  Yes Boykin Nearing, MD  carvedilol (COREG) 6.25 MG tablet Take 6.25 mg by mouth 2 (two) times daily. 03/29/16  Yes Historical Provider, MD  furosemide (LASIX) 20 MG tablet Take 1 tablet (20 mg total) by mouth daily as needed for fluid or edema. 02/16/16  Yes Josalyn Funches, MD  lisinopril (PRINIVIL,ZESTRIL) 10 MG tablet Take 10 mg by mouth daily as needed (high blood pressure >130/100).   Yes Historical Provider, MD  ranitidine (ZANTAC) 150 MG tablet Take 150 mg by mouth daily as needed for heartburn.   Yes Historical Provider, MD  simvastatin (ZOCOR) 20 MG tablet Take 1 tablet (20 mg total) by mouth every evening. Patient taking differently: Take 20 mg by mouth at bedtime.  02/16/16  Yes Boykin Nearing, MD   Family History Family History  Problem Relation Age of Onset  . Lung cancer Mother     died in 08-18-03  . COPD Father   . Heart disease Brother     CABG at age  45  . Breast cancer Maternal Grandmother    Social History Social History  Substance Use Topics  . Smoking status: Former Smoker    Quit date: 04/03/2003  . Smokeless tobacco: Never Used  . Alcohol use No    Allergies   Patient has no known allergies.   Review of Systems Review of Systems  Constitutional: Positive for diaphoresis. Negative for chills and fever.  Respiratory: Positive for shortness of breath. Negative for cough.   Cardiovascular: Positive for chest pain.  Gastrointestinal: Negative for  abdominal pain and nausea.  Neurological: Negative for headaches.  All other systems reviewed and are negative.   Physical Exam Updated Vital Signs BP 102/77   Pulse 99   Temp 98.4 F (36.9 C) (Oral)   Resp 15   Ht 5\' 7"  (1.702 m)   Wt 86.2 kg   SpO2 95%   BMI 29.76 kg/m   Physical Exam  Constitutional: She is oriented to person, place, and time. No distress.  Overweight elderly Caucasian female  HENT:  Head: Normocephalic and atraumatic.  Eyes: EOM are normal. Pupils are equal, round, and reactive to light.  Neck: Normal range of motion. Neck supple.  Cardiovascular: Normal rate, regular rhythm and normal heart sounds.   Pulmonary/Chest:  Mild tachypnea, decreased breath sounds at bases bilaterally, maintaining saturations in mid 90s on room air  Abdominal: Soft. Bowel sounds are normal. She exhibits no distension. There is no tenderness.  Musculoskeletal: Normal range of motion.  Neurological: She is alert and oriented to person, place, and time.  Skin: Skin is warm and dry. Capillary refill takes less than 2 seconds. She is not diaphoretic.  Nursing note and vitals reviewed.   ED Treatments / Results  Labs (all labs ordered are listed, but only abnormal results are displayed) Labs Reviewed  CBC WITH DIFFERENTIAL/PLATELET - Abnormal; Notable for the following:       Result Value   WBC 11.3 (*)    Neutro Abs 9.5 (*)    All other components within normal limits  COMPREHENSIVE METABOLIC PANEL - Abnormal; Notable for the following:    CO2 20 (*)    Glucose, Bld 142 (*)    Creatinine, Ser 1.02 (*)    Total Protein 6.3 (*)    Albumin 3.4 (*)    ALT 12 (*)    GFR calc non Af Amer 59 (*)    All other components within normal limits  TROPONIN I - Abnormal; Notable for the following:    Troponin I 2.64 (*)    All other components within normal limits  I-STAT TROPOININ, ED - Abnormal; Notable for the following:    Troponin i, poc 1.85 (*)    All other components  within normal limits  I-STAT CHEM 8, ED - Abnormal; Notable for the following:    Glucose, Bld 146 (*)    Calcium, Ion 1.14 (*)    All other components within normal limits  PROTIME-INR  APTT  CBC  CBC  TROPONIN I  TROPONIN I  HEPARIN LEVEL (UNFRACTIONATED)  CBC   EKG  EKG Interpretation None      Radiology Dg Chest Portable 1 View  Result Date: 05/17/2016 CLINICAL DATA:  Chest pain and shortness of breath EXAM: PORTABLE CHEST 1 VIEW COMPARISON:  March 11, 2016 FINDINGS: Hazy opacity in the right base is felt to represent a degree of pneumonia. There is scarring in the left apex region, stable. Lungs elsewhere clear. Heart size and pulmonary vascularity are  normal. No adenopathy. There are multiple calcified nonenlarged lymph nodes consistent with prior granulomatous disease. Atherosclerotic calcification is noted in the aorta. No lesions. IMPRESSION: Opacity right base, consistent with pneumonia. Scarring left apex, stable. Evidence of prior granulomatous disease. No enlarged nodes. Stable cardiac silhouette. There is aortic atherosclerosis. Followup PA and lateral chest radiographs recommended in 3-4 weeks following trial of antibiotic therapy to ensure resolution and exclude underlying malignancy. Electronically Signed   By: Lowella Grip III M.D.   On: 05/17/2016 19:19   Procedures Procedures (including critical care time)  Medications Ordered in ED Medications  levalbuterol (XOPENEX) 0.63 MG/3ML nebulizer solution (not administered)  nitroGLYCERIN 50 mg in dextrose 5 % 250 mL (0.2 mg/mL) infusion (10 mcg/min Intravenous Rate/Dose Change 05/17/16 2020)  heparin ADULT infusion 100 units/mL (25000 units/275mL sodium chloride 0.45%) (1,000 Units/hr Intravenous New Bag/Given 05/17/16 2035)  pantoprazole (PROTONIX) EC tablet 40 mg (40 mg Oral Given 05/17/16 2034)  ceFEPIme (MAXIPIME) 1 g in dextrose 5 % 50 mL IVPB (0 g Intravenous Stopped 05/17/16 2248)  vancomycin (VANCOCIN) IVPB  1000 mg/200 mL premix (not administered)  levalbuterol (XOPENEX) nebulizer solution 0.63 mg (0.63 mg Nebulization Given 05/17/16 1902)  heparin bolus via infusion 4,000 Units (4,000 Units Intravenous Bolus from Bag 05/17/16 2040)    Initial Impression / Assessment and Plan / ED Course  I have reviewed the triage vital signs and the nursing notes.  Pertinent labs & imaging results that were available during my care of the patient were reviewed by me and considered in my medical decision making (see chart for details).  Clinical Course   61 y.o. @GENDER @ with above stated PMHx, HPI, and physical. PMHx of MI (2009, 20012, clean cath, Takusubo) & COPD (no O2 or CPAP). Symptom onset just prior to arrival. Precordial chest pressure radiating to shoulders and right arm, shortness of breath, and mild diaphoresis. Denies fever, chills, cough, or wheezing. Chest pain exertional and relieved with rest, and mitral glycerin, and aspirin. EMS called. Patient normotensive and routes. Already took ASA 324mg  and was given NO x2. Not hypoxic. EKG showing questionable ST elevation in lateral leads without reciprocal changes.  Code STEMI called upon patient's arrival. Cardiology evaluated the patient in the emergency department and in conjunction we reviewed the patient's records to discover previous clean cardiac catheterizations and suspicion for Takusubo. Patient with recent hospitalization for GI bleed is not been taking her aspirin for the past 2 weeks (in anticipation of colonoscopy in x2 days). Troponin elevated to 1.85. Given bleed, will start on high bleeding risk heparin regimen without bolus. CXR with concern for PNA. Given SOB, covered with Cefepime  Laboratory and imaging results were personally reviewed by myself and used in the medical decision making of this patient's treatment and disposition.  Pt admitted to medicine for further evaluation and management of ACS with that and a plan for telemetry, serial  troponins, and repeat cardiac echo to determine patient's current EF. Pt understands and agrees with the plan and has no further questions or concerns.   Pt care discussed with and followed by my attending, Dr. Merrily Pew  Mayer Camel, MD Pager [phone]  Final Clinical Impressions(s) / ED Diagnoses   Final diagnoses:  Precordial chest pain  SOB (shortness of breath)  Diaphoresis  Takotsubo cardiomyopathy  NSTEMI (non-ST elevated myocardial infarction) ([place])  Elevated troponin   New Prescriptions New Prescriptions   No medications on file     Mayer Camel, MD 05/17/16 [place]  [place], MD 05/18/16 [id number]    Merrily Pew, MD 05/18/16 0010

## 2016-05-17 NOTE — Progress Notes (Signed)
Pharmacy Antibiotic Note  [name] is a 61 y.o. female admitted on 05/17/2016 with pneumonia.  Pharmacy has been consulted for vancomycin and cefepime dosing.  Plan: Vancomycin 1g IV every 12 hours.  Goal trough 15-20 mcg/mL.  Cefepime 1g IV q8h Monitor culture data, renal function and clinical course VT at SS prn  Height: 5\' 7"  (170.2 cm) Weight: 190 lb (86.2 kg) IBW/kg (Calculated) : 61.6  Temp (24hrs), Avg:98.4 F (36.9 C), Min:98.4 F (36.9 C), Max:98.4 F (36.9 C)   Recent Labs Lab 05/17/16 1858 05/17/16 1902  WBC  --  11.3*  CREATININE 1.00 1.02*    Estimated Creatinine Clearance: 66.9 mL/min (by C-G formula based on SCr of 1.02 mg/dL (H)).    No Known Allergies   Andrey Cota. Diona Foley, PharmD, [facility] Clinical Pharmacist Pager [phone] 05/17/2016 9:13 PM

## 2016-05-17 NOTE — ED Notes (Signed)
Critical Troponin called from lab.  Dr. Roel Cluck made aware.

## 2016-05-17 NOTE — ED Notes (Signed)
Pt st's pain in chest is still a number 7 on pain scale, NTG gtt increased to 50mcg/min

## 2016-05-18 ENCOUNTER — Inpatient Hospital Stay (HOSPITAL_COMMUNITY): Payer: Self-pay

## 2016-05-18 ENCOUNTER — Encounter (HOSPITAL_COMMUNITY)
Admission: EM | Disposition: A | Discharge status: Home / Self Care | Payer: Self-pay | Source: Home / Self Care | Attending: Internal Medicine

## 2016-05-18 ENCOUNTER — Observation Stay (HOSPITAL_COMMUNITY): Payer: Self-pay

## 2016-05-18 DIAGNOSIS — R079 Chest pain, unspecified: Secondary | ICD-10-CM

## 2016-05-18 HISTORY — PX: CARDIAC CATHETERIZATION: SHX172

## 2016-05-18 LAB — BASIC METABOLIC PANEL
Anion gap: 7 (ref 5–15)
BUN: 10 mg/dL (ref 6–20)
CO2: 23 mmol/L (ref 22–32)
Calcium: 9.1 mg/dL (ref 8.9–10.3)
Chloride: 108 mmol/L (ref 101–111)
Creatinine, Ser: 1 mg/dL (ref 0.44–1.00)
GFR calc Af Amer: >60 mL/min (ref >60)
GLUCOSE: 104 mg/dL — AB (ref 65–99)
POTASSIUM: 3.9 mmol/L (ref 3.5–5.1)
Sodium: 138 mmol/L (ref 135–145)

## 2016-05-18 LAB — CBC
HCT: 38 % (ref 36.0–46.0)
HCT: 33.5 % — ABNORMAL LOW (ref 36.0–46.0)
HEMATOCRIT: 36.7 % (ref 36.0–46.0)
HEMATOCRIT: 37.8 % (ref 36.0–46.0)
HEMOGLOBIN: 11.1 g/dL — AB (ref 12.0–15.0)
HEMOGLOBIN: 12.1 g/dL (ref 12.0–15.0)
HEMOGLOBIN: 12.6 g/dL (ref 12.0–15.0)
Hemoglobin: 12.5 g/dL (ref 12.0–15.0)
MCH: 29.7 pg (ref 26.0–34.0)
MCH: 29.9 pg (ref 26.0–34.0)
MCH: 29.9 pg (ref 26.0–34.0)
MCH: 30.2 pg (ref 26.0–34.0)
MCHC: 32.9 g/dL (ref 30.0–36.0)
MCHC: 33 g/dL (ref 30.0–36.0)
MCHC: 33.1 g/dL (ref 30.0–36.0)
MCHC: 33.3 g/dL (ref 30.0–36.0)
MCV: 89.8 fL (ref 78.0–100.0)
MCV: 90.2 fL (ref 78.0–100.0)
MCV: 90.9 fL (ref 78.0–100.0)
MCV: 91.3 fL (ref 78.0–100.0)
PLATELETS: 228 10*3/uL (ref 150–400)
PLATELETS: 193 10*3/uL (ref 150–400)
Platelets: 224 10*3/uL (ref 150–400)
Platelets: 257 10*3/uL (ref 150–400)
RBC: 4.18 MIL/uL (ref 3.87–5.11)
RBC: 4.07 MIL/uL (ref 3.87–5.11)
RBC: 4.21 MIL/uL (ref 3.87–5.11)
RBC: 3.67 MIL/uL — ABNORMAL LOW (ref 3.87–5.11)
RDW: 13.2 % (ref 11.5–15.5)
RDW: 13.3 % (ref 11.5–15.5)
RDW: 13.5 % (ref 11.5–15.5)
RDW: 13.5 % (ref 11.5–15.5)
WBC: 6.8 10*3/uL (ref 4.0–10.5)
WBC: 7.9 10*3/uL (ref 4.0–10.5)
WBC: 8.4 10*3/uL (ref 4.0–10.5)
WBC: 9.9 10*3/uL (ref 4.0–10.5)

## 2016-05-18 LAB — PROTIME-INR
INR: 1.07
Prothrombin Time: 13.9 seconds (ref 11.4–15.2)

## 2016-05-18 LAB — TROPONIN I
Troponin I: 4.08 ng/mL (ref <0.03)
Troponin I: 1.31 ng/mL (ref <0.03)

## 2016-05-18 LAB — HEPARIN LEVEL (UNFRACTIONATED)
HEPARIN UNFRACTIONATED: 0.58 [IU]/mL (ref 0.30–0.70)
Heparin Unfractionated: >2.2 IU/mL — ABNORMAL HIGH (ref 0.30–0.70)

## 2016-05-18 LAB — MRSA PCR SCREENING: MRSA BY PCR: NEGATIVE

## 2016-05-18 LAB — STREP PNEUMONIAE URINARY ANTIGEN: STREP PNEUMO URINARY ANTIGEN: NEGATIVE

## 2016-05-18 LAB — PROCALCITONIN: Procalcitonin: <0.1 ng/mL

## 2016-05-18 SURGERY — LEFT HEART CATH AND CORONARY ANGIOGRAPHY

## 2016-05-18 MED ORDER — SODIUM CHLORIDE 0.9% FLUSH: 3.0000 mL | INTRAVENOUS | Status: DC | PRN

## 2016-05-18 MED ORDER — SODIUM CHLORIDE 0.9% FLUSH
3.0000 mL | Freq: Two times a day (BID) | INTRAVENOUS | Status: DC
  Administered 2016-05-19: 3 mL via INTRAVENOUS

## 2016-05-18 MED ORDER — VERAPAMIL HCL 2.5 MG/ML IV SOLN
INTRAVENOUS | Status: DC | PRN
  Administered 2016-05-18: 10 mL via INTRA_ARTERIAL

## 2016-05-18 MED ORDER — ASPIRIN 81 MG PO CHEW: 81.0000 mg | CHEWABLE_TABLET | ORAL | Status: AC

## 2016-05-18 MED ORDER — ONDANSETRON HCL 4 MG/2ML IJ SOLN: 4.0000 mg | Freq: Four times a day (QID) | INTRAMUSCULAR | Status: DC | PRN

## 2016-05-18 MED ORDER — FENTANYL CITRATE (PF) 100 MCG/2ML IJ SOLN
INTRAMUSCULAR | Status: DC | PRN
  Administered 2016-05-18: 25 ug via INTRAVENOUS

## 2016-05-18 MED ORDER — ACETAMINOPHEN 325 MG PO TABS: 650.0000 mg | ORAL_TABLET | ORAL | Status: DC | PRN

## 2016-05-18 MED ORDER — SODIUM CHLORIDE 0.9 % IV SOLN
INTRAVENOUS | Status: DC
  Administered 2016-05-18: 500 mL via INTRAVENOUS
  Administered 2016-05-18: 13:00:00 via INTRAVENOUS

## 2016-05-18 MED ORDER — ALPRAZOLAM 0.5 MG PO TABS
0.5000 mg | ORAL_TABLET | Freq: Three times a day (TID) | ORAL | Status: DC | PRN
  Administered 2016-05-18 – 2016-05-19 (×2): 0.5 mg via ORAL
  Filled 2016-05-18 (×2): qty 1

## 2016-05-18 MED ORDER — LIDOCAINE HCL (PF) 1 % IJ SOLN
INTRAMUSCULAR | Status: AC
  Filled 2016-05-18: qty 30

## 2016-05-18 MED ORDER — SODIUM CHLORIDE 0.9 % IV SOLN: INTRAVENOUS | Status: AC

## 2016-05-18 MED ORDER — HEPARIN SODIUM (PORCINE) 1000 UNIT/ML IJ SOLN
INTRAMUSCULAR | Status: AC
  Filled 2016-05-18: qty 1

## 2016-05-18 MED ORDER — ASPIRIN EC 81 MG PO TBEC
81.0000 mg | DELAYED_RELEASE_TABLET | Freq: Every day | ORAL | Status: DC
Start: 2016-05-18 — End: 2016-05-20
  Administered 2016-05-18 – 2016-05-20 (×3): 81 mg via ORAL
  Filled 2016-05-18 (×3): qty 1

## 2016-05-18 MED ORDER — SIMVASTATIN 20 MG PO TABS
20.0000 mg | ORAL_TABLET | Freq: Every day | ORAL | Status: DC
  Administered 2016-05-18 – 2016-05-19 (×2): 20 mg via ORAL
  Filled 2016-05-18 (×3): qty 1

## 2016-05-18 MED ORDER — HEPARIN (PORCINE) IN NACL 2-0.9 UNIT/ML-% IJ SOLN
INTRAMUSCULAR | Status: AC
  Filled 2016-05-18: qty 1000

## 2016-05-18 MED ORDER — MORPHINE SULFATE (PF) 2 MG/ML IV SOLN: 2.0000 mg | INTRAVENOUS | Status: DC | PRN

## 2016-05-18 MED ORDER — HEPARIN SODIUM (PORCINE) 1000 UNIT/ML IJ SOLN
INTRAMUSCULAR | Status: DC | PRN
  Administered 2016-05-18: 5000 [IU] via INTRAVENOUS

## 2016-05-18 MED ORDER — FENTANYL CITRATE (PF) 100 MCG/2ML IJ SOLN
INTRAMUSCULAR | Status: AC
  Filled 2016-05-18: qty 2

## 2016-05-18 MED ORDER — MOMETASONE FURO-FORMOTEROL FUM 200-5 MCG/ACT IN AERO
2.0000 | INHALATION_SPRAY | Freq: Two times a day (BID) | RESPIRATORY_TRACT | Status: DC
  Administered 2016-05-18 – 2016-05-20 (×5): 2 via RESPIRATORY_TRACT
  Filled 2016-05-18: qty 8.8

## 2016-05-18 MED ORDER — HEPARIN (PORCINE) IN NACL 100-0.45 UNIT/ML-% IJ SOLN: 750.0000 [IU]/h | INTRAMUSCULAR | Status: DC

## 2016-05-18 MED ORDER — PNEUMOCOCCAL VAC POLYVALENT 25 MCG/0.5ML IJ INJ
0.5000 mL | INJECTION | INTRAMUSCULAR | Status: AC
Start: 2016-05-19 — End: 2016-05-19
  Administered 2016-05-19: 0.5 mL via INTRAMUSCULAR
  Filled 2016-05-18: qty 0.5

## 2016-05-18 MED ORDER — LIDOCAINE HCL (PF) 1 % IJ SOLN
INTRAMUSCULAR | Status: DC | PRN
  Administered 2016-05-18: 2 mL

## 2016-05-18 MED ORDER — SODIUM CHLORIDE 0.9 % IV SOLN: 250.0000 mL | INTRAVENOUS | Status: DC | PRN

## 2016-05-18 MED ORDER — IOPAMIDOL (ISOVUE-370) INJECTION 76%
INTRAVENOUS | Status: AC
  Filled 2016-05-18: qty 100

## 2016-05-18 MED ORDER — HEPARIN (PORCINE) IN NACL 2-0.9 UNIT/ML-% IJ SOLN
INTRAMUSCULAR | Status: DC | PRN
  Administered 2016-05-18: 1000 mL

## 2016-05-18 MED ORDER — SODIUM CHLORIDE 0.9% FLUSH
3.0000 mL | Freq: Two times a day (BID) | INTRAVENOUS | Status: DC
  Administered 2016-05-18: 3 mL via INTRAVENOUS

## 2016-05-18 MED ORDER — IOPAMIDOL (ISOVUE-370) INJECTION 76%
INTRAVENOUS | Status: DC | PRN
  Administered 2016-05-18: 50 mL via INTRA_ARTERIAL

## 2016-05-18 MED ORDER — VERAPAMIL HCL 2.5 MG/ML IV SOLN
INTRAVENOUS | Status: AC
  Filled 2016-05-18: qty 2

## 2016-05-18 MED ORDER — MIDAZOLAM HCL 2 MG/2ML IJ SOLN
INTRAMUSCULAR | Status: DC | PRN
  Administered 2016-05-18: 2 mg via INTRAVENOUS

## 2016-05-18 MED ORDER — HEPARIN SODIUM (PORCINE) 5000 UNIT/ML IJ SOLN: 5000.0000 [IU] | Freq: Three times a day (TID) | INTRAMUSCULAR | Status: DC

## 2016-05-18 MED ORDER — ALPRAZOLAM 0.25 MG PO TABS
0.2500 mg | ORAL_TABLET | Freq: Two times a day (BID) | ORAL | Status: DC | PRN
  Administered 2016-05-18: 0.25 mg via ORAL
  Filled 2016-05-18: qty 1

## 2016-05-18 MED ORDER — MIDAZOLAM HCL 2 MG/2ML IJ SOLN
INTRAMUSCULAR | Status: AC
  Filled 2016-05-18: qty 2

## 2016-05-18 SURGICAL SUPPLY — 12 items
CATH EXPO 5F FL3.5 (CATHETERS) ×3 IMPLANT
CATH EXPO 5FR FR4 (CATHETERS) ×3 IMPLANT
CATH INFINITI 5FR ANG PIGTAIL (CATHETERS) ×3 IMPLANT
DEVICE RAD COMP TR BAND LRG (VASCULAR PRODUCTS) ×3 IMPLANT
GLIDESHEATH SLEND SS 6F .021 (SHEATH) ×3 IMPLANT
GUIDEWIRE INQWIRE 1.5J.035X260 (WIRE) ×1 IMPLANT
INQWIRE 1.5J .035X260CM (WIRE) ×3
KIT HEART LEFT (KITS) ×3 IMPLANT
PACK CARDIAC CATHETERIZATION (CUSTOM PROCEDURE TRAY) ×3 IMPLANT
SYR MEDRAD MARK V 150ML (SYRINGE) ×3 IMPLANT
TRANSDUCER W/STOPCOCK (MISCELLANEOUS) ×3 IMPLANT
TUBING CIL FLEX 10 FLL-RA (TUBING) ×3 IMPLANT

## 2016-05-18 NOTE — Progress Notes (Signed)
ANTICOAGULATION CONSULT NOTE - Follow Up Consult  Pharmacy Consult for heparin Indication: chest pain/ACS  No Known Allergies  Patient Measurements: Height: 5\' 7"  (170.2 cm) Weight: 194 lb 14.4 oz (88.4 kg) IBW/kg (Calculated) : 61.6  Vital Signs: Temp: 98.3 F (36.8 C) (01/16 0015) Temp Source: Oral (01/16 0015) BP: 101/74 (01/15 2315) Pulse Rate: 91 (01/15 2315)  Labs:  Recent Labs  05/17/16 1858 05/17/16 1902 05/17/16 2015 05/18/16 0025 05/18/16 0157  HGB 13.3 13.2  --  12.6 12.1  HCT 39.0 39.4  --  37.8 36.7  PLT  --  277  --  257 224  APTT  --  30  --   --   --   LABPROT  --  12.9  --   --   --   INR  --  0.97  --   --   --   HEPARINUNFRC  --   --   --   --  0.58  CREATININE 1.00 1.02*  --   --   --   TROPONINI  --   --  2.64*  --   --     Assessment: 61yo female above goal on heparin with initial dosing for elevated troponin in setting of recent GIB; Hgb trending down.  Goal of Therapy:  Heparin level 0.3-0.5 units/ml   Plan:  Will decrease heparin gtt slightly to 900 units/hr and check level w/ next troponin.  Wynona Neat, PharmD, BCPS  05/18/2016,2:38 AM

## 2016-05-18 NOTE — Interval H&P Note (Signed)
History and Physical Interval Note:  05/18/2016 5:27 PM  [name]  has presented today for surgery, with the diagnosis of n stemi  The various methods of treatment have been discussed with the patient and family. After consideration of risks, benefits and other options for treatment, the patient has consented to  Procedure(s): Left Heart Cath and Coronary Angiography (N/A) as a surgical intervention .  The patient's history has been reviewed, patient examined, no change in status, stable for surgery.  I have reviewed the patient's chart and labs.  Questions were answered to the patient's satisfaction.     Sherren Mocha

## 2016-05-18 NOTE — Progress Notes (Signed)
[facility] Progress Note  Patient: NATAZIA CANTE [id number]   PCP: Minerva Ends, MD DOB: 1955/10/30   DOA: 05/17/2016   DOS: 05/18/2016   Date of Service: the patient was seen and examined on 05/18/2016  Brief hospital course: Pt. with PMH of COPD, HTN, GI bleed, HLD, NICM with takatsubo with normal coronaries; admitted on 05/17/2016, with complaint of chest tightness and shortnes of breath , was found to have NSTEMI vs takatsubo. Currently further plan is cardiac cath.  Assessment and Plan: 1. Non-STEMI Possible stress-induced cardiomyopathy Troponin 3-4.08, currently trending downwards. On heparin as well as nitroglycerin drip. Continues to have chest tightness. Patient does have history of normal coronaries in the past. Plan for cardiac catheterization today. We'll monitor.  2. Suspected healthcare associate of pneumonia. Patient clinically does not appear to have any pneumonia. No leukocytosis. No fever. Chest x-ray shows a possible consolidation in the right lower lobe. I would send the patient for another chest x-ray which is proper 2 view instead of a portable chest x-ray. Patient was started on IV vancomycin and cefepime in the ER. We will continue to monitor cultures, check pro-calcitonin, strep antigen, urine antigens for Legionella We will monitor the patient.  3. Dyslipidemia. Continue simvastatin.  4. History of COPD. Doesn't appear to have any acute flare up at present. Continue dulera.  Bowel regimen: last BM prior to arrival Diet: cardiac diet DVT Prophylaxis: on therapeutic anticoagulation.  Advance goals of care discussion: full code  Family Communication: family was present at bedside, at the time of interview. The pt provided permission to discuss medical plan with the family. Opportunity was given to ask question and all questions were answered satisfactorily.   Disposition:  Discharge to home. Expected discharge date: 05/20/2016,  culture clearance  Consultants: cardiology Procedures: cardiac cath  Antibiotics: Anti-infectives    Start     Dose/Rate Route Frequency Ordered Stop   05/17/16 2200  ceFEPIme (MAXIPIME) 1 g in dextrose 5 % 50 mL IVPB  Status:  Discontinued     1 g 100 mL/hr over 30 Minutes Intravenous Every 8 hours 05/17/16 2112 05/17/16 2114   05/17/16 2200  vancomycin (VANCOCIN) IVPB 1000 mg/200 mL premix     1,000 mg 200 mL/hr over 60 Minutes Intravenous Every 12 hours 05/17/16 2114     05/17/16 2130  ceFEPIme (MAXIPIME) 1 g in dextrose 5 % 50 mL IVPB     1 g 100 mL/hr over 30 Minutes Intravenous Every 8 hours 05/17/16 2114          Subjective: feeling better, no chest pain but still has chest tightness  Objective: Physical Exam: Vitals:   05/18/16 0419 05/18/16 0800 05/18/16 0921 05/18/16 1204  BP: 93/75 105/78  94/67  Pulse: 85 86  81  Resp: (!) 22 15  17   Temp: 98 F (36.7 C) 99.3 F (37.4 C)  98.9 F (37.2 C)  TempSrc: Oral Oral  Oral  SpO2: 100% 98% 98% 99%  Weight: 88.4 kg (194 lb 14.4 oz)     Height:        Intake/Output Summary (Last 24 hours) at 05/18/16 1424 Last data filed at 05/18/16 0900  Gross per 24 hour  Intake                0 ml  Output              500 ml  Net             -  500 ml   Filed Weights   05/17/16 1853 05/18/16 0015 05/18/16 0419  Weight: 86.2 kg (190 lb) 88.4 kg (194 lb 14.4 oz) 88.4 kg (194 lb 14.4 oz)    General: Alert, Awake and Oriented to Time, Place and Person. Appear in moderate distress, affect appropriate Eyes: PERRL, Conjunctiva normal ENT: Oral Mucosa clear moist. Neck: difficult to assess JVD, no Abnormal Mass Or lumps Cardiovascular: S1 and S2 Present, aortic systolic Murmur, Respiratory: Bilateral Air entry equal and Decreased, no use of accessory muscle, basal Crackles, no wheezes Abdomen: Bowel Sound present, Soft and no tenderness Skin: no redness, no Rash, no induration Extremities: trace Pedal edema, no calf  tenderness Neurologic: Grossly no focal neuro deficit. Bilaterally Equal motor strength   Data Reviewed: CBC:  Recent Labs Lab 05/17/16 1858 05/17/16 1902 05/18/16 0025 05/18/16 0157 05/18/16 0946  WBC  --  11.3* 9.9 8.4 7.9  NEUTROABS  --  9.5*  --   --   --   HGB 13.3 13.2 12.6 12.1 12.5  HCT 39.0 39.4 37.8 36.7 38.0  MCV  --  90.4 89.8 90.2 90.9  PLT  --  277 257 224 [id number]   Basic Metabolic Panel:  Recent Labs Lab 05/17/16 1858 05/17/16 1902 05/18/16 1101  NA 138 136 138  K 4.5 4.5 3.9  CL 106 105 108  CO2  --  20* 23  GLUCOSE 146* 142* 104*  BUN 16 14 10   CREATININE 1.00 1.02* 1.00  CALCIUM  --  9.0 9.1    Liver Function Tests:  Recent Labs Lab 05/17/16 1902  AST 24  ALT 12*  ALKPHOS 76  BILITOT 0.5  PROT 6.3*  ALBUMIN 3.4*   No results for input(s): LIPASE, AMYLASE in the last 168 hours. No results for input(s): AMMONIA in the last 168 hours. Coagulation Profile:  Recent Labs Lab 05/17/16 1902 05/18/16 1101  INR 0.97 1.07   Cardiac Enzymes:  Recent Labs Lab 05/17/16 2015 05/18/16 0157 05/18/16 0626  TROPONINI 2.64* 4.08* 1.31*   BNP (last 3 results) No results for input(s): PROBNP in the last 8760 hours.  CBG: No results for input(s): GLUCAP in the last 168 hours.  Studies: Dg Chest Portable 1 View  Result Date: 05/17/2016 CLINICAL DATA:  Chest pain and shortness of breath EXAM: PORTABLE CHEST 1 VIEW COMPARISON:  March 11, 2016 FINDINGS: Hazy opacity in the right base is felt to represent a degree of pneumonia. There is scarring in the left apex region, stable. Lungs elsewhere clear. Heart size and pulmonary vascularity are normal. No adenopathy. There are multiple calcified nonenlarged lymph nodes consistent with prior granulomatous disease. Atherosclerotic calcification is noted in the aorta. No lesions. IMPRESSION: Opacity right base, consistent with pneumonia. Scarring left apex, stable. Evidence of prior granulomatous disease. No  enlarged nodes. Stable cardiac silhouette. There is aortic atherosclerosis. Followup PA and lateral chest radiographs recommended in 3-4 weeks following trial of antibiotic therapy to ensure resolution and exclude underlying malignancy. Electronically Signed   By: Lowella Grip III M.D.   On: 05/17/2016 19:19     Scheduled Meds: . aspirin EC  81 mg Oral Daily  . ceFEPime (MAXIPIME) IV  1 g Intravenous Q8H  . mometasone-formoterol  2 puff Inhalation BID  . pantoprazole  40 mg Oral BID  . [START ON 05/19/2016] pneumococcal 23 valent vaccine  0.5 mL Intramuscular Tomorrow-1000  . simvastatin  20 mg Oral QHS  . sodium chloride flush  3 mL Intravenous Q12H  .  vancomycin  1,000 mg Intravenous Q12H   Continuous Infusions: . sodium chloride 100 mL/hr at 05/18/16 1230  . heparin 750 Units/hr (05/18/16 1000)  . nitroGLYCERIN 10 mcg/min (05/17/16 2020)   PRN Meds: sodium chloride, acetaminophen, ALPRAZolam, morphine injection, ondansetron (ZOFRAN) IV, sodium chloride flush  Time spent: 30 minutes  Author: Berle Mull, MD [facility] Pager: [phone] 05/18/2016 2:24 PM  If 7PM-7AM, please contact night-coverage at [email or web address], password [facility]

## 2016-05-18 NOTE — Progress Notes (Signed)
  Echocardiogram 2D Echocardiogram has been performed.  Johny Chess 05/18/2016, 5:04 PM

## 2016-05-18 NOTE — H&P (View-Only) (Signed)
Patient Name: [name] Date of Encounter: 05/18/2016  Primary Cardiologist: Dr. [facility] Problem List     Active Problems:   HTN (hypertension)   Hyperlipidemia   COPD (chronic obstructive pulmonary disease) (HCC)   NSTEMI (non-ST elevated myocardial infarction) ([place])   History of GI bleed   Chest pain   HCAP (healthcare-associated pneumonia)     Subjective   Tearful, anxious. Having chest pain and SOB.   Inpatient Medications    Scheduled Meds: . aspirin EC  81 mg Oral Daily  . ceFEPime (MAXIPIME) IV  1 g Intravenous Q8H  . mometasone-formoterol  2 puff Inhalation BID  . pantoprazole  40 mg Oral BID  . [START ON 05/19/2016] pneumococcal 23 valent vaccine  0.5 mL Intramuscular Tomorrow-1000  . simvastatin  20 mg Oral QHS  . vancomycin  1,000 mg Intravenous Q12H   Continuous Infusions: . heparin 1,000 Units/hr (05/17/16 2035)  . nitroGLYCERIN 10 mcg/min (05/17/16 2020)   PRN Meds: acetaminophen, ALPRAZolam, morphine injection, ondansetron (ZOFRAN) IV   Vital Signs    Vitals:   05/17/16 2300 05/17/16 2315 05/18/16 0015 05/18/16 0419  BP: 102/82 101/74  93/75  Pulse: 90 91  85  Resp: 17 17  (!) 22  Temp:   98.3 F (36.8 C) 98 F (36.7 C)  TempSrc:   Oral Oral  SpO2: 99% 97%  100%  Weight:   194 lb 14.4 oz (88.4 kg) 194 lb 14.4 oz (88.4 kg)  Height:       No intake or output data in the 24 hours ending 05/18/16 0822 Filed Weights   05/17/16 1853 05/18/16 0015 05/18/16 0419  Weight: 190 lb (86.2 kg) 194 lb 14.4 oz (88.4 kg) 194 lb 14.4 oz (88.4 kg)    Physical Exam   GEN: Well nourished, well developed, female in emotional distress.  HEENT: Grossly normal.  Neck: Supple, no JVD, carotid bruits, or masses. Cardiac: RRR, no murmurs, rubs, or gallops. No clubbing, cyanosis, edema.  Radials/DP/PT 2+ and equal bilaterally.  Respiratory:  Respirations regular and unlabored, clear to auscultation bilaterally. GI: Soft, nontender, nondistended,  BS + x 4. MS: no deformity or atrophy. Skin: warm and dry, no rash. Neuro:  Strength and sensation are intact. Psych: AAOx3.  Normal affect.  Labs    CBC  Recent Labs  05/17/16 1902 05/18/16 0025 05/18/16 0157  WBC 11.3* 9.9 8.4  NEUTROABS 9.5*  --   --   HGB 13.2 12.6 12.1  HCT 39.4 37.8 36.7  MCV 90.4 89.8 90.2  PLT 277 257 [id number]   Basic Metabolic Panel  Recent Labs  05/17/16 1858 05/17/16 1902  NA 138 136  K 4.5 4.5  CL 106 105  CO2  --  20*  GLUCOSE 146* 142*  BUN 16 14  CREATININE 1.00 1.02*  CALCIUM  --  9.0   Liver Function Tests  Recent Labs  05/17/16 1902  AST 24  ALT 12*  ALKPHOS 76  BILITOT 0.5  PROT 6.3*  ALBUMIN 3.4*   Cardiac Enzymes  Recent Labs  05/17/16 2015 05/18/16 0157 05/18/16 0626  TROPONINI 2.64* 4.08* 1.31*     Telemetry    NSR, Sinus tach.  - Personally Reviewed  ECG     NSR, low voltage QRS. ST elevation has resolved.- Personally Reviewed  Radiology    Dg Chest Portable 1 View  Result Date: 05/17/2016 CLINICAL DATA:  Chest pain and shortness of breath EXAM: PORTABLE CHEST 1 VIEW COMPARISON:  March 11, 2016 FINDINGS: Hazy opacity in the right base is felt to represent a degree of pneumonia. There is scarring in the left apex region, stable. Lungs elsewhere clear. Heart size and pulmonary vascularity are normal. No adenopathy. There are multiple calcified nonenlarged lymph nodes consistent with prior granulomatous disease. Atherosclerotic calcification is noted in the aorta. No lesions. IMPRESSION: Opacity right base, consistent with pneumonia. Scarring left apex, stable. Evidence of prior granulomatous disease. No enlarged nodes. Stable cardiac silhouette. There is aortic atherosclerosis. Followup PA and lateral chest radiographs recommended in 3-4 weeks following trial of antibiotic therapy to ensure resolution and exclude underlying malignancy. Electronically Signed   By: Lowella Grip III M.D.   On: 05/17/2016  19:19    Cardiac Studies   Echo pending   Patient Profile     [name] is a 61 year old female with a past medical history of stress induced cardiomyopathy in (EF 30% in 2010 with improvement to 50-55%), normal cors 2010 & 2012, COPD, prior tobacco abuse, obesity, recent GIB who presented to [facility] with worsening SOB and chest/shoulder tightness.  Assessment & Plan    1. NSTEMI - Clinical scenario may be consistent with recurrent stress-induced cardiomyopathy as symptoms correlate with onset of stressful news.  Troponin 2.64>>4.08>>1.31. She is on heparin gtt and Nitro gtt. Still having chest pain this am, but is tearful and anxious. Normal cors by cath in 2012 and 2010.   Will cath today to rule out ischemic disease vs recurrent stress induced cardiomyopathy.   2. COPD - Stable. On home inhalers.   3. Recent GIB - hemoglobin stable on heparin gtt. Continue protonix.   4. CAP: On IV antibiotics per primary team.   The risks and benefits of a cardiac catheterization including, but not limited to, death, stroke, MI, kidney damage and bleeding were discussed with the patient who indicates understanding and agrees to proceed.    Signed, Arbutus Leas, NP  05/18/2016, 8:22 AM    Agree with note by Jettie Booze NP  Feeling better. Trop peaked at 4. EKG improved as well. Exam benign. Plan cor angio today to R/O Takasubo. Vs CAD  Lorretta Harp, M.D., [place], [facility], Laverta [place] [address]. [place], [place]  [address]  [phone] 05/18/2016 11:12 AM

## 2016-05-18 NOTE — Progress Notes (Signed)
ANTICOAGULATION CONSULT NOTE  Pharmacy Consult for Heparin Indication: chest pain/ACS  No Known Allergies  Patient Measurements: Height: 5\' 7"  (170.2 cm) Weight: 194 lb 14.4 oz (88.4 kg) IBW/kg (Calculated) : 61.6 Heparin Dosing Weight: 80 kg  Vital Signs: Temp: 99.3 F (37.4 C) (01/16 0800) Temp Source: Oral (01/16 0800) BP: 105/78 (01/16 0800) Pulse Rate: 86 (01/16 0800)  Labs:  Recent Labs  05/17/16 1858 05/17/16 1902 05/17/16 2015 05/18/16 0025 05/18/16 0157 05/18/16 0626  HGB 13.3 13.2  --  12.6 12.1  --   HCT 39.0 39.4  --  37.8 36.7  --   PLT  --  277  --  257 224  --   APTT  --  30  --   --   --   --   LABPROT  --  12.9  --   --   --   --   INR  --  0.97  --   --   --   --   HEPARINUNFRC  --   --   --   --  0.58 >2.20*  CREATININE 1.00 1.02*  --   --   --   --   TROPONINI  --   --  2.64*  --  4.08* 1.31*    Estimated Creatinine Clearance: 67.8 mL/min (by C-G formula based on SCr of 1.02 mg/dL (H)).   Medical History: Past Medical History:  Diagnosis Date  . CHF (congestive heart failure) ([place])   . COPD (chronic obstructive pulmonary disease) ([place])   . GI bleed 05/2016  . Hyperlipidemia   . Hypertension   . Nonischemic cardiomyopathy ([place])    a. stress-induced cardiomyopathy by cath 2010 - normal cors 2010 & 2012.  . Obesity   . Takotsubo cardiomyopathy   . Thyroid disease   . Tobacco abuse     Medications:  Prescriptions Prior to Admission  Medication Sig Dispense Refill Last Dose  . albuterol (PROVENTIL HFA;VENTOLIN HFA) 108 (90 Base) MCG/ACT inhaler Inhale 2 puffs into the lungs every 4 (four) hours as needed for wheezing or shortness of breath. 1 Inhaler 5 05/17/2016 at 1700  . aspirin EC 81 MG tablet Take 1 tablet (81 mg total) by mouth daily. 30 tablet 11 05/17/2016 at 1700  . budesonide-formoterol (SYMBICORT) 160-4.5 MCG/ACT inhaler Inhale 2 puffs into the lungs 2 (two) times daily. Via patient assistance, patient has paperwork 3 Inhaler 3  05/17/2016 at afternoon  . carvedilol (COREG) 6.25 MG tablet Take 6.25 mg by mouth 2 (two) times daily.  11 05/17/2016 at 1700  . furosemide (LASIX) 20 MG tablet Take 1 tablet (20 mg total) by mouth daily as needed for fluid or edema. 30 tablet 5 week ago  . lisinopril (PRINIVIL,ZESTRIL) 10 MG tablet Take 10 mg by mouth daily as needed (high blood pressure >130/100).   05/17/2016 at 1400  . ranitidine (ZANTAC) 150 MG tablet Take 150 mg by mouth daily as needed for heartburn.   week ago  . simvastatin (ZOCOR) 20 MG tablet Take 1 tablet (20 mg total) by mouth every evening. (Patient taking differently: Take 20 mg by mouth at bedtime. ) 30 tablet 11 05/16/2016 at Unknown time    Assessment: 61yo female with history of MI and recent GIB presents with CP. Pharmacy is consulted to dose heparin for ACS/chest pain. Will dose conservatively with recent GIB. Heparin level now >2.2 despite rate decrease - possibly drawn incorrectly? But level appears to have been drawn from opposite arm from  information reported. No bleed or IV line issues noted.  Goal of Therapy:  Heparin level 0.3-0.5 units/ml Monitor platelets by anticoagulation protocol: Yes   Plan:  Hold heparin x 1 hour; resume at 1000 at 750 units/h (communicated plan with RN) 6h heparin level Daily heparin level/CBC Monitor for s/sx bleeding   Elicia Lamp, PharmD, BCPS Clinical Pharmacist 05/18/2016 8:49 AM

## 2016-05-18 NOTE — Progress Notes (Signed)
Patient Name: [name] Date of Encounter: 05/18/2016  Primary Cardiologist: Dr. [facility] Problem List     Active Problems:   HTN (hypertension)   Hyperlipidemia   COPD (chronic obstructive pulmonary disease) (HCC)   NSTEMI (non-ST elevated myocardial infarction) ([place])   History of GI bleed   Chest pain   HCAP (healthcare-associated pneumonia)     Subjective   Tearful, anxious. Having chest pain and SOB.   Inpatient Medications    Scheduled Meds: . aspirin EC  81 mg Oral Daily  . ceFEPime (MAXIPIME) IV  1 g Intravenous Q8H  . mometasone-formoterol  2 puff Inhalation BID  . pantoprazole  40 mg Oral BID  . [START ON 05/19/2016] pneumococcal 23 valent vaccine  0.5 mL Intramuscular Tomorrow-1000  . simvastatin  20 mg Oral QHS  . vancomycin  1,000 mg Intravenous Q12H   Continuous Infusions: . heparin 1,000 Units/hr (05/17/16 2035)  . nitroGLYCERIN 10 mcg/min (05/17/16 2020)   PRN Meds: acetaminophen, ALPRAZolam, morphine injection, ondansetron (ZOFRAN) IV   Vital Signs    Vitals:   05/17/16 2300 05/17/16 2315 05/18/16 0015 05/18/16 0419  BP: 102/82 101/74  93/75  Pulse: 90 91  85  Resp: 17 17  (!) 22  Temp:   98.3 F (36.8 C) 98 F (36.7 C)  TempSrc:   Oral Oral  SpO2: 99% 97%  100%  Weight:   194 lb 14.4 oz (88.4 kg) 194 lb 14.4 oz (88.4 kg)  Height:       No intake or output data in the 24 hours ending 05/18/16 0822 Filed Weights   05/17/16 1853 05/18/16 0015 05/18/16 0419  Weight: 190 lb (86.2 kg) 194 lb 14.4 oz (88.4 kg) 194 lb 14.4 oz (88.4 kg)    Physical Exam   GEN: Well nourished, well developed, female in emotional distress.  HEENT: Grossly normal.  Neck: Supple, no JVD, carotid bruits, or masses. Cardiac: RRR, no murmurs, rubs, or gallops. No clubbing, cyanosis, edema.  Radials/DP/PT 2+ and equal bilaterally.  Respiratory:  Respirations regular and unlabored, clear to auscultation bilaterally. GI: Soft, nontender, nondistended,  BS + x 4. MS: no deformity or atrophy. Skin: warm and dry, no rash. Neuro:  Strength and sensation are intact. Psych: AAOx3.  Normal affect.  Labs    CBC  Recent Labs  05/17/16 1902 05/18/16 0025 05/18/16 0157  WBC 11.3* 9.9 8.4  NEUTROABS 9.5*  --   --   HGB 13.2 12.6 12.1  HCT 39.4 37.8 36.7  MCV 90.4 89.8 90.2  PLT 277 257 [id number]   Basic Metabolic Panel  Recent Labs  05/17/16 1858 05/17/16 1902  NA 138 136  K 4.5 4.5  CL 106 105  CO2  --  20*  GLUCOSE 146* 142*  BUN 16 14  CREATININE 1.00 1.02*  CALCIUM  --  9.0   Liver Function Tests  Recent Labs  05/17/16 1902  AST 24  ALT 12*  ALKPHOS 76  BILITOT 0.5  PROT 6.3*  ALBUMIN 3.4*   Cardiac Enzymes  Recent Labs  05/17/16 2015 05/18/16 0157 05/18/16 0626  TROPONINI 2.64* 4.08* 1.31*     Telemetry    NSR, Sinus tach.  - Personally Reviewed  ECG     NSR, low voltage QRS. ST elevation has resolved.- Personally Reviewed  Radiology    Dg Chest Portable 1 View  Result Date: 05/17/2016 CLINICAL DATA:  Chest pain and shortness of breath EXAM: PORTABLE CHEST 1 VIEW COMPARISON:  March 11, 2016 FINDINGS: Hazy opacity in the right base is felt to represent a degree of pneumonia. There is scarring in the left apex region, stable. Lungs elsewhere clear. Heart size and pulmonary vascularity are normal. No adenopathy. There are multiple calcified nonenlarged lymph nodes consistent with prior granulomatous disease. Atherosclerotic calcification is noted in the aorta. No lesions. IMPRESSION: Opacity right base, consistent with pneumonia. Scarring left apex, stable. Evidence of prior granulomatous disease. No enlarged nodes. Stable cardiac silhouette. There is aortic atherosclerosis. Followup PA and lateral chest radiographs recommended in 3-4 weeks following trial of antibiotic therapy to ensure resolution and exclude underlying malignancy. Electronically Signed   By: Lowella Grip III M.D.   On: 05/17/2016  19:19    Cardiac Studies   Echo pending   Patient Profile     [name] is a 61 year old female with a past medical history of stress induced cardiomyopathy in (EF 30% in 2010 with improvement to 50-55%), normal cors 2010 & 2012, COPD, prior tobacco abuse, obesity, recent GIB who presented to [facility] with worsening SOB and chest/shoulder tightness.  Assessment & Plan    1. NSTEMI - Clinical scenario may be consistent with recurrent stress-induced cardiomyopathy as symptoms correlate with onset of stressful news.  Troponin 2.64>>4.08>>1.31. She is on heparin gtt and Nitro gtt. Still having chest pain this am, but is tearful and anxious. Normal cors by cath in 2012 and 2010.   Will cath today to rule out ischemic disease vs recurrent stress induced cardiomyopathy.   2. COPD - Stable. On home inhalers.   3. Recent GIB - hemoglobin stable on heparin gtt. Continue protonix.   4. CAP: On IV antibiotics per primary team.   The risks and benefits of a cardiac catheterization including, but not limited to, death, stroke, MI, kidney damage and bleeding were discussed with the patient who indicates understanding and agrees to proceed.    Signed, Arbutus Leas, NP  05/18/2016, 8:22 AM    Agree with note by Jettie Booze NP  Feeling better. Trop peaked at 4. EKG improved as well. Exam benign. Plan cor angio today to R/O Takasubo. Vs CAD  Lorretta Harp, M.D., [place], [facility], Laver[place] [address] [place], [place]  [address]  [phone] 05/18/2016 11:12 AM

## 2016-05-19 ENCOUNTER — Ambulatory Visit: Payer: Self-pay | Admitting: Nurse Practitioner

## 2016-05-19 ENCOUNTER — Encounter (HOSPITAL_COMMUNITY): Payer: Self-pay | Admitting: Cardiovascular Disease

## 2016-05-19 LAB — COMPREHENSIVE METABOLIC PANEL
ALK PHOS: 52 U/L (ref 38–126)
ALT: 10 U/L — ABNORMAL LOW (ref 14–54)
ANION GAP: 6 (ref 5–15)
AST: 18 U/L (ref 15–41)
Albumin: 3 g/dL — ABNORMAL LOW (ref 3.5–5.0)
BILIRUBIN TOTAL: 0.7 mg/dL (ref 0.3–1.2)
BUN: 13 mg/dL (ref 6–20)
CALCIUM: 8.6 mg/dL — AB (ref 8.9–10.3)
CO2: 22 mmol/L (ref 22–32)
CREATININE: 1.28 mg/dL — AB (ref 0.44–1.00)
Chloride: 110 mmol/L (ref 101–111)
GFR calc Af Amer: 52 mL/min — ABNORMAL LOW (ref >60)
GFR calc non Af Amer: 45 mL/min — ABNORMAL LOW (ref >60)
GLUCOSE: 103 mg/dL — AB (ref 65–99)
Potassium: 4.1 mmol/L (ref 3.5–5.1)
Sodium: 138 mmol/L (ref 135–145)
Total Protein: 5.4 g/dL — ABNORMAL LOW (ref 6.5–8.1)

## 2016-05-19 LAB — ECHOCARDIOGRAM COMPLETE
E decel time: 183 msec
E/e' ratio: 9.17
FS: 23 % — AB (ref 28–44)
Height: 67 in
IVS/LV PW RATIO, ED: 1.21
LA diam end sys: 39 mm
LA vol: 43.6 mL
LADIAMINDEX: 1.89 cm/m2
LASIZE: 39 mm
LAVOLA4C: 38.3 mL
LAVOLIN: 21.1 mL/m2
LV E/e' medial: 9.17
LV SIMPSON'S DISK: 32
LV TDI E'LATERAL: 9.68
LV dias vol index: 39 mL/m2
LV dias vol: 81 mL (ref 46–106)
LV e' LATERAL: 9.68 cm/s
LVEEAVG: 9.17
LVOT area: 4.15 cm2
LVOTD: 23 mm
LVSYSVOL: 55 mL — AB (ref 14–42)
LVSYSVOLIN: 27 mL/m2
MV Dec: 183
MV Peak grad: 3 mmHg
MV pk E vel: 88.8 m/s
MVPKAVEL: 54.3 m/s
PW: 7.64 mm — AB (ref 0.6–1.1)
RV TAPSE: 20.8 mm
RV sys press: 44 mmHg
Reg peak vel: 320 cm/s
Stroke v: 26 ml
TDI e' medial: 6.85
TR max vel: 320 cm/s
Weight: 3118.4 oz

## 2016-05-19 LAB — CBC WITH DIFFERENTIAL/PLATELET
Basophils Absolute: 0 10*3/uL (ref 0.0–0.1)
Basophils Relative: 1 %
Eosinophils Absolute: 0.1 10*3/uL (ref 0.0–0.7)
Eosinophils Relative: 2 %
HCT: 32.9 % — ABNORMAL LOW (ref 36.0–46.0)
HEMOGLOBIN: 10.8 g/dL — AB (ref 12.0–15.0)
LYMPHS ABS: 1.5 10*3/uL (ref 0.7–4.0)
LYMPHS PCT: 24 %
MCH: 30.1 pg (ref 26.0–34.0)
MCHC: 32.8 g/dL (ref 30.0–36.0)
MCV: 91.6 fL (ref 78.0–100.0)
MONOS PCT: 8 %
Monocytes Absolute: 0.5 10*3/uL (ref 0.1–1.0)
NEUTROS ABS: 4.1 10*3/uL (ref 1.7–7.7)
NEUTROS PCT: 65 %
Platelets: 186 10*3/uL (ref 150–400)
RBC: 3.59 MIL/uL — ABNORMAL LOW (ref 3.87–5.11)
RDW: 13.6 % (ref 11.5–15.5)
WBC: 6.3 10*3/uL (ref 4.0–10.5)

## 2016-05-19 LAB — HIV ANTIBODY (ROUTINE TESTING W REFLEX): HIV SCREEN 4TH GENERATION: NONREACTIVE

## 2016-05-19 LAB — MAGNESIUM: Magnesium: 2 mg/dL (ref 1.7–2.4)

## 2016-05-19 MED ORDER — SODIUM CHLORIDE 0.9 % IV BOLUS (SEPSIS)
500.0000 mL | Freq: Once | INTRAVENOUS | Status: AC
  Administered 2016-05-19: 500 mL via INTRAVENOUS

## 2016-05-19 MED ORDER — CARVEDILOL 3.125 MG PO TABS
3.1250 mg | ORAL_TABLET | Freq: Two times a day (BID) | ORAL | Status: DC
  Administered 2016-05-19 – 2016-05-20 (×2): 3.125 mg via ORAL
  Filled 2016-05-19 (×2): qty 1

## 2016-05-19 MED FILL — Heparin Sodium (Porcine) 2 Unit/ML in Sodium Chloride 0.9%: INTRAMUSCULAR | Qty: 500 | Status: AC

## 2016-05-19 NOTE — Progress Notes (Signed)
CARDIAC REHAB PHASE I   Pt recently returned from ambulating with nursing staff, tearful, declines additional ambulation at this time. Began MI/Takotsubo cardiomyopathy education.  Reviewed risk factors, MI book, Takotsubo/ cardiomyopathy handouts, s/s heart failure, activity restrictions, ntg, heart healthy diet and phase 2 cardiac rehab. Pt verbalized understanding. Pt agrees to phase 2 cardiac rehab referral, will send to [facility] per pt request. Pt does not have insurance. Will provide financial assistance form. Will follow up tomorrow for ambulation and to discuss exercise. Encouraged additional ambulation today as tolerated. Pt in recliner, call bell within reach. Will follow.     [place], RN, BSN 05/19/2016 12:24 PM

## 2016-05-19 NOTE — Progress Notes (Signed)
Subjective:  No CP/SOB, POD # 1 radial diagnostic cath.  Objective:  Temp:  [98.3 F (36.8 C)-99 F (37.2 C)] 98.3 F (36.8 C) (01/17 0813) Pulse Rate:  [0-84] 78 (01/17 0813) Resp:  [0-21] 15 (01/17 0813) BP: (70-115)/(46-76) 90/58 (01/17 0813) SpO2:  [0 %-100 %] 100 % (01/17 0813) Weight:  [195 lb 15.8 oz (88.9 kg)] 195 lb 15.8 oz (88.9 kg) (01/17 0455) Weight change: 5 lb 15.8 oz (2.716 kg)  Intake/Output from previous day: 01/16 0701 - 01/17 0700 In: 1174 [I.V.:724; IV Piggyback:450] Out: 500 [Urine:500]  Intake/Output from this shift: Total I/O In: 240 [P.O.:240] Out: -   Physical Exam: General appearance: alert and no distress Neck: no adenopathy, no carotid bruit, no JVD, supple, symmetrical, trachea midline and thyroid not enlarged, symmetric, no tenderness/mass/nodules Lungs: clear to auscultation bilaterally Heart: regular rate and rhythm, S1, S2 normal, no murmur, click, rub or gallop Extremities: extremities normal, atraumatic, no cyanosis or edema  Lab Results: Results for orders placed or performed during the hospital encounter of 05/17/16 (from the past 48 hour(s))  I-Stat Troponin, ED (not at [facility])     Status: Abnormal   Collection Time: 05/17/16  6:56 PM  Result Value Ref Range   Troponin i, poc 1.85 (HH) 0.00 - 0.08 ng/mL   Comment NOTIFIED PHYSICIAN    Comment 3            Comment: Due to the release kinetics of cTnI, a negative result within the first hours of the onset of symptoms does not rule out myocardial infarction with certainty. If myocardial infarction is still suspected, repeat the test at appropriate intervals.   I-stat chem 8, ed     Status: Abnormal   Collection Time: 05/17/16  6:58 PM  Result Value Ref Range   Sodium 138 135 - 145 mmol/L   Potassium 4.5 3.5 - 5.1 mmol/L   Chloride 106 101 - 111 mmol/L   BUN 16 6 - 20 mg/dL   Creatinine, Ser 1.00 0.44 - 1.00 mg/dL   Glucose, Bld 146 (H) 65 - 99 mg/dL   Calcium, Ion 1.14  (L) 1.15 - 1.40 mmol/L   TCO2 21 0 - 100 mmol/L   Hemoglobin 13.3 12.0 - 15.0 g/dL   HCT 39.0 36.0 - 46.0 %  CBC with Differential     Status: Abnormal   Collection Time: 05/17/16  7:02 PM  Result Value Ref Range   WBC 11.3 (H) 4.0 - 10.5 K/uL   RBC 4.36 3.87 - 5.11 MIL/uL   Hemoglobin 13.2 12.0 - 15.0 g/dL   HCT 39.4 36.0 - 46.0 %   MCV 90.4 78.0 - 100.0 fL   MCH 30.3 26.0 - 34.0 pg   MCHC 33.5 30.0 - 36.0 g/dL   RDW 13.2 11.5 - 15.5 %   Platelets 277 150 - 400 K/uL   Neutrophils Relative % 83 %   Neutro Abs 9.5 (H) 1.7 - 7.7 K/uL   Lymphocytes Relative 12 %   Lymphs Abs 1.4 0.7 - 4.0 K/uL   Monocytes Relative 3 %   Monocytes Absolute 0.4 0.1 - 1.0 K/uL   Eosinophils Relative 1 %   Eosinophils Absolute 0.1 0.0 - 0.7 K/uL   Basophils Relative 1 %   Basophils Absolute 0.1 0.0 - 0.1 K/uL  Comprehensive metabolic panel     Status: Abnormal   Collection Time: 05/17/16  7:02 PM  Result Value Ref Range   Sodium 136 135 - 145  mmol/L   Potassium 4.5 3.5 - 5.1 mmol/L   Chloride 105 101 - 111 mmol/L   CO2 20 (L) 22 - 32 mmol/L   Glucose, Bld 142 (H) 65 - 99 mg/dL   BUN 14 6 - 20 mg/dL   Creatinine, Ser 1.02 (H) 0.44 - 1.00 mg/dL   Calcium 9.0 8.9 - 10.3 mg/dL   Total Protein 6.3 (L) 6.5 - 8.1 g/dL   Albumin 3.4 (L) 3.5 - 5.0 g/dL   AST 24 15 - 41 U/L   ALT 12 (L) 14 - 54 U/L   Alkaline Phosphatase 76 38 - 126 U/L   Total Bilirubin 0.5 0.3 - 1.2 mg/dL   GFR calc non Af Amer 59 (L) >60 mL/min   GFR calc Af Amer >60 >60 mL/min    Comment: (NOTE) The eGFR has been calculated using the CKD EPI equation. This calculation has not been validated in all clinical situations. eGFR's persistently <60 mL/min signify possible Chronic Kidney Disease.    Anion gap 11 5 - 15  Protime-INR     Status: None   Collection Time: 05/17/16  7:02 PM  Result Value Ref Range   Prothrombin Time 12.9 11.4 - 15.2 seconds   INR 0.97   APTT     Status: None   Collection Time: 05/17/16  7:02 PM    Result Value Ref Range   aPTT 30 24 - 36 seconds  Troponin I     Status: Abnormal   Collection Time: 05/17/16  8:15 PM  Result Value Ref Range   Troponin I 2.64 (HH) <0.03 ng/mL    Comment: CRITICAL RESULT CALLED TO, READ BACK BY AND VERIFIED WITH: K. FIELDS RN [phone] [id number] GREEN R   CBC     Status: None   Collection Time: 05/18/16 12:25 AM  Result Value Ref Range   WBC 9.9 4.0 - 10.5 K/uL   RBC 4.21 3.87 - 5.11 MIL/uL   Hemoglobin 12.6 12.0 - 15.0 g/dL   HCT 37.8 36.0 - 46.0 %   MCV 89.8 78.0 - 100.0 fL   MCH 29.9 26.0 - 34.0 pg   MCHC 33.3 30.0 - 36.0 g/dL   RDW 13.2 11.5 - 15.5 %   Platelets 257 150 - 400 K/uL  Strep pneumoniae urinary antigen     Status: None   Collection Time: 05/18/16  1:22 AM  Result Value Ref Range   Strep Pneumo Urinary Antigen NEGATIVE NEGATIVE    Comment:        Infection due to S. pneumoniae cannot be absolutely ruled out since the antigen present may be below the detection limit of the test.   MRSA PCR Screening     Status: None   Collection Time: 05/18/16  1:30 AM  Result Value Ref Range   MRSA by PCR NEGATIVE NEGATIVE    Comment:        The GeneXpert MRSA Assay (FDA approved for NASAL specimens only), is one component of a comprehensive MRSA colonization surveillance program. It is not intended to diagnose MRSA infection nor to guide or monitor treatment for MRSA infections.   CBC     Status: None   Collection Time: 05/18/16  1:57 AM  Result Value Ref Range   WBC 8.4 4.0 - 10.5 K/uL   RBC 4.07 3.87 - 5.11 MIL/uL   Hemoglobin 12.1 12.0 - 15.0 g/dL   HCT 36.7 36.0 - 46.0 %   MCV 90.2 78.0 - 100.0 fL   MCH 29.7  26.0 - 34.0 pg   MCHC 33.0 30.0 - 36.0 g/dL   RDW 13.3 11.5 - 15.5 %   Platelets 224 150 - 400 K/uL  Troponin I     Status: Abnormal   Collection Time: 05/18/16  1:57 AM  Result Value Ref Range   Troponin I 4.08 (HH) <0.03 ng/mL    Comment: CRITICAL VALUE NOTED.  VALUE IS CONSISTENT WITH PREVIOUSLY REPORTED AND CALLED  VALUE.  Heparin level (unfractionated)     Status: None   Collection Time: 05/18/16  1:57 AM  Result Value Ref Range   Heparin Unfractionated 0.58 0.30 - 0.70 IU/mL    Comment:        IF HEPARIN RESULTS ARE BELOW EXPECTED VALUES, AND PATIENT DOSAGE HAS BEEN CONFIRMED, SUGGEST FOLLOW UP TESTING OF ANTITHROMBIN III LEVELS.   Troponin I     Status: Abnormal   Collection Time: 05/18/16  6:26 AM  Result Value Ref Range   Troponin I 1.31 (HH) <0.03 ng/mL    Comment: CRITICAL VALUE NOTED.  VALUE IS CONSISTENT WITH PREVIOUSLY REPORTED AND CALLED VALUE.  Heparin level (unfractionated)     Status: Abnormal   Collection Time: 05/18/16  6:26 AM  Result Value Ref Range   Heparin Unfractionated >2.20 (H) 0.30 - 0.70 IU/mL    Comment: RESULTS CONFIRMED BY MANUAL DILUTION        IF HEPARIN RESULTS ARE BELOW EXPECTED VALUES, AND PATIENT DOSAGE HAS BEEN CONFIRMED, SUGGEST FOLLOW UP TESTING OF ANTITHROMBIN III LEVELS.   CBC     Status: None   Collection Time: 05/18/16  9:46 AM  Result Value Ref Range   WBC 7.9 4.0 - 10.5 K/uL   RBC 4.18 3.87 - 5.11 MIL/uL   Hemoglobin 12.5 12.0 - 15.0 g/dL   HCT 38.0 36.0 - 46.0 %   MCV 90.9 78.0 - 100.0 fL   MCH 29.9 26.0 - 34.0 pg   MCHC 32.9 30.0 - 36.0 g/dL   RDW 13.5 11.5 - 15.5 %   Platelets 228 150 - 400 K/uL  Protime-INR     Status: None   Collection Time: 05/18/16 11:01 AM  Result Value Ref Range   Prothrombin Time 13.9 11.4 - 15.2 seconds   INR 1.95   Basic metabolic panel     Status: Abnormal   Collection Time: 05/18/16 11:01 AM  Result Value Ref Range   Sodium 138 135 - 145 mmol/L   Potassium 3.9 3.5 - 5.1 mmol/L   Chloride 108 101 - 111 mmol/L   CO2 23 22 - 32 mmol/L   Glucose, Bld 104 (H) 65 - 99 mg/dL   BUN 10 6 - 20 mg/dL   Creatinine, Ser 1.00 0.44 - 1.00 mg/dL   Calcium 9.1 8.9 - 10.3 mg/dL   GFR calc non Af Amer >60 >60 mL/min   GFR calc Af Amer >60 >60 mL/min    Comment: (NOTE) The eGFR has been calculated using the CKD  EPI equation. This calculation has not been validated in all clinical situations. eGFR's persistently <60 mL/min signify possible Chronic Kidney Disease.    Anion gap 7 5 - 15  HIV antibody     Status: None   Collection Time: 05/18/16  7:11 PM  Result Value Ref Range   HIV Screen 4th Generation wRfx Non Reactive Non Reactive    Comment: (NOTE) Performed At: [facility] [address] [place], [place] [id number] Lindon Romp MD PY:[id number]   Procalcitonin - Baseline  Status: None   Collection Time: 05/18/16  7:11 PM  Result Value Ref Range   Procalcitonin <0.10 ng/mL    Comment:        Interpretation: PCT (Procalcitonin) <= 0.5 ng/mL: Systemic infection (sepsis) is not likely. Local bacterial infection is possible. (NOTE)         ICU PCT Algorithm               Non ICU PCT Algorithm    ----------------------------     ------------------------------         PCT < 0.25 ng/mL                 PCT < 0.1 ng/mL     Stopping of antibiotics            Stopping of antibiotics       strongly encouraged.               strongly encouraged.    ----------------------------     ------------------------------       PCT level decrease by               PCT < 0.25 ng/mL       >= 80% from peak PCT       OR PCT 0.25 - 0.5 ng/mL          Stopping of antibiotics                                             encouraged.     Stopping of antibiotics           encouraged.    ----------------------------     ------------------------------       PCT level decrease by              PCT >= 0.25 ng/mL       < 80% from peak PCT        AND PCT >= 0.5 ng/mL            Continuin g antibiotics                                              encouraged.       Continuing antibiotics            encouraged.    ----------------------------     ------------------------------     PCT level increase compared          PCT > 0.5 ng/mL         with peak PCT AND          PCT >= 0.5 ng/mL              Escalation of antibiotics                                          strongly encouraged.      Escalation of antibiotics        strongly encouraged.   CBC     Status: Abnormal   Collection Time: 05/18/16 10:56 PM  Result Value Ref Range   WBC 6.8 4.0 - 10.5 K/uL  RBC 3.67 (L) 3.87 - 5.11 MIL/uL   Hemoglobin 11.1 (L) 12.0 - 15.0 g/dL   HCT 33.5 (L) 36.0 - 46.0 %   MCV 91.3 78.0 - 100.0 fL   MCH 30.2 26.0 - 34.0 pg   MCHC 33.1 30.0 - 36.0 g/dL   RDW 13.5 11.5 - 15.5 %   Platelets 193 150 - 400 K/uL  CBC with Differential/Platelet     Status: Abnormal   Collection Time: 05/19/16  5:05 AM  Result Value Ref Range   WBC 6.3 4.0 - 10.5 K/uL   RBC 3.59 (L) 3.87 - 5.11 MIL/uL   Hemoglobin 10.8 (L) 12.0 - 15.0 g/dL   HCT 32.9 (L) 36.0 - 46.0 %   MCV 91.6 78.0 - 100.0 fL   MCH 30.1 26.0 - 34.0 pg   MCHC 32.8 30.0 - 36.0 g/dL   RDW 13.6 11.5 - 15.5 %   Platelets 186 150 - 400 K/uL   Neutrophils Relative % 65 %   Neutro Abs 4.1 1.7 - 7.7 K/uL   Lymphocytes Relative 24 %   Lymphs Abs 1.5 0.7 - 4.0 K/uL   Monocytes Relative 8 %   Monocytes Absolute 0.5 0.1 - 1.0 K/uL   Eosinophils Relative 2 %   Eosinophils Absolute 0.1 0.0 - 0.7 K/uL   Basophils Relative 1 %   Basophils Absolute 0.0 0.0 - 0.1 K/uL  Comprehensive metabolic panel     Status: Abnormal   Collection Time: 05/19/16  5:05 AM  Result Value Ref Range   Sodium 138 135 - 145 mmol/L   Potassium 4.1 3.5 - 5.1 mmol/L   Chloride 110 101 - 111 mmol/L   CO2 22 22 - 32 mmol/L   Glucose, Bld 103 (H) 65 - 99 mg/dL   BUN 13 6 - 20 mg/dL   Creatinine, Ser 1.28 (H) 0.44 - 1.00 mg/dL   Calcium 8.6 (L) 8.9 - 10.3 mg/dL   Total Protein 5.4 (L) 6.5 - 8.1 g/dL   Albumin 3.0 (L) 3.5 - 5.0 g/dL   AST 18 15 - 41 U/L   ALT 10 (L) 14 - 54 U/L   Alkaline Phosphatase 52 38 - 126 U/L   Total Bilirubin 0.7 0.3 - 1.2 mg/dL   GFR calc non Af Amer 45 (L) >60 mL/min   GFR calc Af Amer 52 (L) >60 mL/min    Comment: (NOTE) The eGFR has been  calculated using the CKD EPI equation. This calculation has not been validated in all clinical situations. eGFR's persistently <60 mL/min signify possible Chronic Kidney Disease.    Anion gap 6 5 - 15  Magnesium     Status: None   Collection Time: 05/19/16  5:05 AM  Result Value Ref Range   Magnesium 2.0 1.7 - 2.4 mg/dL    Imaging: Imaging results have been reviewed  Tele- NSR   Assessment/Plan:   1. Principal Problem: 2.   NSTEMI (non-ST elevated myocardial infarction) ([place]) 3. Active Problems: 4.   HTN (hypertension) 5.   Hyperlipidemia 6.   COPD (chronic obstructive pulmonary disease) ([place]) 7.   History of GI bleed 8.   HCAP (healthcare-associated pneumonia) 8.   Time Spent Directly with Patient:  20 minutes  Length of Stay:  LOS: 1 day   POD # 1 radial diagnostic cath. Nl cors (like she has had 2X in the past ) with Takastubo Syndrom (Apical ballooning), EF 30-35%, Would start low dose Coreg and lisinopril (BP permitting). No further eval. Can  be D/Cd from our stand point. May want to wait until tomorrow if start meds today. Will arrange TOC 7 then ROV with me 4-6 weeks. Will re check 2D in 3 months.  Quay Burow 05/19/2016, 10:23 AM

## 2016-05-19 NOTE — Progress Notes (Signed)
[facility] Progress Note  Patient: [name] [id number]   PCP: Minerva Ends, MD DOB: 1955/05/28   DOA: 05/17/2016   DOS: 05/19/2016   Date of Service: the patient was seen and examined on 05/19/2016  Brief hospital course: Pt. with PMH of COPD, HTN, GI bleed, HLD, NICM with takatsubo with normal coronaries; admitted on 05/17/2016, with complaint of chest tightness and shortnes of breath , was found to have NSTEMI vs takatsubo. Currently further plan is cardiac cath.  Assessment and Plan: 1. Acute stress-induced cardiomyopathy Troponin 3-4.08, currently trending downwards. Was on heparin as well as nitroglycerin drip. Continues to have chest tightness. Cardiac catheterization shows normal coronaries with reduced EF. Beta blocker and ACE inhibitor is indicated but patient's blood pressure is running on the lower side at 4 we will only be able to use beta blocker at present.  2. Suspected healthcare associate of pneumonia. Ruled out Patient clinically does not appear to have any pneumonia. No leukocytosis. No fever. Chest x-ray shows a possible consolidation in the right lower lobe. Probable atelectasis as well as a repeat chest x-ray does not show any evidence of pneumonia, pro-calcitonin is negative, no leukocytosis, patient asymptomatic. I would stop IV antibiotics. We will continue to monitor cultures, pro-calcitonin, strep antigen, urine antigens for Legionella  3. Hypotension. Etiology currently unclear. Patient was running blood pressure in 70s and 90s. Orthostatic vitals are negative after IV fluid bolus. Appears to be volume responsive. We will initiate the patient on Coreg and monitor overnight for blood pressure response.  5. Dyslipidemia. Continue simvastatin.  6. History of COPD. Doesn't appear to have any acute flare up at present. Continue dulera.  Bowel regimen: last BM 05/17/2016 Diet: cardiac diet DVT Prophylaxis: on therapeutic  anticoagulation.  Advance goals of care discussion: full code  Family Communication: no family was present at bedside, at the time of interview. The pt provided permission to discuss medical plan with the family. Opportunity was given to ask question and all questions were answered satisfactorily.   Disposition:  Discharge to home. Expected discharge date: 05/20/2016, blood pressure stabilization  Consultants: cardiology Procedures: cardiac cath  Antibiotics: Anti-infectives    Start     Dose/Rate Route Frequency Ordered Stop   05/17/16 2200  ceFEPIme (MAXIPIME) 1 g in dextrose 5 % 50 mL IVPB  Status:  Discontinued     1 g 100 mL/hr over 30 Minutes Intravenous Every 8 hours 05/17/16 2112 05/17/16 2114   05/17/16 2200  vancomycin (VANCOCIN) IVPB 1000 mg/200 mL premix  Status:  Discontinued     1,000 mg 200 mL/hr over 60 Minutes Intravenous Every 12 hours 05/17/16 2114 05/19/16 1128   05/17/16 2130  ceFEPIme (MAXIPIME) 1 g in dextrose 5 % 50 mL IVPB  Status:  Discontinued     1 g 100 mL/hr over 30 Minutes Intravenous Every 8 hours 05/17/16 2114 05/19/16 1128        Subjective: feeling better, no chest pain but still has Mild chest tightness  Objective: Physical Exam: Vitals:   05/19/16 0455 05/19/16 0750 05/19/16 0813 05/19/16 1157  BP: (!) 70/46  (!) 90/58   Pulse: 70  78   Resp: 18  15   Temp: 99 F (37.2 C)  98.3 F (36.8 C) 98.1 F (36.7 C)  TempSrc: Oral  Oral Oral  SpO2: 93% 95% 100%   Weight: 88.9 kg (195 lb 15.8 oz)     Height:        Intake/Output Summary (Last  24 hours) at 05/19/16 1454 Last data filed at 05/19/16 0900  Gross per 24 hour  Intake          1414.03 ml  Output                0 ml  Net          1414.03 ml   Filed Weights   05/18/16 0015 05/18/16 0419 05/19/16 0455  Weight: 88.4 kg (194 lb 14.4 oz) 88.4 kg (194 lb 14.4 oz) 88.9 kg (195 lb 15.8 oz)    General: Alert, Awake and Oriented to Time, Place and Person. Appear in moderate  distress, affect appropriate Eyes: PERRL, Conjunctiva normal ENT: Oral Mucosa clear moist. Neck: difficult to assess JVD, no Abnormal Mass Or lumps Cardiovascular: S1 and S2 Present, aortic systolic Murmur, Respiratory: Bilateral Air entry equal and Decreased, no use of accessory muscle, basal Crackles, no wheezes Abdomen: Bowel Sound present, Soft and no tenderness Skin: no redness, no Rash, no induration Extremities: trace Pedal edema, no calf tenderness Neurologic: Grossly no focal neuro deficit. Bilaterally Equal motor strength   Data Reviewed: CBC:  Recent Labs Lab 05/17/16 1902 05/18/16 0025 05/18/16 0157 05/18/16 0946 05/18/16 2256 05/19/16 0505  WBC 11.3* 9.9 8.4 7.9 6.8 6.3  NEUTROABS 9.5*  --   --   --   --  4.1  HGB 13.2 12.6 12.1 12.5 11.1* 10.8*  HCT 39.4 37.8 36.7 38.0 33.5* 32.9*  MCV 90.4 89.8 90.2 90.9 91.3 91.6  PLT 277 257 224 228 [id number]   Basic Metabolic Panel:  Recent Labs Lab 05/17/16 1858 05/17/16 1902 05/18/16 1101 05/19/16 0505  NA 138 136 138 138  K 4.5 4.5 3.9 4.1  CL 106 105 108 110  CO2  --  20* 23 22  GLUCOSE 146* 142* 104* 103*  BUN 16 14 10 13   CREATININE 1.00 1.02* 1.00 1.28*  CALCIUM  --  9.0 9.1 8.6*  MG  --   --   --  2.0    Liver Function Tests:  Recent Labs Lab 05/17/16 1902 05/19/16 0505  AST 24 18  ALT 12* 10*  ALKPHOS 76 52  BILITOT 0.5 0.7  PROT 6.3* 5.4*  ALBUMIN 3.4* 3.0*   No results for input(s): LIPASE, AMYLASE in the last 168 hours. No results for input(s): AMMONIA in the last 168 hours. Coagulation Profile:  Recent Labs Lab 05/17/16 1902 05/18/16 1101  INR 0.97 1.07   Cardiac Enzymes:  Recent Labs Lab 05/17/16 2015 05/18/16 0157 05/18/16 0626  TROPONINI 2.64* 4.08* 1.31*   BNP (last 3 results) No results for input(s): PROBNP in the last 8760 hours.  CBG: No results for input(s): GLUCAP in the last 168 hours.  Studies: Dg Chest 2 View  Result Date: 05/18/2016 CLINICAL DATA:  Lung  consolidation. EXAM: CHEST  2 VIEW COMPARISON:  05/17/2016 FINDINGS: The cardio pericardial silhouette is enlarged. Interstitial markings are diffusely coarsened with chronic features. Left upper lobe scarring is stable in the interval. The right basilar airspace opacity seen previously has improved in the interval has almost completely resolved. The visualized bony structures of the thorax are intact. Telemetry leads overlie the chest. IMPRESSION: Interval marked improvement and right base opacity suggesting a component was related atelectasis. Stable scarring left upper lobe. Electronically Signed   By: Misty Stanley M.D.   On: 05/18/2016 15:49     Scheduled Meds: . aspirin EC  81 mg Oral Daily  . carvedilol  3.125 mg Oral  BID WC  . heparin  5,000 Units Subcutaneous Q8H  . mometasone-formoterol  2 puff Inhalation BID  . pantoprazole  40 mg Oral BID  . simvastatin  20 mg Oral QHS  . sodium chloride flush  3 mL Intravenous Q12H   Continuous Infusions:  PRN Meds: sodium chloride, acetaminophen, ALPRAZolam, ondansetron (ZOFRAN) IV, sodium chloride flush  Time spent: 30 minutes  Author: Berle Mull, MD [facility] Pager: [phone] 05/19/2016 2:54 PM  If 7PM-7AM, please contact night-coverage at [email or web address], password [facility]

## 2016-05-19 NOTE — Care Management Note (Addendum)
Case Management Note  Patient Details  Name: [name] MRN: [id number] Date of Birth: 1955-09-11  Subjective/Objective:  Pt presented for Nstemi. Post cardiac cath. Plan for d/c home once stable. Cardiology to tweak medications. Pt has PCP at the [facility]. Clinic is closed due to inclement weather.                   Action/Plan: Pt will need to call for hospital f/u appointment. No further needs from CM at this time.   Expected Discharge Date:                  Expected Discharge Plan:  [place]  In-House Referral:  NA  Discharge planning Services  CM Consult  Post Acute Care Choice:  NA Choice offered to:  NA  DME Arranged:  N/A DME Agency:  NA  HH Arranged:  NA HH Agency:  NA  Status of Service:  Completed, signed off  If discussed at [place] of Stay Meetings, dates discussed:    Additional Comments:  Bethena Roys, RN 05/19/2016, 10:40 AM

## 2016-05-20 DIAGNOSIS — Z0389 Encounter for observation for other suspected diseases and conditions ruled out: Secondary | ICD-10-CM

## 2016-05-20 LAB — CBC WITH DIFFERENTIAL/PLATELET
BASOS PCT: 1 %
Basophils Absolute: 0 10*3/uL (ref 0.0–0.1)
EOS ABS: 0.1 10*3/uL (ref 0.0–0.7)
EOS PCT: 2 %
HCT: 33.5 % — ABNORMAL LOW (ref 36.0–46.0)
Hemoglobin: 11 g/dL — ABNORMAL LOW (ref 12.0–15.0)
LYMPHS PCT: 22 %
Lymphs Abs: 1.3 10*3/uL (ref 0.7–4.0)
MCH: 30.2 pg (ref 26.0–34.0)
MCHC: 32.8 g/dL (ref 30.0–36.0)
MCV: 92 fL (ref 78.0–100.0)
Monocytes Absolute: 0.5 10*3/uL (ref 0.1–1.0)
Monocytes Relative: 8 %
Neutro Abs: 4.1 10*3/uL (ref 1.7–7.7)
Neutrophils Relative %: 67 %
PLATELETS: 173 10*3/uL (ref 150–400)
RBC: 3.64 MIL/uL — AB (ref 3.87–5.11)
RDW: 13.6 % (ref 11.5–15.5)
WBC: 6.1 10*3/uL (ref 4.0–10.5)

## 2016-05-20 LAB — PROCALCITONIN

## 2016-05-20 LAB — MAGNESIUM: Magnesium: 1.9 mg/dL (ref 1.7–2.4)

## 2016-05-20 MED ORDER — CARVEDILOL 3.125 MG PO TABS: 3.1250 mg | ORAL_TABLET | Freq: Two times a day (BID) | ORAL | 0 refills | Status: DC

## 2016-05-20 MED ORDER — CARVEDILOL 6.25 MG PO TABS
6.2500 mg | ORAL_TABLET | Freq: Two times a day (BID) | ORAL | 0 refills | Status: DC
Start: 2016-05-20 — End: 2016-05-31

## 2016-05-20 MED FILL — ?CARVEDILOL 6.25 MG TABLET: 6.25 | 30 days supply | Qty: 60 | Fill #0

## 2016-05-20 NOTE — Progress Notes (Signed)
Subjective:  No CP/SOB VSS  Objective:  Temp:  [98.1 F (36.7 C)-98.6 F (37 C)] 98.5 F (36.9 C) (01/18 0719) Pulse Rate:  [71-81] 80 (01/18 0719) Resp:  [16-21] 20 (01/18 0719) BP: (88-110)/(65-84) 104/77 (01/18 0719) SpO2:  [94 %-100 %] 97 % (01/18 0759) Weight:  [192 lb 14.4 oz (87.5 kg)] 192 lb 14.4 oz (87.5 kg) (01/18 0426) Weight change: -3 lb 1.4 oz (-1.4 kg)  Intake/Output from previous day: 01/17 0701 - 01/18 0700 In: 1570 [P.O.:840; IV Piggyback:650] Out: 400 [Urine:400]  Intake/Output from this shift: No intake/output data recorded.  Physical Exam: General appearance: alert and no distress Neck: no adenopathy, no carotid bruit, no JVD, supple, symmetrical, trachea midline and thyroid not enlarged, symmetric, no tenderness/mass/nodules Lungs: clear to auscultation bilaterally Heart: regular rate and rhythm, S1, S2 normal, no murmur, click, rub or gallop Extremities: extremities normal, atraumatic, no cyanosis or edema  Lab Results: Results for orders placed or performed during the hospital encounter of 05/17/16 (from the past 48 hour(s))  Protime-INR     Status: None   Collection Time: 05/18/16 11:01 AM  Result Value Ref Range   Prothrombin Time 13.9 11.4 - 15.2 seconds   INR 5.73   Basic metabolic panel     Status: Abnormal   Collection Time: 05/18/16 11:01 AM  Result Value Ref Range   Sodium 138 135 - 145 mmol/L   Potassium 3.9 3.5 - 5.1 mmol/L   Chloride 108 101 - 111 mmol/L   CO2 23 22 - 32 mmol/L   Glucose, Bld 104 (H) 65 - 99 mg/dL   BUN 10 6 - 20 mg/dL   Creatinine, Ser 1.00 0.44 - 1.00 mg/dL   Calcium 9.1 8.9 - 10.3 mg/dL   GFR calc non Af Amer >60 >60 mL/min   GFR calc Af Amer >60 >60 mL/min    Comment: (NOTE) The eGFR has been calculated using the CKD EPI equation. This calculation has not been validated in all clinical situations. eGFR's persistently <60 mL/min signify possible Chronic Kidney Disease.    Anion gap 7 5 - 15    Culture, blood (routine x 2) Call MD if unable to obtain prior to antibiotics being given     Status: None (Preliminary result)   Collection Time: 05/18/16  7:11 PM  Result Value Ref Range   Specimen Description LEFT ANTECUBITAL    Special Requests BOTTLES DRAWN AEROBIC ONLY 5CC    Culture NO GROWTH < 24 HOURS    Report Status PENDING   HIV antibody     Status: None   Collection Time: 05/18/16  7:11 PM  Result Value Ref Range   HIV Screen 4th Generation wRfx Non Reactive Non Reactive    Comment: (NOTE) Performed At: [facility] [place], [place] [id number] Lindon Romp MD [id number]   Procalcitonin - Baseline     Status: None   Collection Time: 05/18/16  7:11 PM  Result Value Ref Range   Procalcitonin <0.10 ng/mL    Comment:        Interpretation: PCT (Procalcitonin) <= 0.5 ng/mL: Systemic infection (sepsis) is not likely. Local bacterial infection is possible. (NOTE)         ICU PCT Algorithm               Non ICU PCT Algorithm    ----------------------------     ------------------------------         PCT < 0.25 ng/mL  PCT < 0.1 ng/mL     Stopping of antibiotics            Stopping of antibiotics       strongly encouraged.               strongly encouraged.    ----------------------------     ------------------------------       PCT level decrease by               PCT < 0.25 ng/mL       >= 80% from peak PCT       OR PCT 0.25 - 0.5 ng/mL          Stopping of antibiotics                                             encouraged.     Stopping of antibiotics           encouraged.    ----------------------------     ------------------------------       PCT level decrease by              PCT >= 0.25 ng/mL       < 80% from peak PCT        AND PCT >= 0.5 ng/mL            Continuin g antibiotics                                              encouraged.       Continuing antibiotics            encouraged.    ----------------------------      ------------------------------     PCT level increase compared          PCT > 0.5 ng/mL         with peak PCT AND          PCT >= 0.5 ng/mL             Escalation of antibiotics                                          strongly encouraged.      Escalation of antibiotics        strongly encouraged.   Culture, blood (routine x 2) Call MD if unable to obtain prior to antibiotics being given     Status: None (Preliminary result)   Collection Time: 05/18/16  7:16 PM  Result Value Ref Range   Specimen Description BLOOD LEFT HAND    Special Requests IN PEDIATRIC BOTTLE 3CC    Culture NO GROWTH < 24 HOURS    Report Status PENDING   CBC     Status: Abnormal   Collection Time: 05/18/16 10:56 PM  Result Value Ref Range   WBC 6.8 4.0 - 10.5 K/uL   RBC 3.67 (L) 3.87 - 5.11 MIL/uL   Hemoglobin 11.1 (L) 12.0 - 15.0 g/dL   HCT 33.5 (L) 36.0 - 46.0 %   MCV 91.3 78.0 - 100.0 fL   MCH 30.2 26.0 - 34.0 pg   MCHC 33.1  30.0 - 36.0 g/dL   RDW 13.5 11.5 - 15.5 %   Platelets 193 150 - 400 K/uL  CBC with Differential/Platelet     Status: Abnormal   Collection Time: 05/19/16  5:05 AM  Result Value Ref Range   WBC 6.3 4.0 - 10.5 K/uL   RBC 3.59 (L) 3.87 - 5.11 MIL/uL   Hemoglobin 10.8 (L) 12.0 - 15.0 g/dL   HCT 32.9 (L) 36.0 - 46.0 %   MCV 91.6 78.0 - 100.0 fL   MCH 30.1 26.0 - 34.0 pg   MCHC 32.8 30.0 - 36.0 g/dL   RDW 13.6 11.5 - 15.5 %   Platelets 186 150 - 400 K/uL   Neutrophils Relative % 65 %   Neutro Abs 4.1 1.7 - 7.7 K/uL   Lymphocytes Relative 24 %   Lymphs Abs 1.5 0.7 - 4.0 K/uL   Monocytes Relative 8 %   Monocytes Absolute 0.5 0.1 - 1.0 K/uL   Eosinophils Relative 2 %   Eosinophils Absolute 0.1 0.0 - 0.7 K/uL   Basophils Relative 1 %   Basophils Absolute 0.0 0.0 - 0.1 K/uL  Comprehensive metabolic panel     Status: Abnormal   Collection Time: 05/19/16  5:05 AM  Result Value Ref Range   Sodium 138 135 - 145 mmol/L   Potassium 4.1 3.5 - 5.1 mmol/L   Chloride 110 101 - 111 mmol/L    CO2 22 22 - 32 mmol/L   Glucose, Bld 103 (H) 65 - 99 mg/dL   BUN 13 6 - 20 mg/dL   Creatinine, Ser 1.28 (H) 0.44 - 1.00 mg/dL   Calcium 8.6 (L) 8.9 - 10.3 mg/dL   Total Protein 5.4 (L) 6.5 - 8.1 g/dL   Albumin 3.0 (L) 3.5 - 5.0 g/dL   AST 18 15 - 41 U/L   ALT 10 (L) 14 - 54 U/L   Alkaline Phosphatase 52 38 - 126 U/L   Total Bilirubin 0.7 0.3 - 1.2 mg/dL   GFR calc non Af Amer 45 (L) >60 mL/min   GFR calc Af Amer 52 (L) >60 mL/min    Comment: (NOTE) The eGFR has been calculated using the CKD EPI equation. This calculation has not been validated in all clinical situations. eGFR's persistently <60 mL/min signify possible Chronic Kidney Disease.    Anion gap 6 5 - 15  Magnesium     Status: None   Collection Time: 05/19/16  5:05 AM  Result Value Ref Range   Magnesium 2.0 1.7 - 2.4 mg/dL  Procalcitonin     Status: None   Collection Time: 05/20/16  4:40 AM  Result Value Ref Range   Procalcitonin <0.10 ng/mL    Comment:        Interpretation: PCT (Procalcitonin) <= 0.5 ng/mL: Systemic infection (sepsis) is not likely. Local bacterial infection is possible. (NOTE)         ICU PCT Algorithm               Non ICU PCT Algorithm    ----------------------------     ------------------------------         PCT < 0.25 ng/mL                 PCT < 0.1 ng/mL     Stopping of antibiotics            Stopping of antibiotics       strongly encouraged.  strongly encouraged.    ----------------------------     ------------------------------       PCT level decrease by               PCT < 0.25 ng/mL       >= 80% from peak PCT       OR PCT 0.25 - 0.5 ng/mL          Stopping of antibiotics                                             encouraged.     Stopping of antibiotics           encouraged.    ----------------------------     ------------------------------       PCT level decrease by              PCT >= 0.25 ng/mL       < 80% from peak PCT        AND PCT >= 0.5 ng/mL             Continuin g antibiotics                                              encouraged.       Continuing antibiotics            encouraged.    ----------------------------     ------------------------------     PCT level increase compared          PCT > 0.5 ng/mL         with peak PCT AND          PCT >= 0.5 ng/mL             Escalation of antibiotics                                          strongly encouraged.      Escalation of antibiotics        strongly encouraged.   CBC with Differential/Platelet     Status: Abnormal   Collection Time: 05/20/16  4:40 AM  Result Value Ref Range   WBC 6.1 4.0 - 10.5 K/uL   RBC 3.64 (L) 3.87 - 5.11 MIL/uL   Hemoglobin 11.0 (L) 12.0 - 15.0 g/dL   HCT 33.5 (L) 36.0 - 46.0 %   MCV 92.0 78.0 - 100.0 fL   MCH 30.2 26.0 - 34.0 pg   MCHC 32.8 30.0 - 36.0 g/dL   RDW 13.6 11.5 - 15.5 %   Platelets 173 150 - 400 K/uL   Neutrophils Relative % 67 %   Neutro Abs 4.1 1.7 - 7.7 K/uL   Lymphocytes Relative 22 %   Lymphs Abs 1.3 0.7 - 4.0 K/uL   Monocytes Relative 8 %   Monocytes Absolute 0.5 0.1 - 1.0 K/uL   Eosinophils Relative 2 %   Eosinophils Absolute 0.1 0.0 - 0.7 K/uL   Basophils Relative 1 %   Basophils Absolute 0.0 0.0 - 0.1 K/uL  Magnesium     Status: None   Collection Time: 05/20/16  4:40 AM  Result  Value Ref Range   Magnesium 1.9 1.7 - 2.4 mg/dL    Imaging: Imaging results have been reviewed  Tele- NSR  Assessment/Plan:   1. Principal Problem: 2.   NSTEMI (non-ST elevated myocardial infarction) ([place]) 3. Active Problems: 4.   HTN (hypertension) 5.   Hyperlipidemia 6.   COPD (chronic obstructive pulmonary disease) ([place]) 7.   History of GI bleed 8.   HCAP (healthcare-associated pneumonia) 76.   Time Spent Directly with Patient:  20 minutes  Length of Stay:  LOS: 2 days   Admitted 1/15 with CP/SOB. NSTEMI. Nl cors at cath. Apical ballooning c/w Takatsubo syndrome. Coreg started. Exam benign. OK for DC from our standpoint. Will need  TOC 7 for BB titration. ROV with Dr Debara Pickett 4-6 weeks. OP 2D in 3 months.  Quay Burow 05/20/2016, 9:47 AM

## 2016-05-20 NOTE — Progress Notes (Signed)
CARDIAC REHAB PHASE I   PRE:  Rate/Rhythm: 91 SR  BP:  Supine:   Sitting: 113/83  Standing:    SaO2: 95 RA  MODE:  Ambulation: 350 ft   POST:  Rate/Rhythm: 111 ST  BP:  Supine:   Sitting: 121/84  Standing:    SaO2: 95 RA [id number] Assisted X 1 to ambulate. Gait steady. VS stable. Pt walked 350 feet without c/o other than some back pain from being in bed.Completed discharge education with pt and gave her exercise guidelines. Also gave pt financial form to fill out for Outpt. CRP.  Rodney Langton RN 05/20/2016 10:11 AM

## 2016-05-20 NOTE — Progress Notes (Signed)
PT Cancellation and Discharge Note  Patient Details Name: [name] MRN: [id number] DOB: Mar 26, 1956   Cancelled Treatment:    Reason Eval/Treat Not Completed: PT screened, no needs identified, will sign off.  Pt observed up ambulating with Cardiac Rehab and moving well.  Spoke with RN who agrees pt is doing well and not acute PT needs at this time.  Will sign off.     Jinny Blossom F Dewarren Ledbetter 05/20/2016, 11:59 AM

## 2016-05-21 LAB — LEGIONELLA PNEUMOPHILA SEROGP 1 UR AG: L. PNEUMOPHILA SEROGP 1 UR AG: NEGATIVE

## 2016-05-23 LAB — CULTURE, BLOOD (ROUTINE X 2)
CULTURE: NO GROWTH
Culture: NO GROWTH

## 2016-05-25 NOTE — Addendum Note (Signed)
Addended by: Boykin Nearing on: 05/25/2016 08:36 AM   Modules accepted: Orders
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
## 2016-05-26 ENCOUNTER — Telehealth: Payer: Self-pay | Admitting: Internal Medicine

## 2016-05-26 NOTE — Telephone Encounter (Signed)
Closed Encounter  °

## 2016-05-30 NOTE — Progress Notes (Signed)
Cardiology Office Note    Date:  05/31/2016   ID:  [name], DOB [birth year], MRN [id number]  PCP:  Minerva Ends, MD  Cardiologist: Dr. Debara Pickett   Chief Complaint  Patient presents with  . Hospitalization Follow-up    History of Present Illness:    [name] is a 61 y.o. female with past medical history of nonischemic cardiomyopathy (EF 30% in 2010 with normal cors at that time, EF improved to 50-55% in 2013), HTN, COPD, and prior tobacco use who presents to the office for hospital follow-up.   She was recently admitted from from 1/15 - 05/20/2016 for an NSTEMI. Troponin values peaked at 4.08 and EKG showing minimal ST elevation which did not meet STEMI criteria. Echocardiogram showed a reduced EF of 25-30% with akinesis of the entire distal half of the LV including the apex and apical RV. A cardiac catheterization was performed which showed normal cors with periapical akinesis, therefore being consistent with acute takotsubo syndrome. She was started on Coreg 3.125mg  BID at the time of discharge and continued on ASA 81mg  along with Zocor 20mg  daily.   In talking with the patient today, she denies any recurrent episodes of chest discomfort or shortness of breath. She reports being under a large amount of stress and feels like this contributed to her recent hospitalization. She does not elaborate on this but her daughter who is present during the encounter agrees that she is under significant stress. She reports having increased fatigue and a decreased appetite for the past several months.  She denies any dyspnea with exertion, orthopnea, PND, or lower extremity edema.  She has been taking Coreg 3.125 mg twice a day (listed at 6.25mg  BID but she has been cutting this in half). She takes Lisinopril 10mg  daily as needed for elevated BP. Reports her BP can be elevated in the 150's/90's in the morning and she will take Lisinopril 10mg , with improvement in her readings but  significant fatigue in the afternoon. BP the following day is usually in the 90's /70's.  She takes this 1-2 times per week.    Past Medical History:  Diagnosis Date  . CHF (congestive heart failure) ([place])   . COPD (chronic obstructive pulmonary disease) ([place])   . GI bleed 05/2016  . Hyperlipidemia   . Hypertension   . Nonischemic cardiomyopathy ([place])    a. stress-induced cardiomyopathy by cath 2010 (EF % at that time) - normal cors 2010 & 2012. b. 2013: echo showing EF of 50-55%. c. 05/2016: NSTEMI with cath showing normal cors and periapical akniesis --> consistent with Takotsubo Cardiomyopathy. EF down to 25-30%.  . Obesity   . Takotsubo cardiomyopathy   . Thyroid disease   . Tobacco abuse     Past Surgical History:  Procedure Laterality Date  . CARDIAC CATHETERIZATION  2010   no signficant CAD. EF 30%, apical ballooning, takotsubo cardiomyopathy  . CARDIAC CATHETERIZATION N/A 05/18/2016   Procedure: Left Heart Cath and Coronary Angiography;  Surgeon: Sherren Mocha, MD;  Location: [facility];  Service: Cardiovascular;  Laterality: N/A;  . TRANSTHORACIC ECHOCARDIOGRAM  05/2011   EF 50-55%    Current Medications: Outpatient Medications Prior to Visit  Medication Sig Dispense Refill  . albuterol (PROVENTIL HFA;VENTOLIN HFA) 108 (90 Base) MCG/ACT inhaler Inhale 2 puffs into the lungs every 4 (four) hours as needed for wheezing or shortness of breath. 1 Inhaler 5  . aspirin EC 81 MG tablet Take 1 tablet (81 mg  total) by mouth daily. 30 tablet 11  . budesonide-formoterol (SYMBICORT) 160-4.5 MCG/ACT inhaler Inhale 2 puffs into the lungs 2 (two) times daily. Via patient assistance, patient has paperwork 3 Inhaler 3  . ranitidine (ZANTAC) 150 MG tablet Take 150 mg by mouth daily as needed for heartburn.    . carvedilol (COREG) 6.25 MG tablet Take 1 tablet (6.25 mg total) by mouth 2 (two) times daily with a meal. 60 tablet 0  . simvastatin (ZOCOR) 20 MG tablet Take 1 tablet (20 mg  total) by mouth every evening. (Patient taking differently: Take 20 mg by mouth at bedtime. ) 30 tablet 11   No facility-administered medications prior to visit.      Allergies:   Patient has no known allergies.   Social History   Social History  . Marital status: Widowed    Spouse name: N/A  . Number of children: 2  . Years of education: N/A   Social History Main Topics  . Smoking status: Former Smoker    Quit date: 04/03/2003  . Smokeless tobacco: Never Used  . Alcohol use No  . Drug use: No  . Sexual activity: Not Asked   Other Topics Concern  . None   Social History Narrative  . None     Family History:  The patient's family history includes Breast cancer in her maternal grandmother; COPD in her father; Heart disease in her brother; Lung cancer in her mother.   Review of Systems:   Please see the history of present illness.     General:  No chills, fever, night sweats or weight changes. Positive for fatigue and decreased appetite.  Cardiovascular:  No chest pain, dyspnea on exertion, edema, orthopnea, palpitations, paroxysmal nocturnal dyspnea. Dermatological: No rash, lesions/masses Respiratory: No cough, dyspnea Urologic: No hematuria, dysuria Abdominal:   No nausea, vomiting, diarrhea, bright red blood per rectum, melena, or hematemesis Neurologic:  No visual changes, wkns, changes in mental status. All other systems reviewed and are otherwise negative except as noted above.   Physical Exam:    VS:  BP 127/84   Pulse 80   Ht 5\' 7"  (1.702 m)   Wt 186 lb (84.4 kg)   BMI 29.13 kg/m    General: Well developed, well nourished Caucasian female appearing in no acute distress. Head: Normocephalic, atraumatic, sclera non-icteric, no xanthomas, nares are without discharge.  Neck: No carotid bruits. JVD not elevated.  Lungs: Respirations regular and unlabored, without wheezes or rales.  Heart: Regular rate and rhythm. No S3 or S4.  No murmur, no rubs, or gallops  appreciated. Abdomen: Soft, non-tender, non-distended with normoactive bowel sounds. No hepatomegaly. No rebound/guarding. No obvious abdominal masses. Msk:  Strength and tone appear normal for age. No joint deformities or effusions. Extremities: No clubbing or cyanosis. No edema.  Distal pedal pulses are 2+ bilaterally. Radial cath site without ecchymosis or evidence of a hematoma.  Neuro: Alert and oriented X 3. Moves all extremities spontaneously. No focal deficits noted. Psych:  Responds to questions appropriately with a flat affect. Skin: No rashes or lesions noted  Wt Readings from Last 3 Encounters:  05/31/16 186 lb (84.4 kg)  05/20/16 192 lb 14.4 oz (87.5 kg)  03/15/16 190 lb 3.2 oz (86.3 kg)     Studies/Labs Reviewed:   EKG:  EKG is not ordered today.    Recent Labs: 05/19/2016: ALT 10; BUN 13; Creatinine, Ser 1.28; Potassium 4.1; Sodium 138 05/20/2016: Hemoglobin 11.0; Magnesium 1.9; Platelets 173   Lipid  Panel    Component Value Date/Time   CHOL 179 02/16/2016 1136   TRIG 82 02/16/2016 1136   HDL 58 02/16/2016 1136   CHOLHDL 3.1 02/16/2016 1136   VLDL 16 02/16/2016 1136   LDLCALC 105 02/16/2016 1136    Additional studies/ records that were reviewed today include:   Echocardiogram: 05/18/2016 Study Conclusions  - Left ventricle: The cavity size was at the upper limits of   normal. Wall thickness was normal. Systolic function was severely   reduced. The estimated ejection fraction was in the range of 25%   to 30%. There was akinesis of the entire distal half of the LV   including the apex and apical RV as well. Cannot exclude   tako-tsubo cardiomyopathy. Features are consistent with a   pseudonormal left ventricular filling pattern, with concomitant   abnormal relaxation and increased filling pressure (grade 2   diastolic dysfunction). - Left atrium: The atrium was mildly dilated. - Right ventricle: Hypokinesis of the RV apex. - Pulmonary arteries: PA peak  pressure: 44 mm Hg (S).  Assessment:    1. Takotsubo cardiomyopathy   2. Nonischemic cardiomyopathy ([place])   3. Essential hypertension      Plan:   In order of problems listed above:  1. Takotsubo Cardiomyopathy/Nonischemic Cardiomyopathy - EF 30% in 2010 with normal cors at that time, EF improved to 50-55% in 2013. Recently admitted for an NSTEMI with troponin peak at 4.08 and echocardiogram showing a reduced EF of 25-30% with akinesis of the entire distal half of the LV including the apex and apical RV. Cath with normal cors with periapical akinesis, therefore being consistent with acute takotsubo syndrome. - started on Coreg 3.125mg  BID, along with statin therapy and ASA.   - tolerating BB dosing well at this time but reports fatigue (unsure if related to BB dosing, but likely culprit is her significant life stressors as she was on BB therapy prior to her NSTEMI). Will start daily low-dose ACE-I with Lisinopril 2.5mg  daily. Patient will check her BP regularly.  - consider repeat echocardiogram in 3 months to assess LV function.   2. HTN - BP at 127/84 during today's visit. Reports having spikes in her BP up to 150's/90's and taking Lisinopril 10mg  with subsequent episodes of hypotension. Takes this 1-2 times per week with symptoms reported as above. - recommended she take a low dose ACE-I daily to help with BP and avoid these fluctuations as sporadic doses of this are likely contributing to her hypotension. - continue Coreg 3.125mg  BID. Will start Lisinopril 2.5mg  daily. Will check BP daily. If SBP < 100, instructed to hold Lisinopril.   3. Life Stressors - she does not elaborate on her specific issues during today's visit but does report having increased fatigue and a decreased appetite. Has a flat affect during examination.  - I recommended she follow-up with her PCP as she may benefit from counseling to further discuss her issues.    Medication Adjustments/Labs and Tests  Ordered: Current medicines are reviewed at length with the patient today.  Concerns regarding medicines are outlined above.  Medication changes, Labs and Tests ordered today are listed in the Patient Instructions below. Patient Instructions  Medication Instructions:  START taking lisinopril 2.5 mg (1 tablet) one time daily.    Labwork: NONE  Testing/Procedures: NONE  Follow-Up: Your physician wants you to follow-up in: 3 MONTHS WITH DR. HILTY. You will receive a reminder letter in the mail two months in advance. If you don't  receive a letter, please call our office to schedule the follow-up appointment.  If you need a refill on your cardiac medications before your next appointment, please call your pharmacy.   Signed, Erma Heritage, [place]  05/31/2016 2:39 PM    [facility] [place], [place], [place]  [address] Phone: [phone]; Fax: [phone]  [address] [place], [place] [address] Phone: [phone]

## 2016-05-31 ENCOUNTER — Encounter: Payer: Self-pay | Admitting: Student

## 2016-05-31 ENCOUNTER — Ambulatory Visit (INDEPENDENT_AMBULATORY_CARE_PROVIDER_SITE_OTHER): Payer: Self-pay | Admitting: Student

## 2016-05-31 VITALS — BP 127/84 | HR 80 | Ht 67.0 in | Wt 186.0 lb

## 2016-05-31 DIAGNOSIS — I428 Other cardiomyopathies: Secondary | ICD-10-CM

## 2016-05-31 DIAGNOSIS — I10 Essential (primary) hypertension: Secondary | ICD-10-CM

## 2016-05-31 DIAGNOSIS — I5181 Takotsubo syndrome: Secondary | ICD-10-CM

## 2016-05-31 MED ORDER — CARVEDILOL 3.125 MG PO TABS: 3.1250 mg | ORAL_TABLET | Freq: Two times a day (BID) | ORAL | Status: DC

## 2016-05-31 MED ORDER — LISINOPRIL 2.5 MG PO TABS: 2.5000 mg | ORAL_TABLET | Freq: Every day | ORAL | 3 refills | Status: DC

## 2016-05-31 NOTE — Patient Instructions (Signed)
Medication Instructions:  START taking lisinopril 2.5 mg (1 tablet) one time daily.    Labwork: NONE  Testing/Procedures: NONE  Follow-Up: Your physician wants you to follow-up in: 3 MONTHS WITH DR. HILTY. You will receive a reminder letter in the mail two months in advance. If you don't receive a letter, please call our office to schedule the follow-up appointment.    If you need a refill on your cardiac medications before your next appointment, please call your pharmacy.

## 2016-07-08 ENCOUNTER — Ambulatory Visit: Payer: Self-pay | Admitting: Internal Medicine

## 2016-07-26 ENCOUNTER — Encounter: Payer: Self-pay | Admitting: Internal Medicine

## 2016-07-26 ENCOUNTER — Ambulatory Visit (INDEPENDENT_AMBULATORY_CARE_PROVIDER_SITE_OTHER): Payer: No Typology Code available for payment source | Admitting: Internal Medicine

## 2016-07-26 VITALS — BP 130/70 | HR 73 | Ht 67.0 in | Wt 193.6 lb

## 2016-07-26 DIAGNOSIS — I5181 Takotsubo syndrome: Secondary | ICD-10-CM

## 2016-07-26 DIAGNOSIS — I10 Essential (primary) hypertension: Secondary | ICD-10-CM

## 2016-07-26 DIAGNOSIS — I428 Other cardiomyopathies: Secondary | ICD-10-CM

## 2016-07-26 NOTE — Patient Instructions (Addendum)
Your physician recommends that you schedule a follow-up appointment in: 3 months (after echo)  Your physician has requested that you have an echocardiogram @ [address] - [address]. Echocardiography is a painless test that uses sound waves to create images of your heart. It provides your doctor with information about the size and shape of your heart and how well your heart's chambers and valves are working. This procedure takes approximately one hour. There are no restrictions for this procedure.

## 2016-07-26 NOTE — Progress Notes (Signed)
OFFICE NOTE  Chief Complaint:  Hospital follow-up  Primary Care Physician: Renee Ends, MD  HPI:  [name] is a 61 year old female who was previously followed by [name] with [facility] with a history of nonischemic cardiomyopathy which was diagnosed as a stress-induced cardiomyopathy by catheterization in 08-05-08. EF was 30% at that time with apical ballooning and relative basilar hypercontractility consistent with a takotsubo cardiomyopathy. Her EF improved very quickly after that, after starting on appropriate medical therapy. She has been slowly having her blood pressure medicines up titrated, including carvedilol and lisinopril. She has had no significant palpitations, chest pain, or other symptoms since about this time last year when she was admitted for a second episode of heart failure also thought to be due to a stress-induced cardiomyopathy. She did have a history of alcohol use in the past, drinking about 3 or 4 beers per day, which has stopped completely, and there does not seem to be any precipitating factors for this cardiomyopathy. Recently, she was apparently diagnosed with COPD and has been taking Symbicort which helps her symptoms.  07/26/2016  [name] returns today for follow-up. I did not see nurse's August 05, 2012. She has a history of stress-induced cardio myopathy with EF as low as 30% in the past however improved up to 50%. She was recently admitted in the hospital with another acute distress episode and non-ST elevation MI with troponin greater than 4. Cardiac catheterization was performed on 05/18/2016 which demonstrated normal coronary arteries, severe LV dysfunction with EF 30-35% and apical akinesis, consistent with recurrent stress-induced cardiomyopathy. This is been compensated by hypotension.  PMHx:  Past Medical History:  Diagnosis Date  . CHF (congestive heart failure) ([place])   . COPD (chronic obstructive pulmonary disease) ([place])   . GI bleed  05/2016  . Hyperlipidemia   . Hypertension   . Nonischemic cardiomyopathy ([place])    a. stress-induced cardiomyopathy by cath 08/05/2008 (EF % at that time) - normal cors 2008-08-05 & August 06, 2010. b. 2011/08/06: echo showing EF of 50-55%. c. 05/2016: NSTEMI with cath showing normal cors and periapical akniesis --> consistent with Takotsubo Cardiomyopathy. EF down to 25-30%.  . Obesity   . Takotsubo cardiomyopathy   . Thyroid disease   . Tobacco abuse     Past Surgical History:  Procedure Laterality Date  . CARDIAC CATHETERIZATION  08/05/08   no signficant CAD. EF 30%, apical ballooning, takotsubo cardiomyopathy  . CARDIAC CATHETERIZATION N/A 05/18/2016   Procedure: Left Heart Cath and Coronary Angiography;  Surgeon: Sherren Mocha, MD;  Location: [facility];  Service: Cardiovascular;  Laterality: N/A;  . TRANSTHORACIC ECHOCARDIOGRAM  05/2011   EF 50-55%    FAMHx:  Family History  Problem Relation Age of Onset  . Lung cancer Mother     died in 08/06/03  . COPD Father   . Heart disease Brother     CABG at age 11  . Breast cancer Maternal Grandmother     SOCHx:   reports that she quit smoking about 13 years ago. She has never used smokeless tobacco. She reports that she does not drink alcohol or use drugs.  ALLERGIES:  No Known Allergies  ROS: Pertinent items noted in HPI and remainder of comprehensive ROS otherwise negative.  HOME MEDS: Current Outpatient Prescriptions  Medication Sig Dispense Refill  . albuterol (PROVENTIL HFA;VENTOLIN HFA) 108 (90 Base) MCG/ACT inhaler Inhale 2 puffs into the lungs every 4 (four) hours as needed for wheezing or shortness of breath.  1 Inhaler 5  . aspirin EC 81 MG tablet Take 1 tablet (81 mg total) by mouth daily. 30 tablet 11  . budesonide-formoterol (SYMBICORT) 160-4.5 MCG/ACT inhaler Inhale 2 puffs into the lungs 2 (two) times daily. Via patient assistance, patient has paperwork 3 Inhaler 3  . carvedilol (COREG) 3.125 MG tablet Take 1 tablet (3.125 mg total) by  mouth 2 (two) times daily with a meal.    . lisinopril (PRINIVIL,ZESTRIL) 2.5 MG tablet Take 1 tablet (2.5 mg total) by mouth daily. 30 tablet 3  . ranitidine (ZANTAC) 150 MG tablet Take 150 mg by mouth daily as needed for heartburn.    . simvastatin (ZOCOR) 20 MG tablet Take 1 tablet by mouth at bedtime.  11   No current facility-administered medications for this visit.     LABS/IMAGING: No results found for this or any previous visit (from the past 48 hour(s)). No results found.  VITALS: BP 130/70   Pulse 73   Ht 5\' 7"  (1.702 m)   Wt 193 lb 9.6 oz (87.8 kg)   SpO2 98%   BMI 30.32 kg/m   EXAM: General appearance: alert and no distress Neck: no adenopathy, no carotid bruit, no JVD, supple, symmetrical, trachea midline and thyroid not enlarged, symmetric, no tenderness/mass/nodules Lungs: clear to auscultation bilaterally Heart: regular rate and rhythm, S1, S2 normal, no murmur, click, rub or gallop Abdomen: soft, non-tender; bowel sounds normal; no masses,  no organomegaly Extremities: extremities normal, atraumatic, no cyanosis or edema Pulses: 2+ and symmetric Skin: Skin color, texture, turgor normal. No rashes or lesions Neurologic: Grossly normal  EKG: Deferred  ASSESSMENT: 1. History of recurrent stress-induced cardiomyopathy with EF around 30% 2. Labile blood pressure 3. Dyslipidemia 4. COPD  PLAN: 1.   [name] returns again with recurrent stress-induced cardiomyopathy. She's been restarted on beta blocker namely carvedilol 3.125 mg twice a day. She's also on low-dose lisinopril 2.5 mg daily 2. She said her blood pressures normally around 17-356 systolic and today was 701/41, but she was nervous about increasing the dose of her medications. Her weight is asked to gone back up about 7 pounds since she was in the hospital, but she says she lost a lot of weight prior to that and she does not feel that the weight gain is related to heart failure. Her PCP actually  recommended she go onto some low-dose Lasix which I agree with and she said she had some 10 mg Lasix tablets at home. I said she could use those as needed.  Plan follow-up with me in 3 months with a repeat echo just prior to that appointment.  Pixie Casino, MD, [facility] Attending Cardiologist [place] 07/26/2016, 1:35 PM

## 2016-08-23 MED FILL — LISINOPRIL 2.5 MG TABLET: 2.5 | 30 days supply | Qty: 30 | Fill #0

## 2016-08-23 MED FILL — SIMVASTATIN 20 MG TABLET: 20 | 30 days supply | Qty: 30 | Fill #2

## 2016-08-23 MED FILL — CARVEDILOL 3.125 MG TABLET: 3.125 | 30 days supply | Qty: 60 | Fill #0

## 2016-08-25 ENCOUNTER — Ambulatory Visit: Payer: Self-pay | Attending: Family Medicine

## 2016-09-08 ENCOUNTER — Other Ambulatory Visit (HOSPITAL_COMMUNITY): Payer: Self-pay | Admitting: *Deleted

## 2016-09-08 DIAGNOSIS — N644 Mastodynia: Secondary | ICD-10-CM

## 2016-09-15 ENCOUNTER — Encounter: Payer: Self-pay | Admitting: Family Medicine

## 2016-09-16 ENCOUNTER — Encounter (HOSPITAL_COMMUNITY): Payer: Self-pay

## 2016-09-16 ENCOUNTER — Ambulatory Visit (HOSPITAL_COMMUNITY)
Admission: RE | Admit: 2016-09-16 | Discharge: 2016-09-16 | Disposition: A | Discharge status: Home / Self Care | Payer: Self-pay | Source: Ambulatory Visit | Attending: Obstetrics and Gynecology | Admitting: Obstetrics and Gynecology

## 2016-09-16 ENCOUNTER — Ambulatory Visit
Admission: RE | Admit: 2016-09-16 | Discharge: 2016-09-16 | Disposition: A | Discharge status: Home / Self Care | Payer: No Typology Code available for payment source | Source: Ambulatory Visit | Attending: Obstetrics and Gynecology | Admitting: Obstetrics and Gynecology

## 2016-09-16 ENCOUNTER — Other Ambulatory Visit (HOSPITAL_COMMUNITY): Payer: Self-pay | Admitting: Obstetrics and Gynecology

## 2016-09-16 VITALS — BP 136/80 | Temp 98.1°F | Ht 66.0 in | Wt 197.8 lb

## 2016-09-16 DIAGNOSIS — Z029 Encounter for administrative examinations, unspecified: Secondary | ICD-10-CM | POA: Insufficient documentation

## 2016-09-16 DIAGNOSIS — N6320 Unspecified lump in the left breast, unspecified quadrant: Secondary | ICD-10-CM

## 2016-09-16 DIAGNOSIS — Z1239 Encounter for other screening for malignant neoplasm of breast: Secondary | ICD-10-CM

## 2016-09-16 DIAGNOSIS — N644 Mastodynia: Secondary | ICD-10-CM

## 2016-09-16 NOTE — Patient Instructions (Signed)
Explained breast self awareness with Areta Haber. Patient did not need a Pap smear today due to last Pap smear and HPV typing was 03/15/2016. Let her know [facility] will cover Pap smears and HPV typing every 5 years unless has a history of abnormal Pap smears. Referred patient to the [facility] for diagnostic mammogram and possible bilateral breast ultrasounds. Appointment scheduled for Thursday, Sep 16, 2016 at 1050. NAYRA COURY verbalized understanding.  Eyob Godlewski, Arvil Chaco, RN 10:29 AM

## 2016-09-16 NOTE — Progress Notes (Addendum)
Complaints of bilateral breast pain x 6 months that comes and goes. Patient rates the pain at a 2 out of 10 stating the pain is worse within her right breast.  Pap Smear: Pap smear not completed today. Last Pap smear was 03/15/2016 at [facility] and normal with negative HPV. Per patient has no history of an abnormal Pap smear. Last Pap smear result is in EPIC.  Physical exam: Breasts Breasts symmetrical. No skin abnormalities bilateral breasts. No nipple retraction bilateral breasts. No nipple discharge bilateral breasts. No lymphadenopathy. No lumps palpated bilateral breasts. Complaints of right outer breast pain and left breast pain above areola at 12 o'clock. Referred patient to the [facility] for diagnostic mammogram and possible bilateral breast ultrasounds. Appointment scheduled for Thursday, Sep 16, 2016 at 1050.        Pelvic/Bimanual No Pap smear completed today since last Pap smear and HPV typing was 03/15/2016. Pap smear not indicated per [facility] guidelines.   Smoking History: Patient has never smoked.  Patient Navigation: Patient education provided. Access to services provided for patient through [employer] program.   Colorectal Cancer Screening: Per patient has never had a colonoscopy completed. No complaints today. FIT Test given to patient to complete and return to [facility].

## 2016-09-16 NOTE — Addendum Note (Signed)
Encounter addended by: Loletta Parish, RN on: 09/16/2016 11:22 AM<BR>    Actions taken: Sign clinical note

## 2016-09-17 ENCOUNTER — Encounter (HOSPITAL_COMMUNITY): Payer: Self-pay | Admitting: *Deleted

## 2016-09-21 ENCOUNTER — Ambulatory Visit
Admission: RE | Admit: 2016-09-21 | Discharge: 2016-09-21 | Disposition: A | Discharge status: Home / Self Care | Payer: No Typology Code available for payment source | Source: Ambulatory Visit | Attending: Obstetrics and Gynecology | Admitting: Obstetrics and Gynecology

## 2016-09-21 ENCOUNTER — Other Ambulatory Visit: Payer: Self-pay | Admitting: Diagnostic Radiology

## 2016-09-21 ENCOUNTER — Other Ambulatory Visit (HOSPITAL_COMMUNITY): Payer: Self-pay | Admitting: Obstetrics and Gynecology

## 2016-09-21 DIAGNOSIS — N6320 Unspecified lump in the left breast, unspecified quadrant: Secondary | ICD-10-CM

## 2016-09-22 ENCOUNTER — Other Ambulatory Visit: Payer: Self-pay | Admitting: Student

## 2016-09-22 MED ORDER — CARVEDILOL 3.125 MG PO TABS: 3.1250 mg | ORAL_TABLET | Freq: Two times a day (BID) | ORAL | 3 refills | Status: DC

## 2016-09-22 MED FILL — SIMVASTATIN 20 MG TABLET: 20 | 30 days supply | Qty: 30 | Fill #3

## 2016-09-22 MED FILL — LISINOPRIL 2.5 MG TABLET: 2.5 | 30 days supply | Qty: 30 | Fill #1

## 2016-09-24 ENCOUNTER — Ambulatory Visit: Payer: Self-pay | Attending: Family Medicine

## 2016-09-24 MED ORDER — CARVEDILOL 3.125 MG PO TABS: 3.1250 mg | ORAL_TABLET | Freq: Two times a day (BID) | ORAL | 3 refills | Status: DC

## 2016-09-24 NOTE — Addendum Note (Signed)
Addended by: Cristopher Estimable on: 09/24/2016 02:46 PM   Modules accepted: Orders

## 2016-09-24 NOTE — Telephone Encounter (Signed)
Refill sent to the pharmacy electronically.  

## 2016-10-26 ENCOUNTER — Other Ambulatory Visit (HOSPITAL_COMMUNITY): Payer: No Typology Code available for payment source

## 2016-10-29 ENCOUNTER — Ambulatory Visit: Payer: No Typology Code available for payment source | Admitting: Internal Medicine

## 2016-11-11 ENCOUNTER — Telehealth (HOSPITAL_COMMUNITY): Payer: Self-pay | Admitting: *Deleted

## 2016-11-11 ENCOUNTER — Encounter (HOSPITAL_COMMUNITY): Payer: Self-pay | Admitting: *Deleted

## 2016-11-11 NOTE — Telephone Encounter (Signed)
Telephoned patient at home number and unable to leave message. Phone number is disconnected.

## 2016-11-22 ENCOUNTER — Other Ambulatory Visit (HOSPITAL_COMMUNITY): Payer: Self-pay

## 2016-11-26 ENCOUNTER — Other Ambulatory Visit: Payer: Self-pay

## 2016-11-26 ENCOUNTER — Ambulatory Visit (HOSPITAL_COMMUNITY): Payer: Self-pay | Attending: Cardiology

## 2016-11-26 DIAGNOSIS — Z87891 Personal history of nicotine dependence: Secondary | ICD-10-CM | POA: Insufficient documentation

## 2016-11-26 DIAGNOSIS — I428 Other cardiomyopathies: Secondary | ICD-10-CM | POA: Insufficient documentation

## 2016-11-26 DIAGNOSIS — E785 Hyperlipidemia, unspecified: Secondary | ICD-10-CM | POA: Insufficient documentation

## 2016-11-26 DIAGNOSIS — I252 Old myocardial infarction: Secondary | ICD-10-CM | POA: Insufficient documentation

## 2016-11-26 DIAGNOSIS — I5181 Takotsubo syndrome: Secondary | ICD-10-CM

## 2016-11-26 DIAGNOSIS — I119 Hypertensive heart disease without heart failure: Secondary | ICD-10-CM | POA: Insufficient documentation

## 2016-11-26 DIAGNOSIS — J449 Chronic obstructive pulmonary disease, unspecified: Secondary | ICD-10-CM | POA: Insufficient documentation

## 2016-11-26 LAB — ECHOCARDIOGRAM COMPLETE
Ao-asc: 35 cm
E/e' ratio: 7.07
EWDT: 180 ms
FS: 24 % — AB (ref 28–44)
IVS/LV PW RATIO, ED: 0.96
LA diam index: 1.96 cm/m2
LA vol index: 22.2 mL/m2
LA vol: 44.1 mL
LASIZE: 39 mm
LAVOLA4C: 48.5 mL
LEFT ATRIUM END SYS DIAM: 39 mm
LV E/e' medial: 7.07
LV E/e'average: 7.07
LV TDI E'LATERAL: 11.5
LV TDI E'MEDIAL: 8.05
LVELAT: 11.5 cm/s
LVOT VTI: 25.7 cm
LVOT area: 3.14 cm2
LVOT diameter: 20 mm
LVOTPV: 96.4 cm/s
LVOTSV: 81 mL
Lateral S' vel: 9.46 cm/s
MV Dec: 180
MV Peak grad: 3 mmHg
MV pk A vel: 74.1 m/s
MV pk E vel: 81.3 m/s
PW: 8.48 mm — AB (ref 0.6–1.1)
RV TAPSE: 25.8 mm

## 2016-11-29 ENCOUNTER — Encounter: Payer: Self-pay | Admitting: *Deleted

## 2016-12-08 ENCOUNTER — Telehealth (HOSPITAL_COMMUNITY): Payer: Self-pay | Admitting: *Deleted

## 2016-12-08 NOTE — Telephone Encounter (Signed)
Called patient to discuss that based on her breast biopsy result she is eligible for [facility] Medicaid to cover her follow up. Scheduled appointment with patient for next Friday, December 17, 2016 at 0900 to complete her Medicaid paperwork. Told patient to call if she can't make appointment or if needs to reschedule. Patient verbalized understanding.

## 2016-12-09 ENCOUNTER — Ambulatory Visit (INDEPENDENT_AMBULATORY_CARE_PROVIDER_SITE_OTHER): Payer: Medicaid Other | Admitting: Internal Medicine

## 2016-12-09 ENCOUNTER — Encounter: Payer: Self-pay | Admitting: Internal Medicine

## 2016-12-09 VITALS — BP 132/68 | HR 68 | Ht 67.0 in | Wt 202.0 lb

## 2016-12-09 DIAGNOSIS — I428 Other cardiomyopathies: Secondary | ICD-10-CM | POA: Diagnosis not present

## 2016-12-09 DIAGNOSIS — I10 Essential (primary) hypertension: Secondary | ICD-10-CM

## 2016-12-09 DIAGNOSIS — I5181 Takotsubo syndrome: Secondary | ICD-10-CM | POA: Diagnosis not present

## 2016-12-09 MED ORDER — LISINOPRIL 5 MG PO TABS: 5.0000 mg | ORAL_TABLET | Freq: Every day | ORAL | 3 refills | Status: DC

## 2016-12-09 MED ORDER — SIMVASTATIN 20 MG PO TABS: 20.0000 mg | ORAL_TABLET | Freq: Every day | ORAL | 3 refills | Status: DC

## 2016-12-09 MED ORDER — CARVEDILOL 3.125 MG PO TABS: 3.1250 mg | ORAL_TABLET | Freq: Two times a day (BID) | ORAL | 3 refills | Status: DC

## 2016-12-09 NOTE — Patient Instructions (Signed)
Medication Instructions: Your physician recommends that you continue on your current medications as directed. Please refer to the Current Medication list given to you today.  Increase Lisinopril to 5 mg daily.   Follow-Up: Your physician wants you to follow-up in: 1 year with Dr. Debara Pickett. You will receive a reminder letter in the mail two months in advance. If you don't receive a letter, please call our office to schedule the follow-up appointment.  If you need a refill on your cardiac medications before your next appointment, please call your pharmacy.

## 2016-12-09 NOTE — Progress Notes (Signed)
OFFICE NOTE  Chief Complaint:  Follow-up echo  Primary Care Physician: Boykin Nearing, MD  HPI:  [name] is a 61 year old female who was previously followed by Dr. Marlou Porch with [facility] with a history of nonischemic cardiomyopathy which was diagnosed as a stress-induced cardiomyopathy by catheterization in 16-Aug-2008. EF was 30% at that time with apical ballooning and relative basilar hypercontractility consistent with a takotsubo cardiomyopathy. Her EF improved very quickly after that, after starting on appropriate medical therapy. She has been slowly having her blood pressure medicines up titrated, including carvedilol and lisinopril. She has had no significant palpitations, chest pain, or other symptoms since about this time last year when she was admitted for a second episode of heart failure also thought to be due to a stress-induced cardiomyopathy. She did have a history of alcohol use in the past, drinking about 3 or 4 beers per day, which has stopped completely, and there does not seem to be any precipitating factors for this cardiomyopathy. Recently, she was apparently diagnosed with COPD and has been taking Symbicort which helps her symptoms.  07/26/2016  [name] returns today for follow-up. I did not see nurse's 08/16/2012. She has a history of stress-induced cardio myopathy with EF as low as 30% in the past however improved up to 50%. She was recently admitted in the hospital with another acute distress episode and non-ST elevation MI with troponin greater than 4. Cardiac catheterization was performed on 05/18/2016 which demonstrated normal coronary arteries, severe LV dysfunction with EF 30-35% and apical akinesis, consistent with recurrent stress-induced cardiomyopathy. This is been compensated by hypotension.  12/09/2016  [name] is seen today in follow-up she recently had a repeat echo which shows improvement in her LVEF back to 55-60%. This is most consistent with  stress-induced cardiomyopathy. We also restarted her carvedilol 3.125 mg twice a day. She is on low-dose lisinopril and there is room and blood pressure for [place] to up titrate that today is her blood pressure is 132/68. EKG shows sinus rhythm.  PMHx:  Past Medical History:  Diagnosis Date  . CHF (congestive heart failure) ([place])   . COPD (chronic obstructive pulmonary disease) ([place])   . GI bleed 05/2016  . Hyperlipidemia   . Hypertension   . Nonischemic cardiomyopathy ([place])    a. stress-induced cardiomyopathy by cath 08/16/2008 (EF % at that time) - normal cors 08/16/2008 & August 17, 2010. b. 17-Aug-2011: echo showing EF of 50-55%. c. 05/2016: NSTEMI with cath showing normal cors and periapical akniesis --> consistent with Takotsubo Cardiomyopathy. EF down to 25-30%.  . Obesity   . Takotsubo cardiomyopathy   . Thyroid disease   . Tobacco abuse     Past Surgical History:  Procedure Laterality Date  . CARDIAC CATHETERIZATION  2008-08-16   no signficant CAD. EF 30%, apical ballooning, takotsubo cardiomyopathy  . CARDIAC CATHETERIZATION N/A 05/18/2016   Procedure: Left Heart Cath and Coronary Angiography;  Surgeon: Sherren Mocha, MD;  Location: [facility];  Service: Cardiovascular;  Laterality: N/A;  . TRANSTHORACIC ECHOCARDIOGRAM  05/2011   EF 50-55%    FAMHx:  Family History  Problem Relation Age of Onset  . Lung cancer Mother        died in 2003/08/17  . COPD Father   . Heart disease Brother        CABG at age 44  . Breast cancer Maternal Grandmother     SOCHx:   reports that she quit smoking about 13 years ago. She  has never used smokeless tobacco. She reports that she does not drink alcohol or use drugs.  ALLERGIES:  No Known Allergies  ROS: Pertinent items noted in HPI and remainder of comprehensive ROS otherwise negative.  HOME MEDS: Current Outpatient Prescriptions  Medication Sig Dispense Refill  . albuterol (PROVENTIL HFA;VENTOLIN HFA) 108 (90 Base) MCG/ACT inhaler Inhale 2 puffs into the lungs  every 4 (four) hours as needed for wheezing or shortness of breath. 1 Inhaler 5  . aspirin EC 81 MG tablet Take 1 tablet (81 mg total) by mouth daily. 30 tablet 11  . budesonide-formoterol (SYMBICORT) 160-4.5 MCG/ACT inhaler Inhale 2 puffs into the lungs 2 (two) times daily. Via patient assistance, patient has paperwork 3 Inhaler 3  . carvedilol (COREG) 3.125 MG tablet Take 1 tablet (3.125 mg total) by mouth 2 (two) times daily with a meal. 180 tablet 3  . ranitidine (ZANTAC) 150 MG tablet Take 150 mg by mouth daily as needed for heartburn.    . simvastatin (ZOCOR) 20 MG tablet Take 1 tablet (20 mg total) by mouth at bedtime. 90 tablet 3  . lisinopril (PRINIVIL,ZESTRIL) 5 MG tablet Take 1 tablet (5 mg total) by mouth daily. 90 tablet 3   No current facility-administered medications for this visit.     LABS/IMAGING: No results found for this or any previous visit (from the past 48 hour(s)). No results found.  VITALS: BP 132/68   Pulse 68   Ht 5\' 7"  (1.702 m)   Wt 202 lb (91.6 kg)   BMI 31.64 kg/m   EXAM: General appearance: alert and no distress Neck: no adenopathy, no carotid bruit, no JVD, supple, symmetrical, trachea midline and thyroid not enlarged, symmetric, no tenderness/mass/nodules Lungs: clear to auscultation bilaterally Heart: regular rate and rhythm, S1, S2 normal, no murmur, click, rub or gallop Abdomen: soft, non-tender; bowel sounds normal; no masses,  no organomegaly Extremities: extremities normal, atraumatic, no cyanosis or edema Pulses: 2+ and symmetric Skin: Skin color, texture, turgor normal. No rashes or lesions Neurologic: Grossly normal  EKG: Normal sinus rhythm at 68-personally reviewed  ASSESSMENT: 1. History of recurrent stress-induced cardiomyopathy with EF around 30% - improved to 55-60% (10/2016) 2. Labile blood pressure 3. Dyslipidemia 4. COPD  PLAN: 1.   [name] again has had improvement in her LVEF up to 55-60%. She understands she'll  need to remain on her heart failure medicines indefinitely. Blood pressure is much better control. We'll continue her current medicines and plan to see her back annually or sooner as necessary.  Pixie Casino, MD, [facility] Attending Cardiologist [place] 12/09/2016, 5:21 PM

## 2016-12-27 ENCOUNTER — Telehealth (HOSPITAL_COMMUNITY): Payer: Self-pay

## 2016-12-27 NOTE — Telephone Encounter (Signed)
Spoke with patient to remind her about the at home FIT Test that was given to her in [place] on 09/16/16. Patient stated that she misplaced it when she moved and asked if I could send her a new one. I let her know that I would send a new one in the mail.

## 2016-12-29 ENCOUNTER — Telehealth (HOSPITAL_COMMUNITY): Payer: Self-pay | Admitting: *Deleted

## 2016-12-29 NOTE — Telephone Encounter (Signed)
Called patient and reminded her to reschedule her appointment with [facility]. Patient stated she will call and reschedule and verbalized understanding.

## 2017-02-28 ENCOUNTER — Telehealth: Payer: Self-pay | Admitting: *Deleted

## 2017-02-28 NOTE — Telephone Encounter (Signed)
PATIENT OF [facility] Pre-operative Risk Assessment    Request for surgical clearance:  What type of surgery is being performed? LEFT BREAST LUMPECTOMY  1. When is this surgery scheduled?TBA  2. Are there any medications that need to be held prior to surgery and how long?  3. Practice name and name of physician performing surgery? [facility] ---DR DAVID NEWMAN  4. What is your office phone and fax number? PHONE [place] [phone]  5. Anesthesia type (None, local, MAC, general) ? GENERAL   Raiford Simmonds 02/28/2017, 5:57 PM  _________________________________________________________________   (provider comments below)

## 2017-03-01 NOTE — Telephone Encounter (Signed)
    Chart reviewed as part of pre-operative protocol coverage. Patient was contacted 03/01/2017 in reference to pre-operative risk assessment for pending surgery as outlined below.  [name] was last seen on 12/09/16 by Dr. Debara Pickett.  Since that day, [name] has done well. No new symptoms. Stable SOB due to COPD. Limited ambulation. Recent echo 10/2016 showed improving LVEF.   Will review with Dr. Debara Pickett reading clearance and aspirin.    [place], PA 03/01/2017, 3:45 PM

## 2017-03-01 NOTE — Telephone Encounter (Signed)
Ok to clear for lumpectomy. Hold aspirin 7 days prior if necessary.  Dr. Debara Pickett

## 2017-03-02 NOTE — Telephone Encounter (Signed)
Sent to Dr Lucia Gaskins via [facility]

## 2017-03-03 ENCOUNTER — Ambulatory Visit: Payer: Self-pay | Admitting: Surgery

## 2017-03-03 DIAGNOSIS — N6092 Unspecified benign mammary dysplasia of left breast: Secondary | ICD-10-CM

## 2017-03-07 ENCOUNTER — Other Ambulatory Visit: Payer: Self-pay | Admitting: Surgery

## 2017-03-07 DIAGNOSIS — N6092 Unspecified benign mammary dysplasia of left breast: Secondary | ICD-10-CM

## 2017-04-05 ENCOUNTER — Other Ambulatory Visit (HOSPITAL_COMMUNITY): Payer: Self-pay | Admitting: *Deleted

## 2017-04-05 NOTE — Pre-Procedure Instructions (Addendum)
[name]  04/05/2017      [facility] [address], [place] [address] Phone: [phone] Fax: [phone]    Your procedure is scheduled on 04-08-2017  Friday   Report to [facility] Admitting at 9:30A.M.   Call this number if you have problems the morning of surgery:  [phone]   Remember:  Do not eat food or drink liquids after midnight.   Take these medicines the morning of surgery with A SIP OF WATER albuterol inhaler if needed,symbicort inhaler,carvedilol(Coreg),  STOP ASPIRIN,ANTIINFLAMATORIES (IBUPROFEN,ALEVE,MOTRIN,ADVIL,GOODY'S POWDERS),HERBAL SUPPLEMENTS,FISH OIL,AND VITAMINS 5-7 DAYS PRIOR TO SURGERY    DRINK BOTTLE OF ENSURE PRE-SURGICAL BEFORE LEAVING FOR SURGERY   Do not wear jewelry, make-up or nail polish.  Do not wear lotions, powders, or perfumes, or deoderant.  Do not shave 48 hours prior to surgery.  Men may shave face and neck.  Do not bring valuables to the hospital.  [facility] is not responsible for any belongings or valuables.  Contacts, dentures or bridgework may not be worn into surgery.  Leave your suitcase in the car.  After surgery it may be brought to your room.  For patients admitted to the hospital, discharge time will be determined by your treatment team.  Patients discharged the day of surgery will not be allowed to drive home.    Special Instructions: [place] - Preparing for Surgery  Before surgery, you can play an important role.  Because skin is not sterile, your skin needs to be as free of germs as possible.  You can reduce the number of germs on you skin by washing with CHG (chlorahexidine gluconate) soap before surgery.  CHG is an antiseptic cleaner which kills germs and bonds with the skin to continue killing germs even after washing.  Please DO NOT use if you have an allergy to CHG or antibacterial soaps.  If your skin becomes  reddened/irritated stop using the CHG and inform your nurse when you arrive at Short Stay.  Do not shave (including legs and underarms) for at least 48 hours prior to the first CHG shower.  You may shave your face.  Please follow these instructions carefully:   1.  Shower with CHG Soap the night before surgery and the   morning of Surgery.  2.  If you choose to wash your hair, wash your hair first as usual with your normal shampoo.  3.  After you shampoo, rinse your hair and body thoroughly to remove the  Shampoo.  4.  Use CHG as you would any other liquid soap.  You can apply chg directly  to the skin and wash gently with scrungie or a clean washcloth.  5.  Apply the CHG Soap to your body ONLY FROM THE NECK DOWN.   Do not use on open wounds or open sores.  Avoid contact with your eyes,  ears, mouth and genitals (private parts).  Wash genitals (private parts) with your normal soap.  6.  Wash thoroughly, paying special attention to the area where your surgery will be performed.  7.  Thoroughly rinse your body with warm water from the neck down.  8.  DO NOT shower/wash with your normal soap after using and rinsing o  the CHG Soap.  9.  Pat yourself dry with a clean towel.            10.  Wear clean pajamas.  11.  Place clean sheets on your bed the night of your first shower and do not sleep with pets.  Day of Surgery  Do not apply any lotions/deodorants the morning of surgery.  Please wear clean clothes to the hospital/surgery center.    Please read over the following fact sheets that you were given. Coughing and Deep Breathing and Surgical Site Infection Prevention

## 2017-04-06 ENCOUNTER — Encounter (HOSPITAL_COMMUNITY)
Admission: RE | Admit: 2017-04-06 | Discharge: 2017-04-06 | Disposition: A | Discharge status: Home / Self Care | Payer: Medicaid Other | Source: Ambulatory Visit | Attending: Surgery | Admitting: Surgery

## 2017-04-06 ENCOUNTER — Encounter (HOSPITAL_COMMUNITY): Payer: Self-pay

## 2017-04-06 ENCOUNTER — Other Ambulatory Visit: Payer: Self-pay

## 2017-04-06 DIAGNOSIS — Z01812 Encounter for preprocedural laboratory examination: Secondary | ICD-10-CM | POA: Diagnosis not present

## 2017-04-06 DIAGNOSIS — E785 Hyperlipidemia, unspecified: Secondary | ICD-10-CM | POA: Diagnosis not present

## 2017-04-06 DIAGNOSIS — I110 Hypertensive heart disease with heart failure: Secondary | ICD-10-CM | POA: Insufficient documentation

## 2017-04-06 DIAGNOSIS — I252 Old myocardial infarction: Secondary | ICD-10-CM | POA: Insufficient documentation

## 2017-04-06 DIAGNOSIS — Z01818 Encounter for other preprocedural examination: Secondary | ICD-10-CM | POA: Insufficient documentation

## 2017-04-06 DIAGNOSIS — Z87891 Personal history of nicotine dependence: Secondary | ICD-10-CM | POA: Diagnosis not present

## 2017-04-06 DIAGNOSIS — Z79899 Other long term (current) drug therapy: Secondary | ICD-10-CM | POA: Diagnosis not present

## 2017-04-06 DIAGNOSIS — J449 Chronic obstructive pulmonary disease, unspecified: Secondary | ICD-10-CM | POA: Insufficient documentation

## 2017-04-06 DIAGNOSIS — K219 Gastro-esophageal reflux disease without esophagitis: Secondary | ICD-10-CM | POA: Insufficient documentation

## 2017-04-06 DIAGNOSIS — I509 Heart failure, unspecified: Secondary | ICD-10-CM | POA: Diagnosis not present

## 2017-04-06 DIAGNOSIS — Z7951 Long term (current) use of inhaled steroids: Secondary | ICD-10-CM | POA: Diagnosis not present

## 2017-04-06 HISTORY — DX: Gastro-esophageal reflux disease without esophagitis: K21.9

## 2017-04-06 HISTORY — DX: Anxiety disorder, unspecified: F41.9

## 2017-04-06 HISTORY — DX: Anemia, unspecified: D64.9

## 2017-04-06 HISTORY — DX: Acute myocardial infarction, unspecified: I21.9

## 2017-04-06 HISTORY — DX: Dyspnea, unspecified: R06.00

## 2017-04-06 HISTORY — DX: Other specified postprocedural states: Z98.890

## 2017-04-06 HISTORY — DX: Nausea with vomiting, unspecified: R11.2

## 2017-04-06 HISTORY — DX: Cardiac arrhythmia, unspecified: I49.9

## 2017-04-06 LAB — BASIC METABOLIC PANEL
Anion gap: 9 (ref 5–15)
BUN: 10 mg/dL (ref 6–20)
CALCIUM: 9.5 mg/dL (ref 8.9–10.3)
CO2: 25 mmol/L (ref 22–32)
CREATININE: 1.12 mg/dL — AB (ref 0.44–1.00)
Chloride: 106 mmol/L (ref 101–111)
GFR calc non Af Amer: 52 mL/min — ABNORMAL LOW (ref >60)
Glucose, Bld: 97 mg/dL (ref 65–99)
Potassium: 4.3 mmol/L (ref 3.5–5.1)
SODIUM: 140 mmol/L (ref 135–145)

## 2017-04-06 LAB — CBC
HCT: 39.7 % (ref 36.0–46.0)
Hemoglobin: 12.9 g/dL (ref 12.0–15.0)
MCH: 29.7 pg (ref 26.0–34.0)
MCHC: 32.5 g/dL (ref 30.0–36.0)
MCV: 91.5 fL (ref 78.0–100.0)
PLATELETS: 211 10*3/uL (ref 150–400)
RBC: 4.34 MIL/uL (ref 3.87–5.11)
RDW: 13.4 % (ref 11.5–15.5)
WBC: 4.9 10*3/uL (ref 4.0–10.5)

## 2017-04-06 NOTE — Progress Notes (Addendum)
PCP is Karle Plumber Cardiologist is Dr Debara Pickett- see note 03-01-17 clearance she has Takotsubo cardiomyopathy Echo 11-26-16 Cath 05-18-16 Denies a fever, cough, or chest pain. Denies ever having a stress test.  States the seed will be placed tomorrow 04-07-17 Pre-surgical Ensure given to pt with instructions to drink before she leaves her house on the day of surgery- voices understanding teach back noted.

## 2017-04-07 ENCOUNTER — Ambulatory Visit
Admission: RE | Admit: 2017-04-07 | Discharge: 2017-04-07 | Disposition: A | Discharge status: Home / Self Care | Payer: No Typology Code available for payment source | Source: Ambulatory Visit | Attending: Surgery | Admitting: Surgery

## 2017-04-07 DIAGNOSIS — N6092 Unspecified benign mammary dysplasia of left breast: Secondary | ICD-10-CM

## 2017-04-07 NOTE — Progress Notes (Signed)
Anesthesia Chart Review:  Pt is a 61 year old female scheduled for L breast lumpectomy with radioactive seed localization on 04/08/2017 with Alphonsa Overall, MD  - PCP is Karle Plumber, MD  - Cardiologist is Lyman Bishop, MD who cleared pt for surgery 03/01/17. Last office visit 12/09/16.   PMH includes:  NSTEMI (05/2016), Takotsubo cardiomyopathy, CHF, HTN, hyperlipidemia, COPD, anemia, post-op N/V, GERD. Former smoker. BMI 34  - Hospitalized 1/15-18/18 for NSTEMI.  - Hospitalized 05/02/16-05/04/16 for GI bleed.   Medications include: Albuterol, Symbicort, carvedilol, lisinopril, Zantac, Zocor  BP 118/90   Pulse 65   Temp 36.6 C   Resp 20   Ht 5\' 6"  (1.676 m)   Wt 209 lb (94.8 kg)   SpO2 99%   BMI 33.73 kg/m   Preoperative labs reviewed.    CXR 05/18/16:  - Interval marked improvement and right base opacity suggesting a component was related atelectasis. - Stable scarring left upper lobe.  EKG 12/09/16: NSR  Echo 11/26/16:  - Left ventricle: The cavity size was mildly dilated. Systolic function was normal. The estimated ejection fraction was in the range of 55% to 60%. Wall motion was normal; there were no regional wall motion abnormalities. Left ventricular diastolic function parameters were normal.  Cardiac cath 05/18/16:  1. Normal coronary arteries 2. Severe LV systolic dysfunction, LVEF estimated 30-35% with periapical akinesis, typical appearance of apical ballooning syndrome - Conclusion: Acute Takotsubo Syndrome  If no changes, I anticipate pt can proceed with surgery as scheduled.   Willeen Cass, FNP-BC [facility] Short Stay Surgical Center/Anesthesiology Phone: [phone] 04/07/2017 9:50 AM

## 2017-04-07 NOTE — H&P (Signed)
[name]  Location: [facility] Patient #: [id number] DOB: [birth year] Widowed / Language: Cleophus Molt / Race: White Female  History of Present Illness   The patient is a 61 year old female who presents with a complaint of left breast mass.  The PCP is Lisabeth Register [facility]). Dr. Adrian Blackwater has moved on.  The patient was referred by Dr. Abby Potash.  She comes by herself.  She has had tenderness in both breasts for much of her adult life. She underwent mammograms at the breast center on 16 Sep 2016 that showed an indeterminate mass in the left breast at the 12 o'clock position. She had a left breast biopsy on 09/21/2016 [phone]) which showed a fibroadenoma with ADH. She has a grandmother who had breast cancer on her mother's side. She's had no prior history of breast surgery. As discussed above, she has a lot of breast pain.  I discussed proceeding with surgery for this mass. I described the seed localization, the hospital course, and recovery. If this proves to be malignant, she may need further therapy.  Past Medical History: 1. History of CHF She says that she has had 3 MI's. Her last cath was in Jan 2018. this showed normal coronary arteries, but severe LV systolic dysfunction (Takotsubo syndrome) Stress induced cardiomyopathy Followed by Dr. Alma Friendly 2. HTN 3. COPD Quit smoking in 2004 4. Dyslipidimia 5. Never had a colonosocpy - we talked about this and its importance 6. Has back pain from a fall in her 20's, but is not seeing anyone for this.  Social History: Widowed in 2002. Has not worked since Jan 2018. Does not plan to go back to work  Lives with children: 12 yo daughter and 59 yo son    Past Surgical History Alean Rinne, [place]; 02/23/2017 9:21 AM) Breast Biopsy  Left.  Diagnostic Studies History Alean Rinne, [place]; 02/23/2017 9:21 AM) Mammogram  within last year Pap Smear  1-5 years  ago  Allergies Alean Rinne, [place]; 02/23/2017 9:23 AM) No Known Drug Allergies 02/23/2017 Allergies Reconciled   Medication History Alean Rinne, RMA; 02/23/2017 9:24 AM) Ventolin HFA (108 (90 Base)MCG/ACT Aerosol Soln, Inhalation) Active. Lisinopril (2.5MG  Tablet, Oral) Active. Simvastatin (20MG  Tablet, Oral) Active. Carvedilol (3.125MG  Tablet, Oral) Active. Zantac (150MG  Tablet, Oral) Active. Medications Reconciled  Social History Alean Rinne, [place]; 02/23/2017 9:21 AM) Alcohol use  Remotely quit alcohol use. No caffeine use  No drug use  Tobacco use  Former smoker.  Family History Alean Rinne, [place]; 02/23/2017 9:21 AM) Arthritis  Father. Cancer  Mother. Heart Disease  Brother. Prostate Cancer  Father. Respiratory Condition  Father.  Pregnancy / Birth History Alean Rinne, [place]; 02/23/2017 9:21 AM) Age at menarche  67 years. Age of menopause  <45 Gravida  2 Maternal age  62-20 Para  2  Other Problems Alean Rinne, [place]; 02/23/2017 9:21 AM) Chronic Obstructive Lung Disease  Congestive Heart Failure  Lump In Breast  Myocardial infarction  Other disease, cancer, significant illness     Review of Systems Alean Rinne RMA; 02/23/2017 9:21 AM) General Present- Fatigue. Not Present- Appetite Loss, Chills, Fever, Night Sweats, Weight Gain and Weight Loss. Skin Not Present- Change in Wart/Mole, Dryness, Hives, Jaundice, New Lesions, Non-Healing Wounds, Rash and Ulcer. HEENT Present- Wears glasses/contact lenses. Not Present- Earache, Hearing Loss, Hoarseness, Nose Bleed, Oral Ulcers, Ringing in the Ears, Seasonal Allergies, Sinus Pain, Sore Throat, Visual Disturbances and Yellow Eyes. Breast Present- Breast Pain. Not Present- Breast Mass, Nipple Discharge and Skin Changes. Cardiovascular  Present- Shortness of Breath. Not Present- Chest Pain, Difficulty Breathing Lying Down, Leg Cramps, Palpitations, Rapid Heart Rate and Swelling of  Extremities. Gastrointestinal Not Present- Abdominal Pain, Bloating, Bloody Stool, Change in Bowel Habits, Chronic diarrhea, Constipation, Difficulty Swallowing, Excessive gas, Gets full quickly at meals, Hemorrhoids, Indigestion, Nausea, Rectal Pain and Vomiting. Female Genitourinary Not Present- Frequency, Nocturia, Painful Urination, Pelvic Pain and Urgency. Musculoskeletal Not Present- Back Pain, Joint Pain, Joint Stiffness, Muscle Pain, Muscle Weakness and Swelling of Extremities. Neurological Not Present- Decreased Memory, Fainting, Headaches, Numbness, Seizures, Tingling, Tremor, Trouble walking and Weakness. Psychiatric Not Present- Anxiety, Bipolar, Change in Sleep Pattern, Depression, Fearful and Frequent crying. Endocrine Not Present- Cold Intolerance, Excessive Hunger, Hair Changes, Heat Intolerance, Hot flashes and New Diabetes. Hematology Not Present- Blood Thinners, Easy Bruising, Excessive bleeding, Gland problems, HIV and Persistent Infections.  Vitals Mardene Celeste King RMA; 02/23/2017 9:22 AM) 02/23/2017 9:22 AM Weight: 207.8 lb Height: 66in Body Surface Area: 2.03 m Body Mass Index: 33.54 kg/m  Temp.: 98.70F  Pulse: 70 (Regular)  BP: 140/80 (Sitting, Left Arm, Standard)   Physical Exam  General: Obese wF alert and generally healthy appearing. HEENT: Normal. Pupils equal.  Neck: Supple. No mass. No thyroid mass. Lymph Nodes: No supraclavicular or cervical nodes.  Lungs: Clear to auscultation and symmetric breath sounds. Heart: RRR. No murmur or rub.  Breast: Right - mildly tender. No mass or nodule.  Left - - mildly tender. No mass or nodule. I don't feel the mass seen on mammogram, but because of tenderness, I did not press hard.  Abdomen: Obese. Soft. No mass. No tenderness. No hernia. Normal bowel sounds. No abdominal scars. Rectal: Not done.  Extremities: Good strength and ROM in upper and lower extremities.  Neurologic: Grossly intact to motor and  sensory function. Psychiatric: Has normal mood and affect. Behavior is normal.   Assessment & Plan  1.  ATYPICAL DUCTAL HYPERPLASIA OF LEFT BREAST (N60.92)  Story: Left breast biopsy on 09/21/2016 [phone]) which showed a fibroadenoma with ADH.  Plan:   1)  Addendum Note(Niomie Englert H. Lucia Gaskins MD; 03/03/2017 11:02 AM) Cardiac clearance from Dr. Debara Pickett.   2) Left breast lumpectomy (seed localization)  2. History of CHF She says that she has had 3 MI's. Her last cath was in Jan 2018. this showed normal coronary arteries, but severe LV systolic dysfunction (Takotsubo syndrome) Stress induced cardiomyopathy Followed by Dr. Raliegh Ip. [place] 3. HTN 4. COPD Quit smoking in 2004 5. Dyslipidimia 6. Has back pain from a fall in her 20's, but is not seeing anyone for this.   Alphonsa Overall, MD, [facility] Pager: [phone] Office phone:  [phone]

## 2017-04-08 ENCOUNTER — Ambulatory Visit (HOSPITAL_COMMUNITY): Payer: Medicaid Other | Admitting: Emergency Medicine

## 2017-04-08 ENCOUNTER — Ambulatory Visit (HOSPITAL_COMMUNITY)
Admission: RE | Admit: 2017-04-08 | Discharge: 2017-04-08 | Disposition: A | Discharge status: Home / Self Care | Payer: Medicaid Other | Source: Ambulatory Visit | Attending: Surgery | Admitting: Surgery

## 2017-04-08 ENCOUNTER — Ambulatory Visit
Admission: RE | Admit: 2017-04-08 | Discharge: 2017-04-08 | Disposition: A | Discharge status: Home / Self Care | Payer: No Typology Code available for payment source | Source: Ambulatory Visit | Attending: Surgery | Admitting: Surgery

## 2017-04-08 ENCOUNTER — Other Ambulatory Visit: Payer: Self-pay

## 2017-04-08 ENCOUNTER — Encounter (HOSPITAL_COMMUNITY)
Admission: RE | Disposition: A | Discharge status: Home / Self Care | Payer: Self-pay | Source: Ambulatory Visit | Attending: Surgery

## 2017-04-08 ENCOUNTER — Ambulatory Visit (HOSPITAL_COMMUNITY): Payer: Medicaid Other | Admitting: Anesthesiology

## 2017-04-08 ENCOUNTER — Encounter (HOSPITAL_COMMUNITY): Payer: Self-pay | Admitting: General Practice

## 2017-04-08 DIAGNOSIS — I509 Heart failure, unspecified: Secondary | ICD-10-CM | POA: Insufficient documentation

## 2017-04-08 DIAGNOSIS — Z87891 Personal history of nicotine dependence: Secondary | ICD-10-CM | POA: Diagnosis not present

## 2017-04-08 DIAGNOSIS — I252 Old myocardial infarction: Secondary | ICD-10-CM | POA: Diagnosis not present

## 2017-04-08 DIAGNOSIS — N6092 Unspecified benign mammary dysplasia of left breast: Secondary | ICD-10-CM | POA: Insufficient documentation

## 2017-04-08 DIAGNOSIS — E785 Hyperlipidemia, unspecified: Secondary | ICD-10-CM | POA: Insufficient documentation

## 2017-04-08 DIAGNOSIS — Z803 Family history of malignant neoplasm of breast: Secondary | ICD-10-CM | POA: Insufficient documentation

## 2017-04-08 DIAGNOSIS — J449 Chronic obstructive pulmonary disease, unspecified: Secondary | ICD-10-CM | POA: Insufficient documentation

## 2017-04-08 DIAGNOSIS — D242 Benign neoplasm of left breast: Secondary | ICD-10-CM | POA: Insufficient documentation

## 2017-04-08 DIAGNOSIS — Z79899 Other long term (current) drug therapy: Secondary | ICD-10-CM | POA: Diagnosis not present

## 2017-04-08 DIAGNOSIS — I110 Hypertensive heart disease with heart failure: Secondary | ICD-10-CM | POA: Diagnosis not present

## 2017-04-08 HISTORY — PX: BREAST LUMPECTOMY WITH RADIOACTIVE SEED LOCALIZATION: SHX6424

## 2017-04-08 SURGERY — BREAST LUMPECTOMY WITH RADIOACTIVE SEED LOCALIZATION
Anesthesia: General | Site: Breast | Laterality: Left

## 2017-04-08 MED ORDER — LIDOCAINE 2% (20 MG/ML) 5 ML SYRINGE
INTRAMUSCULAR | Status: AC
  Filled 2017-04-08: qty 5

## 2017-04-08 MED ORDER — DEXAMETHASONE SODIUM PHOSPHATE 4 MG/ML IJ SOLN
INTRAMUSCULAR | Status: DC | PRN
  Administered 2017-04-08: 8 mg via INTRAVENOUS

## 2017-04-08 MED ORDER — ONDANSETRON HCL 4 MG/2ML IJ SOLN: 4.0000 mg | Freq: Once | INTRAMUSCULAR | Status: DC | PRN

## 2017-04-08 MED ORDER — HYDROCODONE-ACETAMINOPHEN 5-325 MG PO TABS: 1.0000 | ORAL_TABLET | Freq: Four times a day (QID) | ORAL | 0 refills | Status: DC | PRN

## 2017-04-08 MED ORDER — 0.9 % SODIUM CHLORIDE (POUR BTL) OPTIME
TOPICAL | Status: DC | PRN
  Administered 2017-04-08: 1000 mL

## 2017-04-08 MED ORDER — MIDAZOLAM HCL 5 MG/5ML IJ SOLN
INTRAMUSCULAR | Status: DC | PRN
  Administered 2017-04-08: 2 mg via INTRAVENOUS

## 2017-04-08 MED ORDER — ACETAMINOPHEN 500 MG PO TABS
1000.0000 mg | ORAL_TABLET | ORAL | Status: AC
  Administered 2017-04-08: 1000 mg via ORAL
  Filled 2017-04-08: qty 2

## 2017-04-08 MED ORDER — MIDAZOLAM HCL 2 MG/2ML IJ SOLN
INTRAMUSCULAR | Status: AC
  Filled 2017-04-08: qty 2

## 2017-04-08 MED ORDER — DEXAMETHASONE SODIUM PHOSPHATE 10 MG/ML IJ SOLN
INTRAMUSCULAR | Status: AC
  Filled 2017-04-08: qty 1

## 2017-04-08 MED ORDER — FENTANYL CITRATE (PF) 100 MCG/2ML IJ SOLN
INTRAMUSCULAR | Status: AC
  Filled 2017-04-08: qty 2

## 2017-04-08 MED ORDER — GABAPENTIN 300 MG PO CAPS
300.0000 mg | ORAL_CAPSULE | ORAL | Status: AC
  Administered 2017-04-08: 300 mg via ORAL
  Filled 2017-04-08: qty 1

## 2017-04-08 MED ORDER — BUPIVACAINE-EPINEPHRINE 0.25% -1:200000 IJ SOLN
INTRAMUSCULAR | Status: DC | PRN
  Administered 2017-04-08: 30 mL

## 2017-04-08 MED ORDER — SCOPOLAMINE 1 MG/3DAYS TD PT72
MEDICATED_PATCH | TRANSDERMAL | Status: DC | PRN
  Administered 2017-04-08: 1 via TRANSDERMAL

## 2017-04-08 MED ORDER — FENTANYL CITRATE (PF) 250 MCG/5ML IJ SOLN
INTRAMUSCULAR | Status: AC
  Filled 2017-04-08: qty 5

## 2017-04-08 MED ORDER — HYDROCODONE-ACETAMINOPHEN 5-325 MG PO TABS
ORAL_TABLET | ORAL | Status: AC
  Filled 2017-04-08: qty 1

## 2017-04-08 MED ORDER — MEPERIDINE HCL 25 MG/ML IJ SOLN: 6.2500 mg | INTRAMUSCULAR | Status: DC | PRN

## 2017-04-08 MED ORDER — CHLORHEXIDINE GLUCONATE CLOTH 2 % EX PADS: 6.0000 | MEDICATED_PAD | Freq: Once | CUTANEOUS | Status: DC

## 2017-04-08 MED ORDER — EPHEDRINE 5 MG/ML INJ
INTRAVENOUS | Status: AC
  Filled 2017-04-08: qty 10

## 2017-04-08 MED ORDER — LIDOCAINE 2% (20 MG/ML) 5 ML SYRINGE
INTRAMUSCULAR | Status: DC | PRN
  Administered 2017-04-08: 100 mg via INTRAVENOUS

## 2017-04-08 MED ORDER — FENTANYL CITRATE (PF) 100 MCG/2ML IJ SOLN
25.0000 ug | INTRAMUSCULAR | Status: DC | PRN
  Administered 2017-04-08 (×2): 25 ug via INTRAVENOUS

## 2017-04-08 MED ORDER — PHENYLEPHRINE HCL 10 MG/ML IJ SOLN
INTRAVENOUS | Status: DC | PRN
  Administered 2017-04-08: 60 ug/min via INTRAVENOUS

## 2017-04-08 MED ORDER — PROPOFOL 10 MG/ML IV BOLUS
INTRAVENOUS | Status: DC | PRN
  Administered 2017-04-08: 200 mg via INTRAVENOUS

## 2017-04-08 MED ORDER — FENTANYL CITRATE (PF) 100 MCG/2ML IJ SOLN: 25.0000 ug | INTRAMUSCULAR | Status: DC | PRN

## 2017-04-08 MED ORDER — EPHEDRINE SULFATE-NACL 50-0.9 MG/10ML-% IV SOSY
PREFILLED_SYRINGE | INTRAVENOUS | Status: DC | PRN
Start: 2017-04-08 — End: 2017-04-08
  Administered 2017-04-08: 5 mg via INTRAVENOUS
  Administered 2017-04-08 (×2): 10 mg via INTRAVENOUS

## 2017-04-08 MED ORDER — BUPIVACAINE-EPINEPHRINE (PF) 0.25% -1:200000 IJ SOLN
INTRAMUSCULAR | Status: AC
  Filled 2017-04-08: qty 30

## 2017-04-08 MED ORDER — ONDANSETRON HCL 4 MG/2ML IJ SOLN
INTRAMUSCULAR | Status: AC
  Filled 2017-04-08: qty 2

## 2017-04-08 MED ORDER — LACTATED RINGERS IV SOLN
INTRAVENOUS | Status: DC | PRN
  Administered 2017-04-08: 09:00:00 via INTRAVENOUS

## 2017-04-08 MED ORDER — PROPOFOL 10 MG/ML IV BOLUS
INTRAVENOUS | Status: AC
  Filled 2017-04-08: qty 20

## 2017-04-08 MED ORDER — ONDANSETRON HCL 4 MG/2ML IJ SOLN
INTRAMUSCULAR | Status: DC | PRN
  Administered 2017-04-08: 4 mg via INTRAVENOUS

## 2017-04-08 MED ORDER — HYDROCODONE-ACETAMINOPHEN 5-325 MG PO TABS
1.0000 | ORAL_TABLET | Freq: Once | ORAL | Status: AC
  Administered 2017-04-08: 1 via ORAL

## 2017-04-08 MED ORDER — LACTATED RINGERS IV SOLN
INTRAVENOUS | Status: DC
  Administered 2017-04-08: 09:00:00 via INTRAVENOUS

## 2017-04-08 MED ORDER — PHENYLEPHRINE 40 MCG/ML (10ML) SYRINGE FOR IV PUSH (FOR BLOOD PRESSURE SUPPORT)
PREFILLED_SYRINGE | INTRAVENOUS | Status: AC
  Filled 2017-04-08: qty 10

## 2017-04-08 MED ORDER — SCOPOLAMINE 1 MG/3DAYS TD PT72
MEDICATED_PATCH | TRANSDERMAL | Status: AC
  Filled 2017-04-08: qty 1

## 2017-04-08 MED ORDER — CEFAZOLIN SODIUM-DEXTROSE 2-4 GM/100ML-% IV SOLN
2.0000 g | INTRAVENOUS | Status: AC
  Administered 2017-04-08: 2 g via INTRAVENOUS
  Filled 2017-04-08: qty 100

## 2017-04-08 MED ORDER — GLYCOPYRROLATE 0.2 MG/ML IV SOSY
PREFILLED_SYRINGE | INTRAVENOUS | Status: DC | PRN
  Administered 2017-04-08: .2 mg via INTRAVENOUS

## 2017-04-08 MED ORDER — FENTANYL CITRATE (PF) 100 MCG/2ML IJ SOLN
INTRAMUSCULAR | Status: DC | PRN
  Administered 2017-04-08: 50 ug via INTRAVENOUS
  Administered 2017-04-08: 25 ug via INTRAVENOUS

## 2017-04-08 SURGICAL SUPPLY — 41 items
BINDER BREAST XLRG (GAUZE/BANDAGES/DRESSINGS) ×3 IMPLANT
BLADE SURG 15 STRL LF DISP TIS (BLADE) ×1 IMPLANT
BLADE SURG 15 STRL SS (BLADE) ×3
CANISTER SUCT 3000ML PPV (MISCELLANEOUS) ×3 IMPLANT
CHLORAPREP W/TINT 26ML (MISCELLANEOUS) ×3 IMPLANT
CLIP VESOCCLUDE SM WIDE 6/CT (CLIP) ×3 IMPLANT
COVER PROBE W GEL 5X96 (DRAPES) ×3 IMPLANT
COVER SURGICAL LIGHT HANDLE (MISCELLANEOUS) ×3 IMPLANT
DERMABOND ADVANCED (GAUZE/BANDAGES/DRESSINGS) ×2
DERMABOND ADVANCED .7 DNX12 (GAUZE/BANDAGES/DRESSINGS) ×1 IMPLANT
DEVICE DUBIN SPECIMEN MAMMOGRA (MISCELLANEOUS) ×3 IMPLANT
DRAPE CHEST BREAST 15X10 FENES (DRAPES) ×3 IMPLANT
DRAPE UTILITY XL STRL (DRAPES) ×3 IMPLANT
ELECT COATED BLADE 2.86 ST (ELECTRODE) ×3 IMPLANT
ELECT REM PT RETURN 9FT ADLT (ELECTROSURGICAL) ×3
ELECTRODE REM PT RTRN 9FT ADLT (ELECTROSURGICAL) ×1 IMPLANT
GAUZE SPONGE 4X4 12PLY STRL (GAUZE/BANDAGES/DRESSINGS) ×3 IMPLANT
GLOVE BIOGEL PI IND STRL 8.5 (GLOVE) ×1 IMPLANT
GLOVE BIOGEL PI INDICATOR 8.5 (GLOVE) ×2
GLOVE ECLIPSE 8.0 STRL XLNG CF (GLOVE) ×3 IMPLANT
GLOVE SURG SIGNA 7.5 PF LTX (GLOVE) ×6 IMPLANT
GLOVE SURG SS PI 6.5 STRL IVOR (GLOVE) ×3 IMPLANT
GOWN STRL REUS W/ TWL XL LVL3 (GOWN DISPOSABLE) ×2 IMPLANT
GOWN STRL REUS W/TWL XL LVL3 (GOWN DISPOSABLE) ×6
KIT BASIN OR (CUSTOM PROCEDURE TRAY) ×3 IMPLANT
KIT MARKER MARGIN INK (KITS) ×3 IMPLANT
NEEDLE HYPO 25GX1X1/2 BEV (NEEDLE) ×3 IMPLANT
NS IRRIG 1000ML POUR BTL (IV SOLUTION) IMPLANT
PACK SURGICAL SETUP 50X90 (CUSTOM PROCEDURE TRAY) ×3 IMPLANT
PAD ABD 8X10 STRL (GAUZE/BANDAGES/DRESSINGS) ×3 IMPLANT
PENCIL BUTTON HOLSTER BLD 10FT (ELECTRODE) ×3 IMPLANT
SPONGE LAP 18X18 X RAY DECT (DISPOSABLE) ×3 IMPLANT
SUT MNCRL AB 4-0 PS2 18 (SUTURE) ×3 IMPLANT
SUT VIC AB 3-0 SH 8-18 (SUTURE) ×3 IMPLANT
SYR BULB 3OZ (MISCELLANEOUS) ×3 IMPLANT
SYR CONTROL 10ML LL (SYRINGE) ×3 IMPLANT
TOWEL GREEN STERILE (TOWEL DISPOSABLE) ×3 IMPLANT
TOWEL OR 17X24 6PK STRL BLUE (TOWEL DISPOSABLE) ×3 IMPLANT
TUBE CONNECTING 12'X1/4 (SUCTIONS) ×1
TUBE CONNECTING 12X1/4 (SUCTIONS) ×2 IMPLANT
YANKAUER SUCT BULB TIP NO VENT (SUCTIONS) ×3 IMPLANT

## 2017-04-08 NOTE — Transfer of Care (Signed)
Immediate Anesthesia Transfer of Care Note  Patient: [name]  Procedure(s) Performed: LEFT BREAST LUMPECTOMY WITH RADIOACTIVE SEED LOCALIZATION (Left Breast)  Patient Location: PACU  Anesthesia Type:General  Level of Consciousness: awake and patient cooperative  Airway & Oxygen Therapy: Patient Spontanous Breathing  Post-op Assessment: Report given to RN and Post -op Vital signs reviewed and stable  Post vital signs: Reviewed and stable  Last Vitals:  Vitals:   04/08/17 0850  BP: (!) 157/64  Pulse: 66  Resp: 20  Temp: 36.4 C  SpO2: 98%    Last Pain:  Vitals:   04/08/17 0850  TempSrc: Oral      Patients Stated Pain Goal: 2 (75/17/00 1749)  Complications: No apparent anesthesia complications

## 2017-04-08 NOTE — Interval H&P Note (Signed)
History and Physical Interval Note:  04/08/2017 10:23 AM  [name]  has presented today for surgery, with the diagnosis of ADH OF FIBROADENOMA  The various methods of treatment have been discussed with the patient and family.  Dropped off by her daughter Colletta [place].  Her son, Shirley Friar, will pick her up.  After consideration of risks, benefits and other options for treatment, the patient has consented to  Procedure(s): BREAST LUMPECTOMY WITH RADIOACTIVE SEED LOCALIZATION (Left) as a surgical intervention .  The patient's history has been reviewed, patient examined, no change in status, stable for surgery.  I have reviewed the patient's chart and labs.  Questions were answered to the patient's satisfaction.     Shann Medal

## 2017-04-08 NOTE — Anesthesia Preprocedure Evaluation (Addendum)
Anesthesia Evaluation  Patient identified by MRN, date of birth, ID band Patient awake    Reviewed: Allergy & Precautions, NPO status , Patient's Chart, lab work & pertinent test results  History of Anesthesia Complications (+) PONV and history of anesthetic complications  Airway Mallampati: II  TM Distance: >3 FB Neck ROM: Full    Dental no notable dental hx.    Pulmonary neg pulmonary ROS, COPD,  COPD inhaler, former smoker,    Pulmonary exam normal breath sounds clear to auscultation       Cardiovascular hypertension, Pt. 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 b. 2013: echo showing EF of 50-55%. c. 05/2016: NSTEMI with cath showing normal cors and periapical akniesis --> consistent with Takotsubo Cardiomyopathy. EF down to 25-30%.  EKG 12/09/16: NSR  Echo 11/26/16:  - Left ventricle: The cavity size was mildly dilated. Systolicfunction was normal. The estimated ejection fraction was in therange of 55% to 60%. Wall motion was normal; there were noregional wall motion abnormalities. Left ventricular diastolicfunction parameters were normal.  Cardiac cath 05/18/16:  1. Normal coronary arteries 2. Severe LV systolic dysfunction, LVEF estimated 30-35% with periapical akinesis, typical appearance of apical ballooning syndrome - Conclusion: Acute Takotsubo Syndrome     Reproductive/Obstetrics negative  OB ROS                            Anesthesia Physical Anesthesia Plan  ASA: III  Anesthesia Plan: General   Post-op Pain Management:    Induction: Intravenous  PONV Risk Score and Plan: 3 and Ondansetron, Dexamethasone, Treatment may vary due to age or medical condition and Scopolamine patch - Pre-op  Airway Management Planned: Oral ETT and LMA  Additional Equipment:   Intra-op Plan:   Post-operative Plan: Extubation in OR  Informed Consent:   Plan Discussed with:   Anesthesia Plan Comments: (  )        Anesthesia Quick Evaluation

## 2017-04-08 NOTE — Op Note (Addendum)
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  ANESTHESIA:   general  Anesthesiologist: Lyn Hollingshead, MD CRNA: Lance Coon, CRNA; Orlie Dakin, CRNA  General  EBL:  minimal  ml  DRAINS:  none   LOCAL MEDICATIONS USED:   30 cc 1/4% marcaine  SPECIMEN:   Left breast biopsy (6 color paint kit)  COUNTS CORRECT:  YES  INDICATIONS FOR PROCEDURE:  [name] is a 61 y.o. (DOB: 1955/08/15) white female whose primary care physician is Ladell Pier, MD and comes for left breast lumpectomy.   She had a left breast biopsy  On 09/16/2016.  The biopsy showed a fibroadenoma with ADH.  She now comes for excision of this mass.   The indications and potential complications of surgery were explained to the patient. Potential complications include, but are not limited to, bleeding, infection, the need for further surgery, and nerve injury.     She had a I131 seed placed on 04/07/2017 in her left breast at [facility].  The seed is in the 1 o'clock position of the left breast.  OPERATIVE NOTE:   The patient was taken to room # 2 at [facility] where she underwent a general anesthesia  supervised by Anesthesiologist: Lyn Hollingshead, MD CRNA: Lance Coon, CRNA; Orlie Dakin, CRNA. Her left breast and axilla were prepped with  ChloraPrep and sterilely draped.    A time-out and the surgical check list was reviewed.    The mass was located at about at the 1 o'clock position of the left breast.   I used the Neoprobe to identify the I131 seed.  I tried to excise an area around the tumor of at least 1 cm.    I excised this block of breast  tissue approximately 3 cm by 4 cm  in diameter.   I painted the lumpectomy specimen with the 6 color paint kit and did a specimen mammogram which confirmed the mass, clip, and the seed were all in the right position in the specimen.  The specimen was sent to pathology who called back to confirm that they have the seed and the specimen.   I then irrigated the wound with saline. I infiltrated approximately 30 mL of 1/4% Marcaine between the incisions.   I then closed all the wounds in layers using 3-0 Vicryl sutures for the deep layer. At the skin, I closed the incisions with a 4-0 Monocryl suture. The incisions were then painted with Dermabond.  She had gauze place over the wounds and placed in a breast binder.   The patient tolerated the procedure well, was transported to the recovery room in good condition. Sponge and needle count were correct at the end of the case.   Final pathology is pending.   Alphonsa Overall, MD, [facility] Pager: [phone] Office phone:  [phone]

## 2017-04-08 NOTE — Anesthesia Procedure Notes (Signed)
Procedure Name: LMA Insertion Date/Time: 04/08/2017 10:56 AM Performed by: Orlie Dakin, CRNA Pre-anesthesia Checklist: Patient identified, Emergency Drugs available, Suction available, Patient being monitored and Timeout performed Patient Re-evaluated:Patient Re-evaluated prior to induction Oxygen Delivery Method: Circle system utilized Preoxygenation: Pre-oxygenation with 100% oxygen Induction Type: IV induction Ventilation: Mask ventilation without difficulty LMA: LMA inserted LMA Size: 5.0 Tube type: Oral Number of attempts: 2 Placement Confirmation: positive ETCO2 and breath sounds checked- equal and bilateral Tube secured with: Tape Dental Injury: Teeth and Oropharynx as per pre-operative assessment  Comments: LMA 4 placed with large leak, removed and LMA 5 then placed with improved seal and only small leak with hand vent via vent bag.

## 2017-04-08 NOTE — Anesthesia Postprocedure Evaluation (Signed)
Anesthesia Post Note  Patient: [name]  Procedure(s) Performed: LEFT BREAST LUMPECTOMY WITH RADIOACTIVE SEED LOCALIZATION (Left Breast)     Patient location during evaluation: PACU Anesthesia Type: General Level of consciousness: awake Pain management: pain level controlled Vital Signs Assessment: post-procedure vital signs reviewed and stable Respiratory status: spontaneous breathing Cardiovascular status: stable Postop Assessment: no apparent nausea or vomiting Anesthetic complications: no    Last Vitals:  Vitals:   04/08/17 1257 04/08/17 1315  BP: (!) 165/79 (!) 168/82  Pulse: 69 65  Resp: 14 14  Temp:    SpO2: 93% 96%    Last Pain:  Vitals:   04/08/17 1257  TempSrc:   PainSc: 2    Pain Goal: Patients Stated Pain Goal: 2 (04/08/17 0918)               Thaddus Mcdowell JR,JOHN Mate[facility]

## 2017-04-08 NOTE — Discharge Instructions (Signed)
[facility] [place] [facility] - DISCHARGE INSTRUCTIONS TO PATIENT  Activity:  Driving - May drive tomorrow if doing well   Lifting - no lifting more than 15 pounds for 7 days, then no limit  Wound Care:   Leave incision dry for 2 days, then may remove bandage and shower  Diet:  As tolerated  Follow up appointment:  Call Dr. Pollie Friar office [facility]) at [phone] for an appointment in 2 to 4 weeks.  Medications and dosages:  Resume your home medications.  You have a prescription for:  Vicodin  Call Dr. Lucia Gaskins or his office  [phone]) if you have:  Temperature greater than 100.4,  Persistent nausea and vomiting,  Severe uncontrolled pain,  Redness, tenderness, or signs of infection (pain, swelling, redness, odor or green/yellow discharge around the site),  Difficulty breathing, headache or visual disturbances,  Any other questions or concerns you may have after discharge.  In an emergency, call 911 or go to an Emergency Department at a nearby hospital.

## 2017-04-09 ENCOUNTER — Encounter (HOSPITAL_COMMUNITY): Payer: Self-pay | Admitting: Surgery

## 2017-05-25 ENCOUNTER — Encounter (HOSPITAL_COMMUNITY): Payer: Self-pay | Admitting: *Deleted

## 2017-07-12 ENCOUNTER — Encounter: Payer: Self-pay | Admitting: Internal Medicine

## 2017-07-14 ENCOUNTER — Other Ambulatory Visit: Payer: Self-pay

## 2017-07-14 ENCOUNTER — Encounter (HOSPITAL_COMMUNITY): Payer: Self-pay | Admitting: Emergency Medicine

## 2017-07-14 ENCOUNTER — Emergency Department (HOSPITAL_COMMUNITY)
Admission: EM | Admit: 2017-07-14 | Discharge: 2017-07-14 | Disposition: A | Discharge status: Home / Self Care | Payer: Medicaid Other | Attending: Emergency Medicine | Admitting: Emergency Medicine

## 2017-07-14 DIAGNOSIS — Z79899 Other long term (current) drug therapy: Secondary | ICD-10-CM | POA: Insufficient documentation

## 2017-07-14 DIAGNOSIS — J449 Chronic obstructive pulmonary disease, unspecified: Secondary | ICD-10-CM | POA: Insufficient documentation

## 2017-07-14 DIAGNOSIS — Z7982 Long term (current) use of aspirin: Secondary | ICD-10-CM | POA: Insufficient documentation

## 2017-07-14 DIAGNOSIS — Z87891 Personal history of nicotine dependence: Secondary | ICD-10-CM | POA: Insufficient documentation

## 2017-07-14 DIAGNOSIS — F410 Panic disorder [episodic paroxysmal anxiety] without agoraphobia: Secondary | ICD-10-CM | POA: Insufficient documentation

## 2017-07-14 DIAGNOSIS — I252 Old myocardial infarction: Secondary | ICD-10-CM | POA: Insufficient documentation

## 2017-07-14 DIAGNOSIS — I509 Heart failure, unspecified: Secondary | ICD-10-CM | POA: Insufficient documentation

## 2017-07-14 DIAGNOSIS — I110 Hypertensive heart disease with heart failure: Secondary | ICD-10-CM | POA: Insufficient documentation

## 2017-07-14 DIAGNOSIS — R42 Dizziness and giddiness: Secondary | ICD-10-CM | POA: Insufficient documentation

## 2017-07-14 MED ORDER — MECLIZINE HCL 25 MG PO TABS: 25.0000 mg | ORAL_TABLET | Freq: Three times a day (TID) | ORAL | 0 refills | Status: DC | PRN

## 2017-07-14 NOTE — Discharge Instructions (Signed)
Call the number on these tomorrow instructions to get a primary care physician.  Your blood pressure should be rechecked within the next 3 weeks.  Today's was mildly elevated at 140/77.  Take the medication prescribed as needed for vertigo

## 2017-07-14 NOTE — ED Triage Notes (Signed)
Pt BIB [facility],  c/o dizziness with movement, worse when rolling over in bed, shortness of breath and anxiety x 2 days. Pt reports hx anxiety and COPD, shortness of breath worse when feeling anxious.EMS vitals: BP 156/85, HR 71, CBG 112, SpO2 100% 2L [place]

## 2017-07-14 NOTE — ED Provider Notes (Signed)
[facility] EMERGENCY DEPARTMENT Provider Note   CSN: [id number] Arrival date & time: 07/14/17  1808     History   Chief Complaint Chief Complaint  Patient presents with  . Dizziness    HPI [name] is a 62 y.o. female.  She developed dizziness meaning sensation of room spinning onset yesterday morning.  Symptoms worse with moving her head and improved with remaining still associated symptoms include nausea and anxiety.  She has no visual changes no change in speech no focal numbness or weakness.  She did have tinnitus in her right ear briefly yesterday which has resolved.  She is presently asymptomatic.  Reported shortness of breath and tingling in her hands intermittently for the past 2 days and that is states she is having panic attacks which she has several years ago  HPI  Past Medical History:  Diagnosis Date  . Anemia   . Anxiety   . CHF (congestive heart failure) ([place])   . COPD (chronic obstructive pulmonary disease) ([place])   . Dyspnea   . Dysrhythmia   . GERD (gastroesophageal reflux disease)   . GI bleed 05/2016  . Hyperlipidemia   . Hypertension   . Myocardial infarction ([place])   . Nonischemic cardiomyopathy ([place])    a. stress-induced cardiomyopathy by cath 2010 (EF % at that time) - normal cors 2010 & 2012. b. 2013: echo showing EF of 50-55%. c. 05/2016: NSTEMI with cath showing normal cors and periapical akniesis --> consistent with Takotsubo Cardiomyopathy. EF down to 25-30%.  . Obesity   . PONV (postoperative nausea and vomiting)   . Takotsubo cardiomyopathy   . Thyroid disease   . Tobacco abuse     Patient Active Problem List   Diagnosis Date Noted  . NSTEMI (non-ST elevated myocardial infarction) ([place]) 05/17/2016  . History of GI bleed 05/17/2016  . Chest pain 05/17/2016  . HCAP (healthcare-associated pneumonia) 05/17/2016  . BRBPR (bright red blood per rectum) 05/03/2016  . Pain of both breasts 02/16/2016  . Takotsubo  cardiomyopathy 01/18/2013  . Essential hypertension 01/18/2013  . Hyperlipidemia 01/18/2013  . COPD (chronic obstructive pulmonary disease) ([place]) 01/18/2013    Past Surgical History:  Procedure Laterality Date  . BREAST LUMPECTOMY WITH RADIOACTIVE SEED LOCALIZATION Left 04/08/2017   Procedure: LEFT BREAST LUMPECTOMY WITH RADIOACTIVE SEED LOCALIZATION;  Surgeon: Alphonsa Overall, MD;  Location: [facility];  Service: General;  Laterality: Left;  . CARDIAC CATHETERIZATION  2010   no signficant CAD. EF 30%, apical ballooning, takotsubo cardiomyopathy  . CARDIAC CATHETERIZATION N/A 05/18/2016   Procedure: Left Heart Cath and Coronary Angiography;  Surgeon: Sherren Mocha, MD;  Location: [facility];  Service: Cardiovascular;  Laterality: N/A;  . TRANSTHORACIC ECHOCARDIOGRAM  05/2011   EF 50-55%    OB History    Gravida Para Term Preterm AB Living   2         2   SAB TAB Ectopic Multiple Live Births           2       Home Medications    Prior to Admission medications   Medication Sig Start Date End Date Taking? Authorizing Provider  albuterol (PROVENTIL HFA;VENTOLIN HFA) 108 (90 Base) MCG/ACT inhaler Inhale 2 puffs into the lungs every 4 (four) hours as needed for wheezing or shortness of breath. 02/16/16   Boykin Nearing, MD  aspirin EC 81 MG tablet Take 1 tablet (81 mg total) by mouth daily. Patient not taking: Reported on 04/01/2017  02/16/16   Funches, Adriana Mccallum, MD  budesonide-formoterol (SYMBICORT) 160-4.5 MCG/ACT inhaler Inhale 2 puffs into the lungs 2 (two) times daily. Via patient assistance, patient has paperwork 02/16/16   Boykin Nearing, MD  carvedilol (COREG) 3.125 MG tablet Take 1 tablet (3.125 mg total) by mouth 2 (two) times daily with a meal. 12/09/16   Hilty, Nadean Corwin, MD  HYDROcodone-acetaminophen (NORCO/VICODIN) 5-325 MG tablet Take 1-2 tablets by mouth every 6 (six) hours as needed for moderate pain. 04/08/17   Alphonsa Overall, MD  lisinopril (PRINIVIL,ZESTRIL) 5 MG  tablet Take 1 tablet (5 mg total) by mouth daily. 12/09/16 12/04/17  Pixie Casino, MD  ranitidine (ZANTAC) 150 MG tablet Take 150 mg by mouth daily as needed for heartburn.    [provider]  simvastatin (ZOCOR) 20 MG tablet Take 1 tablet (20 mg total) by mouth at bedtime. 12/09/16   Hilty, Nadean Corwin, MD    Family History Family History  Problem Relation Age of Onset  . Lung cancer Mother        died in July 31, 2003  . COPD Father   . Heart disease Brother        CABG at age 77  . Breast cancer Maternal Grandmother     Social History Social History   Tobacco Use  . Smoking status: Former Smoker    Last attempt to quit: 04/03/2003    Years since quitting: 14.2  . Smokeless tobacco: Never Used  Substance Use Topics  . Alcohol use: No  . Drug use: No     Allergies   Patient has no known allergies.   Review of Systems Review of Systems  Constitutional: Negative.   HENT: Positive for tinnitus.   Respiratory: Positive for shortness of breath.   Cardiovascular: Negative.   Gastrointestinal: Negative.   Musculoskeletal: Negative.   Skin: Negative.   Neurological: Positive for dizziness.  Psychiatric/Behavioral: Negative.        Anxiety  All other systems reviewed and are negative.    Physical Exam Updated Vital Signs BP 140/77   Pulse 66   Temp 98.3 F (36.8 C) (Oral)   Resp 16   Ht 5\' 6"  (1.676 m)   Wt 94.3 kg (208 lb)   SpO2 98%   BMI 33.57 kg/m   Physical Exam  Constitutional: She is oriented to person, place, and time. She appears well-developed and well-nourished.  HENT:  Head: Normocephalic and atraumatic.  No facial asymmetry.  Bilateral tympanic membranes normal  Eyes: Conjunctivae are normal. Pupils are equal, round, and reactive to light.  Neck: Neck supple. No tracheal deviation present. No thyromegaly present.  No bruit  Cardiovascular: Normal rate and regular rhythm.  No murmur heard. Pulmonary/Chest: Effort normal and breath sounds normal.   Abdominal: Soft. Bowel sounds are normal. She exhibits no distension. There is no tenderness.  Musculoskeletal: Normal range of motion. She exhibits no edema or tenderness.  Neurological: She is alert and oriented to person, place, and time. Coordination normal.  Gait normal Romberg normal pronator drift normal finger to nose normal she reports slight symptoms of room spinning when she stands up from a supine position, which resolved after approximately 15 seconds.  Skin: Skin is warm and dry. Capillary refill takes less than 2 seconds. No rash noted.  Psychiatric: She has a normal mood and affect.  Nursing note and vitals reviewed.    ED Treatments / Results  Labs (all labs ordered are listed, but only abnormal results are displayed) Labs Reviewed -  No data to display  EKG  EKG Interpretation  Date/Time:  Thursday July 14 2017 18:14:37 EDT Ventricular Rate:  73 PR Interval:    QRS Duration: 102 QT Interval:  391 QTC Calculation: 431 R Axis:   57 Text Interpretation:  Sinus rhythm Baseline wander in lead(s) II III aVF No significant change since last tracing Confirmed by Orlie Dakin [phone]) on 07/14/2017 6:27:07 PM       Radiology No results found.  Procedures Procedures (including critical care time)  Medications Ordered in ED Medications - No data to display   Initial Impression / Assessment and Plan / ED Course  I have reviewed the triage vital signs and the nursing notes.  Pertinent labs & imaging results that were available during my care of the patient were reviewed by me and considered in my medical decision making (see chart for details).     Pulse oximetry remained normal after patient taken off of oxygen and she denied dyspnea.  Feel the patient is exhibiting symptoms of central vertigo.  Plan prescription meclizine.  Referral to primary care as she states she presently does not have one blood pressure recheck 3 weeks .  No further diagnostic testing  needed.  Patient in agreement Final Clinical Impressions(s) / ED Diagnoses  Dx #1 vertigo #2 panic attack #3 elevated blood pressure Final diagnoses:  None    ED Discharge Orders    None       Orlie Dakin, MD 07/14/17 1907

## 2017-10-02 ENCOUNTER — Encounter: Payer: Self-pay | Admitting: Internal Medicine

## 2017-10-03 MED ORDER — FUROSEMIDE 40 MG PO TABS: 40.0000 mg | ORAL_TABLET | Freq: Every day | ORAL | 0 refills | Status: DC

## 2017-11-21 ENCOUNTER — Encounter: Payer: Self-pay | Admitting: Internal Medicine

## 2017-11-21 MED ORDER — LISINOPRIL 5 MG PO TABS: 5.0000 mg | ORAL_TABLET | Freq: Every day | ORAL | 0 refills | Status: DC

## 2017-11-22 NOTE — Telephone Encounter (Signed)
Encounter opened in error

## 2018-01-03 ENCOUNTER — Other Ambulatory Visit: Payer: Self-pay | Admitting: Internal Medicine

## 2018-04-14 ENCOUNTER — Other Ambulatory Visit: Payer: Self-pay | Admitting: Internal Medicine

## 2018-07-11 ENCOUNTER — Other Ambulatory Visit: Payer: Self-pay | Admitting: Internal Medicine

## 2018-07-12 ENCOUNTER — Other Ambulatory Visit: Payer: Self-pay | Admitting: Internal Medicine

## 2018-07-14 ENCOUNTER — Other Ambulatory Visit: Payer: Self-pay | Admitting: Internal Medicine

## 2018-07-17 NOTE — Telephone Encounter (Signed)
Attempted to contact patient her number is disconnected.

## 2018-07-18 ENCOUNTER — Other Ambulatory Visit: Payer: Self-pay | Admitting: Internal Medicine

## 2018-08-08 ENCOUNTER — Other Ambulatory Visit: Payer: Self-pay | Admitting: Internal Medicine

## 2018-08-08 NOTE — Telephone Encounter (Signed)
Can we arrange for an eVisit and blood pressure check before refilling these?  Dr. Lemmie Evens

## 2018-08-09 NOTE — Telephone Encounter (Signed)
Attempted to contact pt to scheduled virtual visit. Phone listed on chart is not a working number.

## 2018-08-12 ENCOUNTER — Other Ambulatory Visit: Payer: Self-pay | Admitting: Internal Medicine

## 2018-09-12 ENCOUNTER — Other Ambulatory Visit: Payer: Self-pay | Admitting: Internal Medicine

## 2018-10-21 ENCOUNTER — Other Ambulatory Visit: Payer: Self-pay | Admitting: Internal Medicine

## 2018-12-08 ENCOUNTER — Other Ambulatory Visit: Payer: Self-pay | Admitting: Internal Medicine

## 2018-12-26 ENCOUNTER — Emergency Department (HOSPITAL_COMMUNITY): Payer: Self-pay

## 2018-12-26 ENCOUNTER — Inpatient Hospital Stay (HOSPITAL_COMMUNITY)
Admission: EM | Admit: 2018-12-26 | Discharge: 2018-12-27 | DRG: 281 | Disposition: A | Discharge status: Home / Self Care | Payer: Self-pay | Attending: Internal Medicine | Admitting: Internal Medicine

## 2018-12-26 ENCOUNTER — Encounter (HOSPITAL_COMMUNITY): Payer: Self-pay | Admitting: Emergency Medicine

## 2018-12-26 ENCOUNTER — Other Ambulatory Visit: Payer: Self-pay

## 2018-12-26 DIAGNOSIS — I509 Heart failure, unspecified: Secondary | ICD-10-CM | POA: Diagnosis present

## 2018-12-26 DIAGNOSIS — Z7982 Long term (current) use of aspirin: Secondary | ICD-10-CM

## 2018-12-26 DIAGNOSIS — Z803 Family history of malignant neoplasm of breast: Secondary | ICD-10-CM

## 2018-12-26 DIAGNOSIS — E669 Obesity, unspecified: Secondary | ICD-10-CM | POA: Diagnosis present

## 2018-12-26 DIAGNOSIS — I130 Hypertensive heart and chronic kidney disease with heart failure and stage 1 through stage 4 chronic kidney disease, or unspecified chronic kidney disease: Secondary | ICD-10-CM | POA: Diagnosis present

## 2018-12-26 DIAGNOSIS — E785 Hyperlipidemia, unspecified: Secondary | ICD-10-CM | POA: Diagnosis present

## 2018-12-26 DIAGNOSIS — Z6833 Body mass index (BMI) 33.0-33.9, adult: Secondary | ICD-10-CM

## 2018-12-26 DIAGNOSIS — Z825 Family history of asthma and other chronic lower respiratory diseases: Secondary | ICD-10-CM

## 2018-12-26 DIAGNOSIS — Z87891 Personal history of nicotine dependence: Secondary | ICD-10-CM

## 2018-12-26 DIAGNOSIS — I5181 Takotsubo syndrome: Secondary | ICD-10-CM

## 2018-12-26 DIAGNOSIS — N183 Chronic kidney disease, stage 3 unspecified: Secondary | ICD-10-CM

## 2018-12-26 DIAGNOSIS — J449 Chronic obstructive pulmonary disease, unspecified: Secondary | ICD-10-CM | POA: Diagnosis present

## 2018-12-26 DIAGNOSIS — Z20828 Contact with and (suspected) exposure to other viral communicable diseases: Secondary | ICD-10-CM | POA: Diagnosis present

## 2018-12-26 DIAGNOSIS — I10 Essential (primary) hypertension: Secondary | ICD-10-CM | POA: Diagnosis present

## 2018-12-26 DIAGNOSIS — Z79899 Other long term (current) drug therapy: Secondary | ICD-10-CM

## 2018-12-26 DIAGNOSIS — N1832 Chronic kidney disease, stage 3b: Secondary | ICD-10-CM | POA: Diagnosis present

## 2018-12-26 DIAGNOSIS — I428 Other cardiomyopathies: Secondary | ICD-10-CM | POA: Diagnosis present

## 2018-12-26 DIAGNOSIS — Z7951 Long term (current) use of inhaled steroids: Secondary | ICD-10-CM

## 2018-12-26 DIAGNOSIS — K219 Gastro-esophageal reflux disease without esophagitis: Secondary | ICD-10-CM | POA: Diagnosis present

## 2018-12-26 DIAGNOSIS — E1122 Type 2 diabetes mellitus with diabetic chronic kidney disease: Secondary | ICD-10-CM | POA: Diagnosis present

## 2018-12-26 DIAGNOSIS — I2489 Other forms of acute ischemic heart disease: Secondary | ICD-10-CM | POA: Diagnosis present

## 2018-12-26 DIAGNOSIS — Z801 Family history of malignant neoplasm of trachea, bronchus and lung: Secondary | ICD-10-CM

## 2018-12-26 DIAGNOSIS — Z8249 Family history of ischemic heart disease and other diseases of the circulatory system: Secondary | ICD-10-CM

## 2018-12-26 DIAGNOSIS — I214 Non-ST elevation (NSTEMI) myocardial infarction: Principal | ICD-10-CM | POA: Diagnosis present

## 2018-12-26 DIAGNOSIS — I252 Old myocardial infarction: Secondary | ICD-10-CM

## 2018-12-26 DIAGNOSIS — F419 Anxiety disorder, unspecified: Secondary | ICD-10-CM | POA: Diagnosis present

## 2018-12-26 LAB — BASIC METABOLIC PANEL
Anion gap: 10 (ref 5–15)
BUN: 13 mg/dL (ref 8–23)
CO2: 21 mmol/L — ABNORMAL LOW (ref 22–32)
Calcium: 8.9 mg/dL (ref 8.9–10.3)
Chloride: 104 mmol/L (ref 98–111)
Creatinine, Ser: 1.17 mg/dL — ABNORMAL HIGH (ref 0.44–1.00)
GFR calc Af Amer: 58 mL/min — ABNORMAL LOW (ref >60)
GFR calc non Af Amer: 50 mL/min — ABNORMAL LOW (ref >60)
Glucose, Bld: 143 mg/dL — ABNORMAL HIGH (ref 70–99)
Potassium: 4.2 mmol/L (ref 3.5–5.1)
Sodium: 135 mmol/L (ref 135–145)

## 2018-12-26 LAB — CK: Total CK: 63 U/L (ref 38–234)

## 2018-12-26 LAB — CBC
HCT: 39.6 % (ref 36.0–46.0)
Hemoglobin: 13.4 g/dL (ref 12.0–15.0)
MCH: 30.6 pg (ref 26.0–34.0)
MCHC: 33.8 g/dL (ref 30.0–36.0)
MCV: 90.4 fL (ref 80.0–100.0)
Platelets: 231 10*3/uL (ref 150–400)
RBC: 4.38 MIL/uL (ref 3.87–5.11)
RDW: 12.9 % (ref 11.5–15.5)
WBC: 6.9 10*3/uL (ref 4.0–10.5)
nRBC: 0 % (ref 0.0–0.2)

## 2018-12-26 LAB — TROPONIN I (HIGH SENSITIVITY)
Troponin I (High Sensitivity): 197 ng/L (ref <18)
Troponin I (High Sensitivity): 1103 ng/L (ref <18)

## 2018-12-26 LAB — BRAIN NATRIURETIC PEPTIDE: B Natriuretic Peptide: 28.1 pg/mL (ref 0.0–100.0)

## 2018-12-26 MED ORDER — ASPIRIN 81 MG PO CHEW: 324.0000 mg | CHEWABLE_TABLET | Freq: Once | ORAL | Status: DC

## 2018-12-26 MED ORDER — LORAZEPAM 2 MG/ML IJ SOLN
1.0000 mg | Freq: Once | INTRAMUSCULAR | Status: AC
  Administered 2018-12-26: 1 mg via INTRAVENOUS
  Filled 2018-12-26: qty 1

## 2018-12-26 NOTE — ED Notes (Signed)
Date and time results received: 12/26/18  (use smartphrase ".now" to insert current time)  Test: trop Critical Value: 197  Name of Provider Notified:
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
## 2018-12-26 NOTE — ED Notes (Signed)
Date and time results received: 12/26/18  (use smartphrase ".now" to insert current time)  Test:trop Critical Value: 1103
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
## 2018-12-27 ENCOUNTER — Encounter (HOSPITAL_COMMUNITY): Payer: Self-pay | Admitting: Family Medicine

## 2018-12-27 ENCOUNTER — Inpatient Hospital Stay (HOSPITAL_COMMUNITY): Payer: Self-pay

## 2018-12-27 ENCOUNTER — Inpatient Hospital Stay (HOSPITAL_COMMUNITY): Payer: No Typology Code available for payment source

## 2018-12-27 DIAGNOSIS — R079 Chest pain, unspecified: Secondary | ICD-10-CM

## 2018-12-27 DIAGNOSIS — R0602 Shortness of breath: Secondary | ICD-10-CM

## 2018-12-27 DIAGNOSIS — I214 Non-ST elevation (NSTEMI) myocardial infarction: Secondary | ICD-10-CM

## 2018-12-27 DIAGNOSIS — N183 Chronic kidney disease, stage 3 unspecified: Secondary | ICD-10-CM | POA: Diagnosis present

## 2018-12-27 DIAGNOSIS — I10 Essential (primary) hypertension: Secondary | ICD-10-CM

## 2018-12-27 DIAGNOSIS — N1832 Chronic kidney disease, stage 3b: Secondary | ICD-10-CM | POA: Diagnosis present

## 2018-12-27 DIAGNOSIS — J42 Unspecified chronic bronchitis: Secondary | ICD-10-CM

## 2018-12-27 LAB — CBC
HCT: 39.8 % (ref 36.0–46.0)
Hemoglobin: 13 g/dL (ref 12.0–15.0)
MCH: 30.2 pg (ref 26.0–34.0)
MCHC: 32.7 g/dL (ref 30.0–36.0)
MCV: 92.3 fL (ref 80.0–100.0)
Platelets: 242 10*3/uL (ref 150–400)
RBC: 4.31 MIL/uL (ref 3.87–5.11)
RDW: 13 % (ref 11.5–15.5)
WBC: 6.9 10*3/uL (ref 4.0–10.5)
nRBC: 0 % (ref 0.0–0.2)

## 2018-12-27 LAB — TROPONIN I (HIGH SENSITIVITY)
Troponin I (High Sensitivity): 2067 ng/L (ref <18)
Troponin I (High Sensitivity): 2185 ng/L (ref <18)

## 2018-12-27 LAB — SARS CORONAVIRUS 2 (TAT 6-24 HRS): SARS Coronavirus 2: NEGATIVE

## 2018-12-27 LAB — COMPREHENSIVE METABOLIC PANEL
ALT: 11 U/L (ref 0–44)
AST: 22 U/L (ref 15–41)
Albumin: 3.6 g/dL (ref 3.5–5.0)
Alkaline Phosphatase: 84 U/L (ref 38–126)
Anion gap: 9 (ref 5–15)
BUN: 11 mg/dL (ref 8–23)
CO2: 22 mmol/L (ref 22–32)
Calcium: 9.2 mg/dL (ref 8.9–10.3)
Chloride: 108 mmol/L (ref 98–111)
Creatinine, Ser: 1.14 mg/dL — ABNORMAL HIGH (ref 0.44–1.00)
GFR calc Af Amer: >60 mL/min (ref >60)
GFR calc non Af Amer: 52 mL/min — ABNORMAL LOW (ref >60)
Glucose, Bld: 100 mg/dL — ABNORMAL HIGH (ref 70–99)
Potassium: 4.2 mmol/L (ref 3.5–5.1)
Sodium: 139 mmol/L (ref 135–145)
Total Bilirubin: 0.7 mg/dL (ref 0.3–1.2)
Total Protein: 6.2 g/dL — ABNORMAL LOW (ref 6.5–8.1)

## 2018-12-27 LAB — ECHOCARDIOGRAM COMPLETE
Height: 66 in
Weight: 3331.2 oz

## 2018-12-27 LAB — LIPID PANEL
Cholesterol: 165 mg/dL (ref 0–200)
HDL: 42 mg/dL (ref >40)
LDL Cholesterol: 111 mg/dL — ABNORMAL HIGH (ref 0–99)
Total CHOL/HDL Ratio: 3.9 RATIO
Triglycerides: 62 mg/dL (ref <150)
VLDL: 12 mg/dL (ref 0–40)

## 2018-12-27 LAB — HIV ANTIBODY (ROUTINE TESTING W REFLEX): HIV Screen 4th Generation wRfx: NONREACTIVE

## 2018-12-27 MED ORDER — ASPIRIN EC 81 MG PO TBEC
81.0000 mg | DELAYED_RELEASE_TABLET | Freq: Every day | ORAL | Status: DC
  Administered 2018-12-27: 81 mg via ORAL
  Filled 2018-12-27: qty 1

## 2018-12-27 MED ORDER — PANTOPRAZOLE SODIUM 40 MG PO TBEC
40.0000 mg | DELAYED_RELEASE_TABLET | Freq: Every day | ORAL | Status: DC
  Administered 2018-12-27: 08:00:00 40 mg via ORAL
  Filled 2018-12-27: qty 1

## 2018-12-27 MED ORDER — ONDANSETRON HCL 4 MG/2ML IJ SOLN: 4.0000 mg | Freq: Four times a day (QID) | INTRAMUSCULAR | Status: DC | PRN

## 2018-12-27 MED ORDER — CARVEDILOL 3.125 MG PO TABS: 3.1250 mg | ORAL_TABLET | Freq: Two times a day (BID) | ORAL | 0 refills | Status: DC

## 2018-12-27 MED ORDER — NITROGLYCERIN 0.4 MG SL SUBL
SUBLINGUAL_TABLET | SUBLINGUAL | Status: AC
  Administered 2018-12-27: 14:00:00
  Filled 2018-12-27: qty 2

## 2018-12-27 MED ORDER — ATORVASTATIN CALCIUM 80 MG PO TABS
80.0000 mg | ORAL_TABLET | Freq: Every day | ORAL | Status: DC
  Administered 2018-12-27: 80 mg via ORAL
  Filled 2018-12-27: qty 1

## 2018-12-27 MED ORDER — NITROGLYCERIN 0.4 MG SL SUBL: 0.4000 mg | SUBLINGUAL_TABLET | SUBLINGUAL | Status: DC | PRN

## 2018-12-27 MED ORDER — ATORVASTATIN CALCIUM 80 MG PO TABS: 80.0000 mg | ORAL_TABLET | Freq: Every day | ORAL | 0 refills | Status: DC

## 2018-12-27 MED ORDER — ENOXAPARIN SODIUM 40 MG/0.4ML ~~LOC~~ SOLN
40.0000 mg | SUBCUTANEOUS | Status: DC
  Administered 2018-12-27: 04:00:00 40 mg via SUBCUTANEOUS
  Filled 2018-12-27: qty 0.4

## 2018-12-27 MED ORDER — METOPROLOL TARTRATE 25 MG PO TABS
25.0000 mg | ORAL_TABLET | Freq: Once | ORAL | Status: AC
  Administered 2018-12-27: 25 mg via ORAL
  Filled 2018-12-27: qty 1

## 2018-12-27 MED ORDER — ALBUTEROL SULFATE (2.5 MG/3ML) 0.083% IN NEBU: 3.0000 mL | INHALATION_SOLUTION | RESPIRATORY_TRACT | Status: DC | PRN

## 2018-12-27 MED ORDER — NITROGLYCERIN 0.4 MG SL SUBL
0.8000 mg | SUBLINGUAL_TABLET | Freq: Once | SUBLINGUAL | Status: AC
  Administered 2018-12-27: 0.8 mg via SUBLINGUAL

## 2018-12-27 MED ORDER — CARVEDILOL 3.125 MG PO TABS
3.1250 mg | ORAL_TABLET | Freq: Two times a day (BID) | ORAL | Status: DC
  Administered 2018-12-27 (×2): 3.125 mg via ORAL
  Filled 2018-12-27 (×2): qty 1

## 2018-12-27 MED ORDER — MOMETASONE FURO-FORMOTEROL FUM 200-5 MCG/ACT IN AERO
2.0000 | INHALATION_SPRAY | Freq: Two times a day (BID) | RESPIRATORY_TRACT | Status: DC
  Administered 2018-12-27: 09:00:00 2 via RESPIRATORY_TRACT
  Filled 2018-12-27: qty 8.8

## 2018-12-27 MED ORDER — IOHEXOL 350 MG/ML SOLN
80.0000 mL | Freq: Once | INTRAVENOUS | Status: AC | PRN
  Administered 2018-12-27: 80 mL via INTRAVENOUS

## 2018-12-27 MED ORDER — LISINOPRIL 5 MG PO TABS
5.0000 mg | ORAL_TABLET | Freq: Every day | ORAL | Status: DC
  Administered 2018-12-27: 5 mg via ORAL
  Filled 2018-12-27: qty 1

## 2018-12-27 MED ORDER — ACETAMINOPHEN 325 MG PO TABS: 650.0000 mg | ORAL_TABLET | ORAL | Status: DC | PRN
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
## 2018-12-27 NOTE — H&P (Signed)
History and Physical    PIERCE CRAKER [id number] DOB: [birth year] DOA: 12/26/2018  PCP: Ladell Pier, MD   Patient coming from: Home  Chief Complaint: Chest pain, SOB   HPI: [name] is a 63 y.o. female with medical history significant for COPD, hypertension, hyperlipidemia, chronic kidney disease stage III, and history of stress-induced cardiomyopathy, now presenting to the emergency department for evaluation of acute onset chest pain and shortness of breath.  Patient reports that she had been in her usual state of health until she got into an argument with family and then developed acute onset of left-sided chest pain, shortness of breath, nausea, and diaphoresis.  She describes the symptoms as very similar to her prior episodes where she was found to have normal coronary arteries with periapical akinesis and apical ballooning.  She had severe LV dysfunction following that had resolved by time of transthoracic echo in July 2018.  Patient denies any recent fevers, chills, increased cough, or sick contacts.    ED Course: Upon arrival to the ED, patient is found to be afebrile, saturating well on room air, and with remaining vitals also normal.  EKG features a sinus rhythm with nonspecific T wave abnormalities.  Chest x-ray is negative for acute cardiopulmonary disease.  Chemistry panel is notable for creatinine 1.17, similar to priors.  CBC is unremarkable.  BNP is normal.  CK is normal.  COVID-19 is negative.  Troponin is elevated to 197, then increased further to 1103 and less than 2 hours.  Cardiology was consulted by the ED physician and admission to the hospitalist service was recommended.  Review of Systems:  All other systems reviewed and apart from HPI, are negative.  Past Medical History:  Diagnosis Date  . Anemia   . Anxiety   . CHF (congestive heart failure) ([place])   . COPD (chronic obstructive pulmonary disease) ([place])   . Dyspnea   . Dysrhythmia   . GERD  (gastroesophageal reflux disease)   . GI bleed 05/2016  . Hyperlipidemia   . Hypertension   . Myocardial infarction ([place])   . Nonischemic cardiomyopathy ([place])    a. stress-induced cardiomyopathy by cath 08/15/08 (EF % at that time) - normal cors 08/15/08 & August 16, 2010. b. 16-Aug-2011: echo showing EF of 50-55%. c. 05/2016: NSTEMI with cath showing normal cors and periapical akniesis --> consistent with Takotsubo Cardiomyopathy. EF down to 25-30%.  . Obesity   . PONV (postoperative nausea and vomiting)   . Takotsubo cardiomyopathy   . Thyroid disease   . Tobacco abuse     Past Surgical History:  Procedure Laterality Date  . BREAST LUMPECTOMY WITH RADIOACTIVE SEED LOCALIZATION Left 04/08/2017   Procedure: LEFT BREAST LUMPECTOMY WITH RADIOACTIVE SEED LOCALIZATION;  Surgeon: Alphonsa Overall, MD;  Location: [facility];  Service: General;  Laterality: Left;  . CARDIAC CATHETERIZATION  2008-08-15   no signficant CAD. EF 30%, apical ballooning, takotsubo cardiomyopathy  . CARDIAC CATHETERIZATION N/A 05/18/2016   Procedure: Left Heart Cath and Coronary Angiography;  Surgeon: Sherren Mocha, MD;  Location: [facility];  Service: Cardiovascular;  Laterality: N/A;  . TRANSTHORACIC ECHOCARDIOGRAM  05/2011   EF 50-55%     reports that she quit smoking about 15 years ago. She has never used smokeless tobacco. She reports that she does not drink alcohol or use drugs.  No Known Allergies  Family History  Problem Relation Age of Onset  . Lung cancer Mother        died in Aug 16, 2003  .  COPD Father   . Heart disease Brother        CABG at age 54  . Breast cancer Maternal Grandmother      Prior to Admission medications   Medication Sig Start Date End Date Taking? Authorizing Provider  albuterol (PROVENTIL HFA;VENTOLIN HFA) 108 (90 Base) MCG/ACT inhaler Inhale 2 puffs into the lungs every 4 (four) hours as needed for wheezing or shortness of breath. 02/16/16  Yes Funches, Josalyn, MD  budesonide-formoterol (SYMBICORT) 160-4.5  MCG/ACT inhaler Inhale 2 puffs into the lungs 2 (two) times daily. Via patient assistance, patient has paperwork 02/16/16  Yes Funches, Josalyn, MD  carvedilol (COREG) 3.125 MG tablet TAKE 1 TABLET BY MOUTH TWICE DAILY WITH A MEAL **OFFICE  VISIT  NEEDED  3RD  AND  FINAL  ATTEMPT** Patient taking differently: Take 3.125 mg by mouth 2 (two) times daily with a meal.  12/08/18  Yes Hilty, Nadean Corwin, MD  furosemide (LASIX) 40 MG tablet Take 1 tablet by mouth once daily 08/14/18  Yes Hilty, Nadean Corwin, MD  lisinopril (ZESTRIL) 5 MG tablet TAKE 1 TABLET BY MOUTH ONCE DAILY **OFFICE  VISIT  NEED**  3RD  AND  FINAL  ATTEMPT** Patient taking differently: Take 5 mg by mouth daily.  12/08/18  Yes Hilty, Nadean Corwin, MD  OMEPRAZOLE PO Take 1 capsule by mouth daily.   Yes [provider]  simvastatin (ZOCOR) 20 MG tablet TAKE 1 TABLET BY MOUTH ONCE DAILY AT  6  PM  **NEED  OFFICE  VISIT** Patient taking differently: Take 20 mg by mouth daily at 6 PM.  08/14/18  Yes Hilty, Nadean Corwin, MD  aspirin EC 81 MG tablet Take 1 tablet (81 mg total) by mouth daily. Patient not taking: Reported on 04/01/2017 02/16/16   Boykin Nearing, MD    Physical Exam: Vitals:   12/27/18 0045 12/27/18 0050 12/27/18 0109 12/27/18 0112  BP: 121/87 121/87  (!) 128/91  Pulse: 81 76  78  Resp: 18 19  18   Temp:    98 F (36.7 C)  TempSrc:    Oral  SpO2: 97% 97%  96%  Weight:   95.7 kg   Height:   5\' 6"  (1.676 m)     Constitutional: NAD, calm  Eyes: PERTLA, lids and conjunctivae normal ENMT: Mucous membranes are moist. Posterior pharynx clear of any exudate or lesions.   Neck: normal, supple, no masses, no thyromegaly Respiratory: no wheezing, no crackles. Normal respiratory effort. No accessory muscle use.  Cardiovascular: S1 & S2 heard, regular rate and rhythm. No extremity edema.   Abdomen: No distension, no tenderness, soft. Bowel sounds active.  Musculoskeletal: no clubbing / cyanosis. No joint deformity upper and lower  extremities.   Skin: no significant rashes, lesions, ulcers. Warm, dry, well-perfused. Neurologic: No facial asymmetry. Sensation intact. Moving all extremities.  Psychiatric: Alert and oriented to person, place, and situation. Calm, cooperative.    Labs on Admission: I have personally reviewed following labs and imaging studies  CBC: Recent Labs  Lab 12/26/18 2046  WBC 6.9  HGB 13.4  HCT 39.6  MCV 90.4  PLT [id number]   Basic Metabolic Panel: Recent Labs  Lab 12/26/18 2046  NA 135  K 4.2  CL 104  CO2 21*  GLUCOSE 143*  BUN 13  CREATININE 1.17*  CALCIUM 8.9   GFR: Estimated Creatinine Clearance: 58.9 mL/min (A) (by C-G formula based on SCr of 1.17 mg/dL (H)). Liver Function Tests: No results for input(s): AST, ALT, ALKPHOS,  BILITOT, PROT, ALBUMIN in the last 168 hours. No results for input(s): LIPASE, AMYLASE in the last 168 hours. No results for input(s): AMMONIA in the last 168 hours. Coagulation Profile: No results for input(s): INR, PROTIME in the last 168 hours. Cardiac Enzymes: Recent Labs  Lab 12/26/18 2117  CKTOTAL 63   BNP (last 3 results) No results for input(s): PROBNP in the last 8760 hours. HbA1C: No results for input(s): HGBA1C in the last 72 hours. CBG: No results for input(s): GLUCAP in the last 168 hours. Lipid Profile: No results for input(s): CHOL, HDL, LDLCALC, TRIG, CHOLHDL, LDLDIRECT in the last 72 hours. Thyroid Function Tests: No results for input(s): TSH, T4TOTAL, FREET4, T3FREE, THYROIDAB in the last 72 hours. Anemia Panel: No results for input(s): VITAMINB12, FOLATE, FERRITIN, TIBC, IRON, RETICCTPCT in the last 72 hours. Urine analysis: No results found for: COLORURINE, APPEARANCEUR, LABSPEC, PHURINE, GLUCOSEU, HGBUR, BILIRUBINUR, KETONESUR, PROTEINUR, UROBILINOGEN, NITRITE, LEUKOCYTESUR Sepsis Labs: @LABRCNTIP (procalcitonin:4,lacticidven:4) ) Recent Results (from the past 240 hour(s))  SARS CORONAVIRUS 2 (TAT 6-12 HRS) Nasal Swab  Aptima Multi Swab     Status: None   Collection Time: 12/26/18 10:10 PM   Specimen: Aptima Multi Swab; Nasal Swab  Result Value Ref Range Status   SARS Coronavirus 2 NEGATIVE NEGATIVE Final    Comment: (NOTE) SARS-CoV-2 target nucleic acids are NOT DETECTED. The SARS-CoV-2 RNA is generally detectable in upper and lower respiratory specimens during the acute phase of infection. Negative results do not preclude SARS-CoV-2 infection, do not rule out co-infections with other pathogens, and should not be used as the sole basis for treatment or other patient management decisions. Negative results must be combined with clinical observations, patient history, and epidemiological information. The expected result is Negative. Fact Sheet for Patients: SugarRoll.be Fact Sheet for Healthcare Providers: https://www.woods-mathews.com/ This test is not yet approved or cleared by the [place] FDA and  has been authorized for detection and/or diagnosis of SARS-CoV-2 by FDA under an Emergency Use Authorization (EUA). This EUA will remain  in effect (meaning this test can be used) for the duration of the COVID-19 declaration under Section 56 4(b)(1) of the Act, 21 U.S.C. section 360bbb-3(b)(1), unless the authorization is terminated or revoked sooner. Performed at [facility], [place] [address], [place], [place] [address]      Radiological Exams on Admission: Dg Chest 2 View  Result Date: 12/26/2018 CLINICAL DATA:  63 year old female with chest pain. EXAM: CHEST - 2 VIEW COMPARISON:  Chest radiograph dated 05/18/2016 FINDINGS: The lungs are clear. There is no pleural effusion or pneumothorax. The cardiac silhouette is within normal limits. Calcified mediastinal granuloma. Right nipple shadow. No acute osseous pathology. IMPRESSION: No active cardiopulmonary disease. Electronically Signed   By: Anner Crete M.D.   On: 12/26/2018 21:07    EKG:  Independently reviewed. Sinus rhythm, non-specific T-wave abnormality.   Assessment/Plan   1. NSTEMI - Presents with acute-onset chest pain, SOB, diaphoresis, and nausea, and is found to have non-specific T-wave abnormality on EKG, unremarkable CXR, initial troponin of 197 that increased to 1103 in less than 2 hrs  - Cardiology is consulting and recommending ASA 81, Coreg, lisinopril, continued cardiac enzyme assessment, echocardiogram, lipid panel, high-intensity statin, and IV heparin only if pain continues   2. COPD  - No cough or wheezing on admission  - Continue ICS/LABA and as-needed albuterol    3. Hypertension  - BP at goal, continue Coreg and lisinopril    4. CKD stage III  -  SCr is 1.17 on admission, similar to priors  - Renally-dose medications     PPE: Mask, face shield. Patient wearing mask.  DVT prophylaxis: Lovenox  Code Status: Full  Family Communication: Discussed with patient  Consults called: Cardiology  Admission status: Observation     Vianne Bulls, MD [facility] Pager [phone]  If 7PM-7AM, please contact night-coverage [email or web address] Password [facility]  12/27/2018, 3:42 AM

## 2018-12-27 NOTE — ED Notes (Signed)
ED TO INPATIENT HANDOFF REPORT  ED Nurse Name and Phone #: Mauri [place] Name/Age/Gender [name] 63 y.o. female Room/Bed: 021C/021C  Code Status   Code Status: Prior  Home/SNF/Other Dc home AO x4 I  Triage Complete: Triage complete  Chief Complaint CP  Triage Note Pt brought to ED by EMS from home for c/o left side cp with sob that started after she had an argument with family at home. BP 158/96, P-70, R 20, SPO2 98% RA.   Allergies No Known Allergies  Level of Care/Admitting Diagnosis ED Disposition    ED Disposition Condition [facility] Area: [place] [100100]  Level of Care: Progressive [102]  I expect the patient will be discharged within 24 hours: No (not a candidate for 5C-Observation unit)  Covid Evaluation: Asymptomatic Screening Protocol (No Symptoms)  Diagnosis: [facility]non-ST elevated [facility] infarction) [id number]  Admitting Physician: Vianne Bulls [id number]  Attending Physician: Vianne Bulls [id number]  PT Class (Do Not Modify): Observation [104]  PT Acc Code (Do Not Modify): Observation [10022]       B Medical/Surgery History Past Medical History:  Diagnosis Date  . Anemia   . Anxiety   . CHF (congestive heart failure) ([place])   . COPD (chronic obstructive pulmonary disease) ([place])   . Dyspnea   . Dysrhythmia   . GERD (gastroesophageal reflux disease)   . GI bleed 05/2016  . Hyperlipidemia   . Hypertension   . Myocardial infarction ([place])   . Nonischemic cardiomyopathy ([place])    a. stress-induced cardiomyopathy by cath 2010 (EF % at that time) - normal cors 2010 & 2012. b. 2013: echo showing EF of 50-55%. c. 05/2016: NSTEMI with cath showing normal cors and periapical akniesis --> consistent with Takotsubo Cardiomyopathy. EF down to 25-30%.  . Obesity   . PONV (postoperative nausea and vomiting)   . Takotsubo cardiomyopathy   . Thyroid disease   . Tobacco abuse    Past Surgical History:   Procedure Laterality Date  . BREAST LUMPECTOMY WITH RADIOACTIVE SEED LOCALIZATION Left 04/08/2017   Procedure: LEFT BREAST LUMPECTOMY WITH RADIOACTIVE SEED LOCALIZATION;  Surgeon: Alphonsa Overall, MD;  Location: [facility];  Service: General;  Laterality: Left;  . CARDIAC CATHETERIZATION  2010   no signficant CAD. EF 30%, apical ballooning, takotsubo cardiomyopathy  . CARDIAC CATHETERIZATION N/A 05/18/2016   Procedure: Left Heart Cath and Coronary Angiography;  Surgeon: Sherren Mocha, MD;  Location: [facility];  Service: Cardiovascular;  Laterality: N/A;  . TRANSTHORACIC ECHOCARDIOGRAM  05/2011   EF 50-55%     A IV Location/Drains/Wounds Patient Lines/Drains/Airways Status   Active Line/Drains/Airways    Name:   Placement date:   Placement time:   Site:   Days:   Post Cath / Sheath 05/18/16 Right Arterial;Radial   05/18/16    1736    Arterial;Radial   953   Incision (Closed) 04/08/17 Breast Left   04/08/17    1137     628          Intake/Output Last 24 hours No intake or output data in the 24 hours ending 12/27/18 0052  Labs/Imaging Results for orders placed or performed during the hospital encounter of 12/26/18 (from the past 48 hour(s))  Basic metabolic panel     Status: Abnormal   Collection Time: 12/26/18  8:46 PM  Result Value Ref Range   Sodium 135 135 - 145 mmol/L   Potassium 4.2 3.5 -  5.1 mmol/L   Chloride 104 98 - 111 mmol/L   CO2 21 (L) 22 - 32 mmol/L   Glucose, Bld 143 (H) 70 - 99 mg/dL   BUN 13 8 - 23 mg/dL   Creatinine, Ser 1.17 (H) 0.44 - 1.00 mg/dL   Calcium 8.9 8.9 - 10.3 mg/dL   GFR calc non Af Amer 50 (L) >60 mL/min   GFR calc Af Amer 58 (L) >60 mL/min   Anion gap 10 5 - 15    Comment: Performed at [facility] [address], [place] [address]  CBC     Status: None   Collection Time: 12/26/18  8:46 PM  Result Value Ref Range   WBC 6.9 4.0 - 10.5 K/uL   RBC 4.38 3.87 - 5.11 MIL/uL   Hemoglobin 13.4 12.0 - 15.0 g/dL   HCT 39.6 36.0 -  46.0 %   MCV 90.4 80.0 - 100.0 fL   MCH 30.6 26.0 - 34.0 pg   MCHC 33.8 30.0 - 36.0 g/dL   RDW 12.9 11.5 - 15.5 %   Platelets 231 150 - 400 K/uL   nRBC 0.0 0.0 - 0.2 %    Comment: Performed at [facility], [place] [address], [place], [place] [address]  Troponin I (High Sensitivity)     Status: Abnormal   Collection Time: 12/26/18  8:46 PM  Result Value Ref Range   Troponin I (High Sensitivity) 197 (HH) <18 ng/L    Comment: CRITICAL RESULT CALLED TO, READ BACK BY AND VERIFIED WITH: Yamileth Hayse,Y RN 12/26/2018 2135 JORDANS (NOTE) Elevated high sensitivity troponin I (hsTnI) values and significant  changes across serial measurements may suggest ACS but many other  chronic and acute conditions are known to elevate hsTnI results.  Refer to the Links section for chest pain algorithms and additional  guidance. Performed at [facility], [place] [address] [place], [place] [address]   Brain natriuretic peptide     Status: None   Collection Time: 12/26/18  9:17 PM  Result Value Ref Range   B Natriuretic Peptide 28.1 0.0 - 100.0 pg/mL    Comment: Performed at [facility] [address] [place], [place] [address]  CK     Status: None   Collection Time: 12/26/18  9:17 PM  Result Value Ref Range   Total CK 63 38 - 234 U/L    Comment: Performed at [facility], [place] [address] [place], [place] [address]  Troponin I (High Sensitivity)     Status: Abnormal   Collection Time: 12/26/18 10:05 PM  Result Value Ref Range   Troponin I (High Sensitivity) 1,103 (HH) <18 ng/L    Comment: CRITICAL RESULT CALLED TO, READ BACK BY AND VERIFIED WITH: Elira Colasanti,Y RN 12/26/2018 2325 JORDANS (NOTE) Elevated high sensitivity troponin I (hsTnI) values and significant  changes across serial measurements may suggest ACS but many other  chronic and acute conditions are known to elevate hsTnI results.  Refer to the Links section for chest pain algorithms and additional  guidance. Performed at  [facility], [place] [address], [place], [place] [address]    Dg Chest 2 View  Result Date: 12/26/2018 CLINICAL DATA:  63 year old female with chest pain. EXAM: CHEST - 2 VIEW COMPARISON:  Chest radiograph dated 05/18/2016 FINDINGS: The lungs are clear. There is no pleural effusion or pneumothorax. The cardiac silhouette is within normal limits. Calcified mediastinal granuloma. Right nipple shadow. No acute osseous  pathology. IMPRESSION: No active cardiopulmonary disease. Electronically Signed   By: Anner Crete M.D.   On: 12/26/2018 21:07    Pending Labs Unresulted Labs (From admission, onward)    Start     Ordered   12/26/18 2147  SARS CORONAVIRUS 2 (TAT 6-12 HRS) Nasal Swab Aptima Multi Swab  (Asymptomatic/Tier 2 Patients Labs)  Once,   STAT    Question Answer Comment  Is this test for diagnosis or screening Screening   Symptomatic for COVID-19 as defined by CDC No   Hospitalized for COVID-19 No   Admitted to ICU for COVID-19 No   Previously tested for COVID-19 No   Resident in a congregate (group) care setting No   Employed in healthcare setting No   Pregnant No      12/26/18 2146          Vitals/Pain Today's Vitals   12/26/18 2130 12/26/18 2145 12/26/18 2200 12/27/18 0050  BP: (!) 155/91 140/80 (!) 145/86 121/87  Pulse: 82 81 82 76  Resp: 15 15 16 19   Temp:      TempSrc:      SpO2: 98% 97% 98% 97%  Weight:      Height:      PainSc:        Isolation Precautions No active isolations  Medications Medications  LORazepam (ATIVAN) injection 1 mg (1 mg Intravenous Given 12/26/18 2203)    Mobility Self care Low fall risk   Focused Assessments AO x 4, SR, elevated trop, no cp at this time, no skin problems   R Recommendations: See Admitting Provider Note  Report given to:   Additional Notes:

## 2018-12-27 NOTE — Progress Notes (Signed)
  Echocardiogram 2D Echocardiogram has been performed.  [name] 12/27/2018, 10:15 AM

## 2018-12-27 NOTE — Progress Notes (Signed)
Progress Note  Patient Name: [name] Date of Encounter: 12/27/2018  Primary Cardiologist:  Pixie Casino, MD  Subjective   Still weak and with some DOE, but no chest pain (had chest fullness yesterday) and SOB much improved.  All episodes are sudden onset, this is the 5th time.  This episode not as severe as 2018 Precipitating factor was daughter's acute psychosis (she is at [facility])  Inpatient Medications    Scheduled Meds: . aspirin EC  81 mg Oral Daily  . atorvastatin  80 mg Oral q1800  . carvedilol  3.125 mg Oral BID WC  . enoxaparin (LOVENOX) injection  40 mg Subcutaneous Q24H  . lisinopril  5 mg Oral Daily  . mometasone-formoterol  2 puff Inhalation BID  . pantoprazole  40 mg Oral Daily   Continuous Infusions:  PRN Meds: acetaminophen, albuterol, nitroGLYCERIN, ondansetron (ZOFRAN) IV   Vital Signs    Vitals:   12/27/18 0109 12/27/18 0112 12/27/18 0630 12/27/18 0638  BP:  (!) 128/91 109/79   Pulse:  78 71   Resp:  18    Temp:  98 F (36.7 C) 97.9 F (36.6 C)   TempSrc:  Oral Oral   SpO2:  96% 99%   Weight: 95.7 kg   94.4 kg  Height: 5\' 6"  (1.676 m)       Intake/Output Summary (Last 24 hours) at 12/27/2018 0803 Last data filed at 12/27/2018 0120 Gross per 24 hour  Intake -  Output 300 ml  Net -300 ml   Filed Weights   12/26/18 2044 12/27/18 0109 12/27/18 0638  Weight: 93.9 kg 95.7 kg 94.4 kg   Last Weight  Most recent update: 12/27/2018  6:38 AM   Weight  94.4 kg (208 lb 3.2 oz)           Weight change:    Telemetry    SR, occ PACs - Personally Reviewed  ECG    08/26, SR, HR 71, no acute changes, diffuse anterior T wave flattening slightly different from 07/14/2017 - Personally Reviewed  Physical Exam   General: Well developed, well nourished, female appearing in no acute distress. Head: Normocephalic, atraumatic.  Neck: Supple without bruits, JVD not elevated. Lungs:  Resp regular and unlabored, CTA. Heart: RRR, S1, S2, no  S3, S4, or murmur; no rub. Abdomen: Soft, non-tender, non-distended with normoactive bowel sounds. No hepatomegaly. No rebound/guarding. No obvious abdominal masses. Extremities: No clubbing, cyanosis, no edema. Distal pedal pulses are 2+ bilaterally. Neuro: Alert and oriented X 3. Moves all extremities spontaneously. Psych: Normal affect.  Labs    Hematology Recent Labs  Lab 12/26/18 2046 12/27/18 0448  WBC 6.9 6.9  RBC 4.38 4.31  HGB 13.4 13.0  HCT 39.6 39.8  MCV 90.4 92.3  MCH 30.6 30.2  MCHC 33.8 32.7  RDW 12.9 13.0  PLT 231 242    Chemistry Recent Labs  Lab 12/26/18 2046 12/27/18 0448  NA 135 139  K 4.2 4.2  CL 104 108  CO2 21* 22  GLUCOSE 143* 100*  BUN 13 11  CREATININE 1.17* 1.14*  CALCIUM 8.9 9.2  PROT  --  6.2*  ALBUMIN  --  3.6  AST  --  22  ALT  --  11  ALKPHOS  --  84  BILITOT  --  0.7  GFRNONAA 50* 52*  GFRAA 58* >60  ANIONGAP 10 9     High Sensitivity Troponin:   Recent Labs  Lab 12/26/18 2046 12/26/18 2205 12/27/18  0319 12/27/18 0448  TROPONINIHS 197* 1,103* 2,067* 2,185*      BNP Recent Labs  Lab 12/26/18 2117  BNP 28.1     Radiology    Dg Chest 2 View  Result Date: 12/26/2018 CLINICAL DATA:  63 year old female with chest pain. EXAM: CHEST - 2 VIEW COMPARISON:  Chest radiograph dated 05/18/2016 FINDINGS: The lungs are clear. There is no pleural effusion or pneumothorax. The cardiac silhouette is within normal limits. Calcified mediastinal granuloma. Right nipple shadow. No acute osseous pathology. IMPRESSION: No active cardiopulmonary disease. Electronically Signed   By: Anner Crete M.D.   On: 12/26/2018 21:07     Cardiac Studies   ECHO:  Ordered  ECHO: 11/26/2016 - Left ventricle: The cavity size was mildly dilated. Systolic   function was normal. The estimated ejection fraction was in the   range of 55% to 60%. Wall motion was normal; there were no   regional wall motion abnormalities. Left ventricular diastolic    function parameters were normal  Patient Profile     63 y.o. female w/ hx Takotsubo CM 2010 w/ EF 30%>>NSTEMI w/ CHF admit 02/2011 w/ nl cors (no echo or LV gram)>>EF 50-55% in 2013>>25% 05/2016 w/ nl cors at cath (known precipitating event) w/ peak trop 4.08>>55-60% 10/2016, low BP limits med titration, HTN, HLD, GERD, COPD, thyroid dz.   Assessment & Plan    1. NSTEMI  - elevated ez but not as high as previous - hx cath x 3, all clean, last in 2018 - sx have improved, seem more like CHF than anginal CP - she is NPO, discuss w/ MD if cath needed  2. Hx Takotsubo CM - EF has been down to 25-30% w/ episodes, but recovers - echo ordered, contacted them, they will do next  3. CKD III - Per IM - GFR is in the 50s  Otherwise, per IM Principal Problem:   NSTEMI (non-ST elevated myocardial infarction) [facility]) Active Problems:   Essential hypertension   COPD (chronic obstructive pulmonary disease) (HCC)   CKD (chronic kidney disease), stage III (HCC)    Signed, Rosaria Ferries , PA-C 8:03 AM 12/27/2018 Pager: [phone]

## 2018-12-27 NOTE — Discharge Summary (Signed)
Physician Discharge Summary  [name] [id number] DOB: January 02, 1956 DOA: 12/26/2018  PCP: Ladell Pier, MD  Admit date: 12/26/2018 Discharge date: 12/27/2018  Recommendations for Outpatient Follow-up:  1. Follow up with PCP in 7-10 days 2. Follow up with cardiology as directed. 3. Chemistry to be drawn in one week and results forwarded to PCP.  Discharge Diagnoses: Principal diagnosis is #1 1. NSTEMI  2. Takotsubo cardiomyopathy 3. CKD III 4. Essential hypertension 5. COPD  Discharge Condition: Fair Disposition: Home  Diet recommendation: Heart healthy  Filed Weights   12/26/18 2044 12/27/18 0109 12/27/18 [id number]  Weight: 93.9 kg 95.7 kg 94.4 kg    History of present illness: [name] is a 63 y.o. female with medical history significant for COPD, hypertension, hyperlipidemia, chronic kidney disease stage III, and history of stress-induced cardiomyopathy, now presenting to the emergency department for evaluation of acute onset chest pain and shortness of breath.  Patient reports that she had been in her usual state of health until she got into an argument with family and then developed acute onset of left-sided chest pain, shortness of breath, nausea, and diaphoresis.  She describes the symptoms as very similar to her prior episodes where she was found to have normal coronary arteries with periapical akinesis and apical ballooning.  She had severe LV dysfunction following that had resolved by time of transthoracic echo in July 2018.  Patient denies any recent fevers, chills, increased cough, or sick contacts.    ED Course: Upon arrival to the ED, patient is found to be afebrile, saturating well on room air, and with remaining vitals also normal.  EKG features a sinus rhythm with nonspecific T wave abnormalities.  Chest x-ray is negative for acute cardiopulmonary disease.  Chemistry panel is notable for creatinine 1.17, similar to priors.  CBC is unremarkable.  BNP is  normal.  CK is normal.  COVID-19 is negative.  Troponin is elevated to 197, then increased further to 1103 and less than 2 hours.  Cardiology was consulted by the ED physician and admission to the hospitalist service was recommended.  Hospital Course:  The patient was admitted to a telemetry bed. Cardiology was consulted. Echocardiogram was obtained and demonstrated EF 45-50% with mild hypokinesis of the basal to mid-antero/infero septum, and diastolic dysfunction. Coronary CT-A was performed and was negative for ischemia. Cardiology stated that the patient was cleared for discharged. It is felt that the patient again has takotsubo cardiomyopathy. She has been diagnosed with this in the past and has had clean coronary arteries on both occasions.  Today's assessment: S: The patient is resting comfortably. No acute distress. O: Vitals:  Vitals:   12/27/18 1226 12/27/18 1256  BP: 122/77 107/78  Pulse:  60  Resp:  16  Temp:  98.4 F (36.9 C)  SpO2:  96%    Constitutional:  . The patient is awake, alert, and oriented x 3. No acute distress. Respiratory:  . CTA bilaterally, no w/r/r.  . Respiratory effort normal. No retractions or accessory muscle use Cardiovascular:  . RRR, no m/r/g . No LE extremity edema   . Normal pedal pulses Abdomen:  . Abdomen appears normal; no tenderness or masses . No hernias . No HSM Musculoskeletal:  . No cyanosis, clubbing, or edema. Skin:  . No rashes, lesions, ulcers . palpation of skin: no induration or nodules Neurologic:  . CN 2-12 intact . Sensation all 4 extremities intact Psychiatric:  . judgement and insight appear normal . Mental status  o Mood, affect appropriate o Orientation to person, place, time   Discharge Instructions  Discharge Instructions    (HEART FAILURE PATIENTS) Call MD:  Anytime you have any of the following symptoms: 1) 3 pound weight gain in 24 hours or 5 pounds in 1 week 2) shortness of breath, with or without a dry  hacking cough 3) swelling in the hands, feet or stomach 4) if you have to sleep on extra pillows at night in order to breathe.   Complete by: As directed    Call MD for:  difficulty breathing, headache or visual disturbances   Complete by: As directed    Call MD for:  extreme fatigue   Complete by: As directed    Call MD for:  severe uncontrolled pain   Complete by: As directed    Diet - low sodium heart healthy   Complete by: As directed    Discharge instructions   Complete by: As directed    Follow up with PCP in 7-10 days. Follow up with cardiology as directed. BMP to be drawn in one week, and to be reported to PCP.   Heart Failure patients record your daily weight using the same scale at the same time of day   Complete by: As directed    Increase activity slowly   Complete by: As directed    STOP any activity that causes chest pain, shortness of breath, dizziness, sweating, or exessive weakness   Complete by: As directed      Allergies as of 12/27/2018   No Known Allergies     Medication List    STOP taking these medications   simvastatin 20 MG tablet Commonly known as: ZOCOR     TAKE these medications   albuterol 108 (90 Base) MCG/ACT inhaler Commonly known as: VENTOLIN HFA Inhale 2 puffs into the lungs every 4 (four) hours as needed for wheezing or shortness of breath.   aspirin EC 81 MG tablet Take 1 tablet (81 mg total) by mouth daily.   atorvastatin 80 MG tablet Commonly known as: LIPITOR Take 1 tablet (80 mg total) by mouth daily at 6 PM.   budesonide-formoterol 160-4.5 MCG/ACT inhaler Commonly known as: SYMBICORT Inhale 2 puffs into the lungs 2 (two) times daily. Via patient assistance, patient has paperwork   carvedilol 3.125 MG tablet Commonly known as: COREG Take 1 tablet (3.125 mg total) by mouth 2 (two) times daily with a meal. What changed: See the new instructions.   furosemide 40 MG tablet Commonly known as: LASIX Take 1 tablet by mouth once  daily   lisinopril 5 MG tablet Commonly known as: ZESTRIL TAKE 1 TABLET BY MOUTH ONCE DAILY **OFFICE  VISIT  NEED**  3RD  AND  FINAL  ATTEMPT** What changed: See the new instructions.   OMEPRAZOLE PO Take 1 capsule by mouth daily.      No Known Allergies  The results of significant diagnostics from this hospitalization (including imaging, microbiology, ancillary and laboratory) are listed below for reference.    Significant Diagnostic Studies: Dg Chest 2 View  Result Date: 12/26/2018 CLINICAL DATA:  63 year old female with chest pain. EXAM: CHEST - 2 VIEW COMPARISON:  Chest radiograph dated 05/18/2016 FINDINGS: The lungs are clear. There is no pleural effusion or pneumothorax. The cardiac silhouette is within normal limits. Calcified mediastinal granuloma. Right nipple shadow. No acute osseous pathology. IMPRESSION: No active cardiopulmonary disease. Electronically Signed   By: Anner Crete M.D.   On: 12/26/2018 21:07  Ct Coronary Morph W/cta Cor W/score W/ca W/cm &/or Wo/cm  Addendum Date: 12/27/2018   ADDENDUM REPORT: 12/27/2018 16:24 CLINICAL DATA:  64F with recurrent Takotsubo cardiomyopathy, hypertension, hyperlipidemia, and obesity here with NSTEMI. EXAM: Cardiac/Coronary  CT TECHNIQUE: The patient was scanned on a [employer]. FINDINGS: A 120 kV prospective scan was triggered in the descending thoracic aorta at 111 HU's. Axial non-contrast 3 mm slices were carried out through the heart. The data set was analyzed on a dedicated work station and scored using the [place]. Gantry rotation speed was 250 msecs and collimation was .6 mm. No beta blockade and 0.8 mg of sl NTG was given. The 3D data set was reconstructed in 5% intervals of the 67-82 % of the R-R cycle. Diastolic phases were analyzed on a dedicated work station using MPR, MIP and VRT modes. The patient received 80 cc of contrast. Aorta: Normal size.  2.9 cm.  No calcifications.  No dissection. Aortic Valve:   Trileaflet.  No calcifications. Coronary Arteries:  Normal coronary origin.  Right dominance. RCA is a large dominant artery that gives rise to PDA and 3 PLV branches. There is no plaque. Left main is a large artery that gives rise to LAD and LCX arteries. LAD is a large vessel that has no plaque. LCX is a small, non-dominant artery that gives rise to two small OM branches. There is no plaque. Other findings: Normal pulmonary vein drainage into the left atrium. Normal let atrial appendage without a thrombus. Normal size of the pulmonary artery. IMPRESSION: 1. Coronary calcium score of 0. This was 0 percentile for age and sex matched control. 2. Normal coronary origin with right dominance. 3. No evidence of CAD. Skeet Latch, MD Electronically Signed   By: Skeet Latch   On: 12/27/2018 16:24   Result Date: 12/27/2018 EXAM: OVER-READ INTERPRETATION  CT CHEST The following report is an over-read performed by radiologist Dr. Abigail Miyamoto of [facility] [place] on 12/27/2018. This over-read does not include interpretation of cardiac or coronary anatomy or pathology. The coronary CTA interpretation by the cardiologist is attached. COMPARISON:  Chest radiograph 12/26/2018.  CTA chest 03/16/2011. FINDINGS: Vascular: Aortic atherosclerosis. No central pulmonary embolism, on this non-dedicated study. Mediastinum/Nodes: Calcified mediastinal loads. Lungs/Pleura: No pleural fluid.  Bibasilar scarring. Lingular volume loss. Upper Abdomen: Normal imaged portions of the liver, spleen, stomach. Musculoskeletal: No acute osseous abnormality. IMPRESSION: 1.  No acute findings in the imaged extracardiac chest. 2. Calcified mediastinal nodes may relate to old granulomatous. 3.  Aortic Atherosclerosis (ICD10-I70.0). Electronically Signed: By: Abigail Miyamoto M.D. On: 12/27/2018 14:12    Microbiology: Recent Results (from the past 240 hour(s))  SARS CORONAVIRUS 2 (TAT 6-12 HRS) Nasal Swab Aptima Multi Swab     Status:  None   Collection Time: 12/26/18 10:10 PM   Specimen: Aptima Multi Swab; Nasal Swab  Result Value Ref Range Status   SARS Coronavirus 2 NEGATIVE NEGATIVE Final    Comment: (NOTE) SARS-CoV-2 target nucleic acids are NOT DETECTED. The SARS-CoV-2 RNA is generally detectable in upper and lower respiratory specimens during the acute phase of infection. Negative results do not preclude SARS-CoV-2 infection, do not rule out co-infections with other pathogens, and should not be used as the sole basis for treatment or other patient management decisions. Negative results must be combined with clinical observations, patient history, and epidemiological information. The expected result is Negative. Fact Sheet for Patients: SugarRoll.be Fact Sheet for Healthcare Providers: https://www.woods-mathews.com/ This test is not yet  approved or cleared by the [place] and  has been authorized for detection and/or diagnosis of SARS-CoV-2 by FDA under an Emergency Use Authorization (EUA). This EUA will remain  in effect (meaning this test can be used) for the duration of the COVID-19 declaration under Section 56 4(b)(1) of the Act, 21 U.S.C. section 360bbb-3(b)(1), unless the authorization is terminated or revoked sooner. Performed at [facility], [place] [address], [place], [place] [address]      Labs: Basic Metabolic Panel: Recent Labs  Lab 12/26/18 2046 12/27/18 0448  NA 135 139  K 4.2 4.2  CL 104 108  CO2 21* 22  GLUCOSE 143* 100*  BUN 13 11  CREATININE 1.17* 1.14*  CALCIUM 8.9 9.2   Liver Function Tests: Recent Labs  Lab 12/27/18 0448  AST 22  ALT 11  ALKPHOS 84  BILITOT 0.7  PROT 6.2*  ALBUMIN 3.6   No results for input(s): LIPASE, AMYLASE in the last 168 hours. No results for input(s): AMMONIA in the last 168 hours. CBC: Recent Labs  Lab 12/26/18 2046 12/27/18 0448  WBC 6.9 6.9  HGB 13.4 13.0  HCT 39.6 39.8  MCV  90.4 92.3  PLT 231 242   Cardiac Enzymes: Recent Labs  Lab 12/26/18 2117  CKTOTAL 63   BNP: BNP (last 3 results) Recent Labs    12/26/18 2117  BNP 28.1    ProBNP (last 3 results) No results for input(s): PROBNP in the last 8760 hours.  CBG: No results for input(s): GLUCAP in the last 168 hours.  Principal Problem:   NSTEMI (non-ST elevated myocardial infarction) ([place]) Active Problems:   Stress-induced cardiomyopathy   Essential hypertension   COPD (chronic obstructive pulmonary disease) ([facility])   CKD (chronic kidney disease), stage III ([place])   Time coordinating discharge: 38 minutes.  Signed:        Janan Bogie, DO [facility]  12/27/2018, 6:23 PM

## 2018-12-27 NOTE — Progress Notes (Signed)
CRITICAL VALUE ALERT  Critical Value:  Troponin - 2185  Date & Time Notied:  12/27/2018 [id number]  Provider Notified: Tylene Fantasia  Orders Received/Actions taken: per protocol

## 2019-02-09 ENCOUNTER — Telehealth: Payer: Self-pay | Admitting: Internal Medicine

## 2019-02-09 ENCOUNTER — Other Ambulatory Visit: Payer: Self-pay | Admitting: Internal Medicine

## 2019-02-09 NOTE — Telephone Encounter (Signed)
Please see note below. 

## 2019-02-09 NOTE — Telephone Encounter (Signed)
Pt overdue for 12 month f/u. °Please contact pt for future appointment. °

## 2019-02-09 NOTE — Telephone Encounter (Signed)
LVM for patient to call and schedule followup with Dr. Hilty. °

## 2019-02-12 ENCOUNTER — Other Ambulatory Visit: Payer: Self-pay | Admitting: Internal Medicine

## 2019-02-16 ENCOUNTER — Other Ambulatory Visit: Payer: Self-pay | Admitting: Internal Medicine

## 2019-02-16 MED ORDER — ATORVASTATIN CALCIUM 80 MG PO TABS: 80.0000 mg | ORAL_TABLET | Freq: Every day | ORAL | 1 refills | Status: DC

## 2019-02-16 MED ORDER — LISINOPRIL 5 MG PO TABS: ORAL_TABLET | ORAL | 1 refills | Status: DC

## 2019-02-16 MED ORDER — CARVEDILOL 3.125 MG PO TABS: 3.1250 mg | ORAL_TABLET | Freq: Two times a day (BID) | ORAL | 1 refills | Status: DC

## 2019-02-16 NOTE — Telephone Encounter (Signed)
Refill

## 2019-02-19 ENCOUNTER — Other Ambulatory Visit: Payer: Self-pay

## 2019-02-19 MED ORDER — LISINOPRIL 5 MG PO TABS: ORAL_TABLET | ORAL | 0 refills | Status: DC

## 2019-03-21 ENCOUNTER — Ambulatory Visit: Payer: No Typology Code available for payment source | Admitting: Internal Medicine

## 2019-04-24 ENCOUNTER — Other Ambulatory Visit: Payer: Self-pay | Admitting: Internal Medicine

## 2019-05-01 ENCOUNTER — Telehealth (INDEPENDENT_AMBULATORY_CARE_PROVIDER_SITE_OTHER): Payer: No Typology Code available for payment source | Admitting: Internal Medicine

## 2019-05-01 ENCOUNTER — Encounter: Payer: Self-pay | Admitting: Internal Medicine

## 2019-05-01 ENCOUNTER — Telehealth: Payer: Self-pay | Admitting: Internal Medicine

## 2019-05-01 VITALS — BP 110/62 | Ht 66.0 in | Wt 208.4 lb

## 2019-05-01 DIAGNOSIS — I10 Essential (primary) hypertension: Secondary | ICD-10-CM

## 2019-05-01 DIAGNOSIS — E782 Mixed hyperlipidemia: Secondary | ICD-10-CM

## 2019-05-01 DIAGNOSIS — I5181 Takotsubo syndrome: Secondary | ICD-10-CM

## 2019-05-01 MED ORDER — CARVEDILOL 3.125 MG PO TABS: ORAL_TABLET | ORAL | 3 refills | Status: DC

## 2019-05-01 MED ORDER — LISINOPRIL 5 MG PO TABS: ORAL_TABLET | ORAL | 3 refills | Status: DC

## 2019-05-01 MED ORDER — FUROSEMIDE 40 MG PO TABS: 40.0000 mg | ORAL_TABLET | Freq: Every day | ORAL | 3 refills | Status: DC

## 2019-05-01 MED ORDER — ATORVASTATIN CALCIUM 80 MG PO TABS: 80.0000 mg | ORAL_TABLET | Freq: Every day | ORAL | 3 refills | Status: DC

## 2019-05-01 NOTE — Progress Notes (Signed)
Virtual Visit via Telephone Note   This visit type was conducted due to national recommendations for restrictions regarding the COVID-19 Pandemic (e.g. social distancing) in an effort to limit this patient's exposure and mitigate transmission in our community.  Due to her co-morbid illnesses, this patient is at least at moderate risk for complications without adequate follow up.  This format is felt to be most appropriate for this patient at this time.  The patient did not have access to video technology/had technical difficulties with video requiring transitioning to audio format only (telephone).  All issues noted in this document were discussed and addressed.  No physical exam could be performed with this format.  Please refer to the patient's chart for her  consent to telehealth for [facility].   Evaluation Performed:  Telephone visit  Date:  05/01/2019   ID:  [name] 19-Jan-1956, MRN [id number]  Patient Location:  [address] [place] [address]  Provider location:   [address], [address] [place], [place] [address]  PCP:  Ladell Pier, MD  Cardiologist:  Pixie Casino, MD Electrophysiologist:  None   Chief Complaint:  Hospital follow-up  History of Present Illness:    [name] is a 63 y.o. female who presents via audio/video conferencing for a telehealth visit today.  [name] is a 63 year old female I been following with a history of Takotsubo cardiomyopathy with EF that improved as of 2018.  Cath showed normal coronaries at that time.  She presented again with elevated troponin in August after a stressful event and was found to have elevated troponin with an echo that showed an EF 45 to 50% with some mild hypokinesis toward the apex.  A CTA was performed which was negative for coronary disease again.  Subsequently her medications were adjusted and she was released to follow-up.  She says she overall feels well.  The patient  does not have symptoms concerning for COVID-19 infection (fever, chills, cough, or new SHORTNESS OF BREATH).    Prior CV studies:   The following studies were reviewed today:  Hospital course, discharge summary, echo, CT angiogram  PMHx:  Past Medical History:  Diagnosis Date  . Anemia   . Anxiety   . CHF (congestive heart failure) ([place])   . COPD (chronic obstructive pulmonary disease) ([place])   . Dyspnea   . Dysrhythmia   . GERD (gastroesophageal reflux disease)   . GI bleed 05/2016  . Hyperlipidemia   . Hypertension   . Myocardial infarction ([place])   . Nonischemic cardiomyopathy ([place])    a. stress-induced cardiomyopathy by cath 2010 (EF % at that time) - normal cors 2010 & 2012. b. 2013: echo showing EF of 50-55%. c. 05/2016: NSTEMI with cath showing normal cors and periapical akniesis --> consistent with Takotsubo Cardiomyopathy. EF down to 25-30%.  . Obesity   . PONV (postoperative nausea and vomiting)   . Takotsubo cardiomyopathy   . Thyroid disease   . Tobacco abuse     Past Surgical History:  Procedure Laterality Date  . BREAST LUMPECTOMY WITH RADIOACTIVE SEED LOCALIZATION Left 04/08/2017   Procedure: LEFT BREAST LUMPECTOMY WITH RADIOACTIVE SEED LOCALIZATION;  Surgeon: Alphonsa Overall, MD;  Location: [facility];  Service: General;  Laterality: Left;  . CARDIAC CATHETERIZATION  2010   no signficant CAD. EF 30%, apical ballooning, takotsubo cardiomyopathy  . CARDIAC CATHETERIZATION N/A 05/18/2016   Procedure: Left Heart Cath and Coronary Angiography;  Surgeon: Sherren Mocha,  MD;  Location: [facility];  Service: Cardiovascular;  Laterality: N/A;  . TRANSTHORACIC ECHOCARDIOGRAM  05/2011   EF 50-55%    FAMHx:  Family History  Problem Relation Age of Onset  . Lung cancer Mother        died in August 02, 2003  . COPD Father   . Heart disease Brother        CABG at age 42  . Breast cancer Maternal Grandmother     SOCHx:   reports that she quit smoking about 16 years ago. She  has never used smokeless tobacco. She reports that she does not drink alcohol or use drugs.  ALLERGIES:  No Known Allergies  MEDS:  Current Meds  Medication Sig  . albuterol (PROVENTIL HFA;VENTOLIN HFA) 108 (90 Base) MCG/ACT inhaler Inhale 2 puffs into the lungs every 4 (four) hours as needed for wheezing or shortness of breath.  Marland Kitchen aspirin EC 81 MG tablet Take 1 tablet (81 mg total) by mouth daily.  . budesonide-formoterol (SYMBICORT) 160-4.5 MCG/ACT inhaler Inhale 2 puffs into the lungs 2 (two) times daily. Via patient assistance, patient has paperwork  . OMEPRAZOLE PO Take 1 capsule by mouth daily.  . [DISCONTINUED] atorvastatin (LIPITOR) 80 MG tablet Take 1 tablet (80 mg total) by mouth daily at 6 PM.  . [DISCONTINUED] carvedilol (COREG) 3.125 MG tablet TAKE 1 TABLET BY MOUTH TWICE DAILY WITH A MEAL  . [DISCONTINUED] furosemide (LASIX) 40 MG tablet Take 1 tablet by mouth once daily  . [DISCONTINUED] lisinopril (ZESTRIL) 5 MG tablet TAKE 1 TABLET BY MOUTH ONCE DAILY **OFFICE  VISIT  NEEDED**  3RD  AND  FINAL  ATTEMPT**  . [DISCONTINUED] lisinopril (ZESTRIL) 5 MG tablet TAKE 1 TABLET BY MOUTH ONCE DAILY     ROS: Pertinent items noted in HPI and remainder of comprehensive ROS otherwise negative.  Labs/Other Tests and Data Reviewed:    Recent Labs: 12/26/2018: B Natriuretic Peptide 28.1 12/27/2018: ALT 11; BUN 11; Creatinine, Ser 1.14; Hemoglobin 13.0; Platelets 242; Potassium 4.2; Sodium 139   Recent Lipid Panel Lab Results  Component Value Date/Time   CHOL 165 12/27/2018 04:48 AM   TRIG 62 12/27/2018 04:48 AM   HDL 42 12/27/2018 04:48 AM   CHOLHDL 3.9 12/27/2018 04:48 AM   LDLCALC 111 (H) 12/27/2018 04:48 AM    Wt Readings from [facility] 3 Encounters:  05/01/19 208 lb 6.4 oz (94.5 kg)  12/27/18 208 lb 3.2 oz (94.4 kg)  07/14/17 208 lb (94.3 kg)     Exam:    Vital Signs:  BP 110/62   Ht 5\' 6"  (1.676 m)   Wt 208 lb 6.4 oz (94.5 kg)   BMI 33.64 kg/m    Exam deferred due to  telephone visit  ASSESSMENT & PLAN:    1. Stress-induced cardiomyopathy, recurrent 2. LVEF 45 to 50% 3. No significant coronary disease by CT coronary angiography, history of "normal" coronaries by cath in the past as well  Ms. Huckeba had recurrent stress-induced cardiomyopathy.  She has been compliant with her medications.  She has NYHA class I symptoms at this point with no significant coronary disease.  We will continue medical therapy.  We will need to consider repeat echocardiography with a limited test to see if EF has improved.  Plan otherwise follow-up with me annually or sooner as necessary.  COVID-19 Education: The signs and symptoms of COVID-19 were discussed with the patient and how to seek care for testing (follow up with PCP or arrange E-visit).  The importance of social distancing was discussed today.  Patient Risk:   After full review of this patients clinical status, I feel that they are at least moderate risk at this time.  Time:   Today, I have spent 25 minutes with the patient with telehealth technology discussing stress-induced cardiomyopathy.     Medication Adjustments/Labs and Tests Ordered: Current medicines are reviewed at length with the patient today.  Concerns regarding medicines are outlined above.   Tests Ordered: Orders Placed This Encounter  Procedures  . ECHOCARDIOGRAM LIMITED    Medication Changes: No orders of the defined types were placed in this encounter.   Disposition:  in 1 year(s)  Pixie Casino, MD, [facility], [place] Director of the Advanced Lipid Disorders &  Cardiovascular Risk Reduction Clinic Diplomate of the American Board of Clinical Lipidology Attending Cardiologist  Direct Dial: [phone]  Fax: [phone]  Website:  [email or web address]  Pixie Casino, MD  05/01/2019 4:50 PM

## 2019-05-01 NOTE — Telephone Encounter (Signed)
Left message for patient to call and schedule Limited Echo ordered by Dr. Gena Fray 05/01/19 staff message

## 2019-05-01 NOTE — Patient Instructions (Signed)
Medication Instructions:  No changes *If you need a refill on your cardiac medications before your next appointment, please call your pharmacy*  Lab Work: None ordered If you have labs (blood work) drawn today and your tests are completely normal, you will receive your results only by: Marland Kitchen MyChart Message (if you have MyChart) OR . A paper copy in the mail If you have any lab test that is abnormal or we need to change your treatment, we will call you to review the results.  Testing/Procedures: Your physician has requested that you have an limited echocardiogram. Echocardiography is a painless test that uses sound waves to create images of your heart. It provides your doctor with information about the size and shape of your heart and how well your heart's chambers and valves are working. You may receive an ultrasound enhancing agent through an IV if needed to better visualize your heart during the echo.This procedure takes approximately one hour. There are no restrictions for this procedure. This will take place at the [address].    Follow-Up: At [facility], you and your health needs are our priority.  As part of our continuing mission to provide you with exceptional heart care, we have created designated Provider Care Teams.  These Care Teams include your primary Cardiologist (physician) and Advanced Practice Providers (APPs -  Physician Assistants and Nurse Practitioners) who all work together to provide you with the care you need, when you need it.  Your next appointment:   12 month(s)  The format for your next appointment:   In Person  Provider:   You may see Pixie Casino, MD or one of the following Advanced Practice Providers on your designated Care Team:    Almyra Deforest, PA-C  Fabian Sharp, PA-C or   Roby Lofts, [place]

## 2019-05-07 NOTE — Telephone Encounter (Signed)
Left message for patient to call and schedule limited Echo ordered by Dr. Debara Pickett

## 2019-05-15 ENCOUNTER — Encounter (HOSPITAL_COMMUNITY): Payer: Self-pay | Admitting: Radiology

## 2019-05-22 ENCOUNTER — Telehealth (HOSPITAL_COMMUNITY): Payer: Self-pay | Admitting: Radiology

## 2019-05-22 NOTE — Telephone Encounter (Signed)
Just an FYI. We have made several attempts to contact this patient including sending a letter to schedule or reschedule their echocardiogram. We will be removing the patient from the echo WQ.  1.12.21 LMOM-mailed [email or web address]  evd  1.5.21 @942  unable to [facility] evd  12/30@952am  LMOM evd  05/07/19 @ 12:14pm lm for patient to call and   Thank you

## 2019-09-18 IMAGING — MG BREAST SURGICAL SPECIMEN
1 series · 1 of 1 positions shown · non-contrast
Comparison: Previous exam(s).

CLINICAL DATA: Surgical excision of a fibroadenoma associated with
atypical ductal hyperplasia involving the upper outer quadrant of
the left breast. Radioactive seed localization was performed
yesterday.

EXAM:
SPECIMEN RADIOGRAPH OF THE LEFT BREAST

[L]
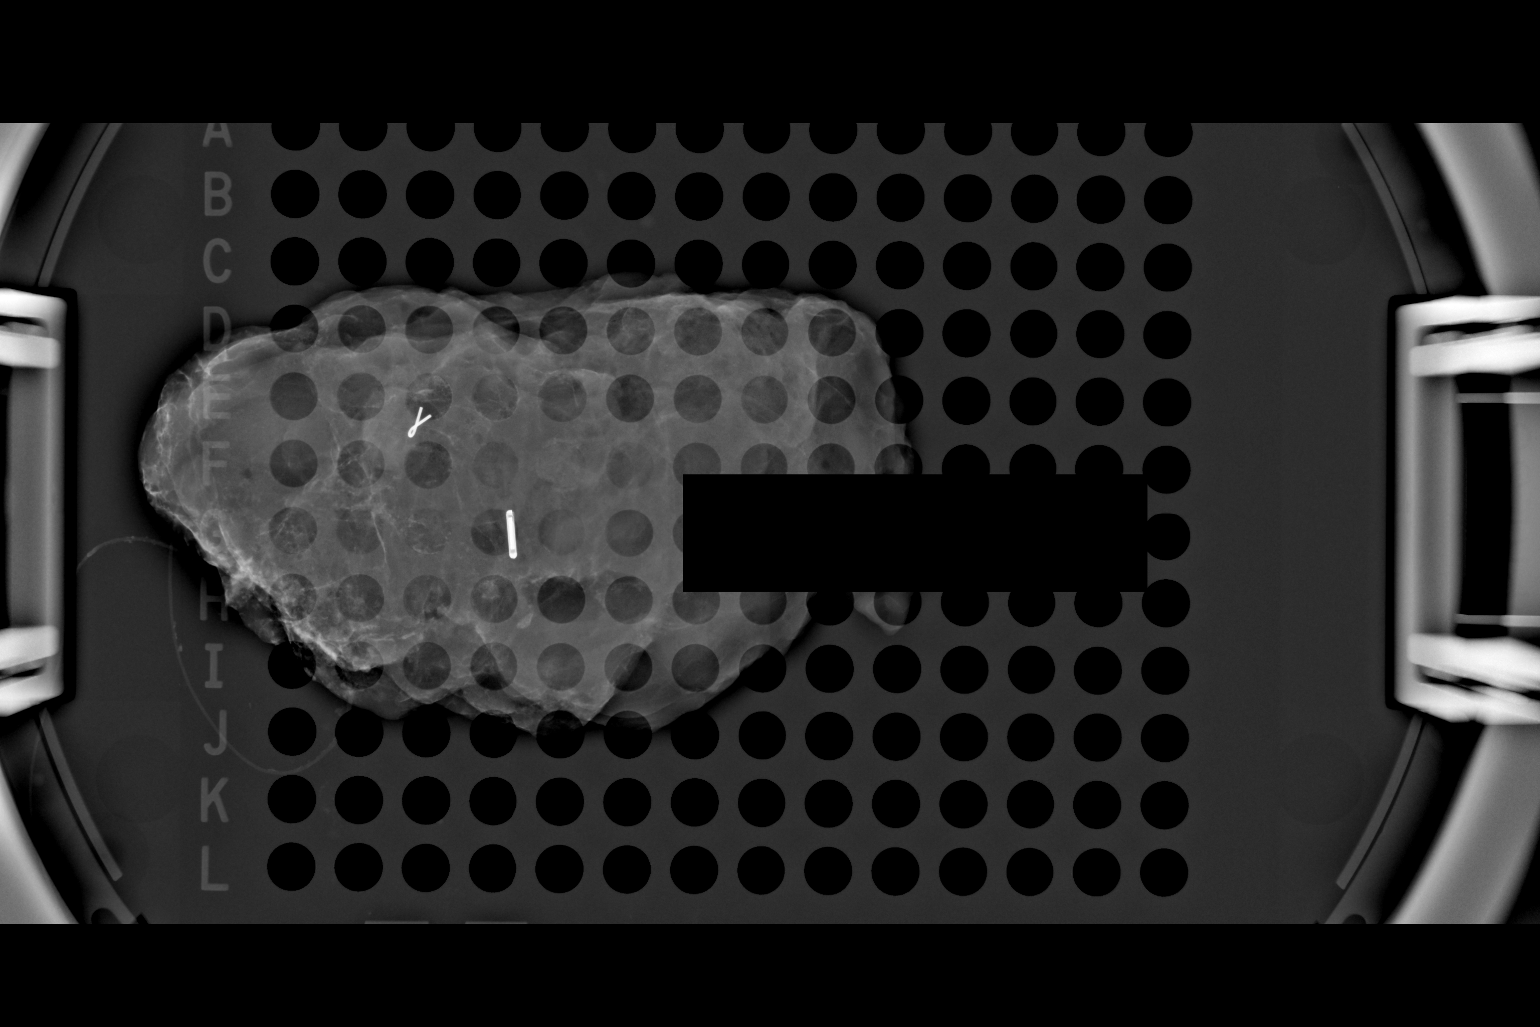

[1 of 1 positions shown; findings below may reference images not displayed]

FINDINGS: Status post excision of the left breast. The radioactive seed, the
ribbon shaped tissue marker clip, and the mass are present,
completely intact, and are marked for pathology. This was discussed
directly with the operating room nurse at the time of interpretation
IMPRESSION: Specimen radiograph of the left breast.

## 2020-05-14 ENCOUNTER — Telehealth: Payer: Self-pay

## 2020-05-14 ENCOUNTER — Telehealth (INDEPENDENT_AMBULATORY_CARE_PROVIDER_SITE_OTHER): Payer: Self-pay | Admitting: Internal Medicine

## 2020-05-14 ENCOUNTER — Encounter: Payer: Self-pay | Admitting: Internal Medicine

## 2020-05-14 VITALS — BP 114/79 | HR 62 | Ht 66.0 in | Wt 202.0 lb

## 2020-05-14 DIAGNOSIS — I5181 Takotsubo syndrome: Secondary | ICD-10-CM

## 2020-05-14 DIAGNOSIS — I10 Essential (primary) hypertension: Secondary | ICD-10-CM

## 2020-05-14 DIAGNOSIS — E782 Mixed hyperlipidemia: Secondary | ICD-10-CM

## 2020-05-14 DIAGNOSIS — I428 Other cardiomyopathies: Secondary | ICD-10-CM

## 2020-05-14 NOTE — Patient Instructions (Signed)
Medication Instructions:  No Changes In Medications at this time.  *If you need a refill on your cardiac medications before your next appointment, please call your pharmacy*  Lab Work: LIPID PANEL- IN 4 MONTHS- WHEN ECHO IS DONE  If you have labs (blood work) drawn today and your tests are completely normal, you will receive your results only by: Marland Kitchen MyChart Message (if you have MyChart) OR . A paper copy in the mail If you have any lab test that is abnormal or we need to change your treatment, we will call you to review the results.  Testing/Procedures: Your physician has requested that you have an echocardiogram in 4-5 months. Echocardiography is a painless test that uses sound waves to create images of your heart. It provides your doctor with information about the size and shape of your heart and how well your heart's chambers and valves are working. You may receive an ultrasound enhancing agent through an IV if needed to better visualize your heart during the echo.This procedure takes approximately one hour. There are no restrictions for this procedure. This will take place at the [address].   Follow-Up: At [facility], you and your health needs are our priority.  As part of our continuing mission to provide you with exceptional heart care, we have created designated Provider Care Teams.  These Care Teams include your primary Cardiologist (physician) and Advanced Practice Providers (APPs -  Physician Assistants and Nurse Practitioners) who all work together to provide you with the care you need, when you need it.  Your next appointment:   6 months   The format for your next appointment:   In Person  Provider:   K. Mali Hilty, MD

## 2020-05-14 NOTE — Progress Notes (Signed)
Virtual Visit via Telephone Note   This visit type was conducted due to national recommendations for restrictions regarding the COVID-19 Pandemic (e.g. social distancing) in an effort to limit this patient's exposure and mitigate transmission in our community.  Due to her co-morbid illnesses, this patient is at least at moderate risk for complications without adequate follow up.  This format is felt to be most appropriate for this patient at this time.  The patient did not have access to video technology/had technical difficulties with video requiring transitioning to audio format only (telephone).  All issues noted in this document were discussed and addressed.  No physical exam could be performed with this format.  Please refer to the patient's chart for her  consent to telehealth for [facility].   Evaluation Performed:  Telephone visit  Date:  05/14/2020   ID:  [name], DOB 1955/07/09, MRN [id number]  Patient Location:  [address] [place] [address]  Provider location:   [address] [place] [address] [place],  [address]  PCP:  Ladell Pier, MD  Cardiologist:  Pixie Casino, MD Electrophysiologist:  None   Chief Complaint:  Hospital follow-up  History of Present Illness:    [name] is a 65 y.o. female who presents via audio/video conferencing for a telehealth visit today.  [name] is a 65 year old female I been following with a history of Takotsubo cardiomyopathy with EF that improved as of 2018.  Cath showed normal coronaries at that time.  She presented again with elevated troponin in August after a stressful event and was found to have elevated troponin with an echo that showed an EF 45 to 50% with some mild hypokinesis toward the apex.  A CTA was performed which was negative for coronary disease again.  Subsequently her medications were adjusted and she was released to follow-up.  She says she overall feels  well.  05/14/2020  [name] returns today for follow-up.  Overall she says she is feeling well.  She denies any chest pain or shortness of breath.  Her last echo in 2020 did showed some mild hypokinesis with mildly reduced LVEF.  I recommended a repeat limited echo to see if medical therapy was going to improve this however it was never performed due to the [place] pandemic.  I still feel that she needs a study however she is reluctant to do it at this time.  She is on adequate heart failure therapies however they may need to be adjusted or switched if her EF is not improving.  She was also noted last year to have a climbing LDL cholesterol despite therapy with atorvastatin.  This will also need to be reassessed and perhaps adjustments made to diet, activity or medications.  The patient does not have symptoms concerning for COVID-19 infection (fever, chills, cough, or new SHORTNESS OF BREATH).    Prior CV studies:   The following studies were reviewed today:  Hospital course, discharge summary, echo, CT angiogram  PMHx:  Past Medical History:  Diagnosis Date  . Anemia   . Anxiety   . CHF (congestive heart failure) ([place])   . COPD (chronic obstructive pulmonary disease) ([place])   . Dyspnea   . Dysrhythmia   . GERD (gastroesophageal reflux disease)   . GI bleed 05/2016  . Hyperlipidemia   . Hypertension   . Myocardial infarction ([place])   . Nonischemic cardiomyopathy ([place])    a. stress-induced cardiomyopathy by cath 2010 (EF %  at that time) - normal cors 08/13/2008 & 08-14-2010. b13-Apr-2013: echo showing EF of 50-55%. c. 05/2016: NSTEMI with cath showing normal cors and periapical akniesis --> consistent with Takotsubo Cardiomyopathy. EF down to 25-30%.  . Obesity   . PONV (postoperative nausea and vomiting)   . Takotsubo cardiomyopathy   . Thyroid disease   . Tobacco abuse     Past Surgical History:  Procedure Laterality Date  . BREAST LUMPECTOMY WITH RADIOACTIVE SEED LOCALIZATION Left 04/08/2017    Procedure: LEFT BREAST LUMPECTOMY WITH RADIOACTIVE SEED LOCALIZATION;  Surgeon: Alphonsa Overall, MD;  Location: [facility];  Service: General;  Laterality: Left;  . CARDIAC CATHETERIZATION  08-13-2008   no signficant CAD. EF 30%, apical ballooning, takotsubo cardiomyopathy  . CARDIAC CATHETERIZATION N/A 05/18/2016   Procedure: Left Heart Cath and Coronary Angiography;  Surgeon: Sherren Mocha, MD;  Location: [facility];  Service: Cardiovascular;  Laterality: N/A;  . TRANSTHORACIC ECHOCARDIOGRAM  05/2011   EF 50-55%    FAMHx:  Family History  Problem Relation Age of Onset  . Lung cancer Mother        died in 2003-08-14  . COPD Father   . Heart disease Brother        CABG at age 28  . Breast cancer Maternal Grandmother     SOCHx:   reports that she quit smoking about 17 years ago. She has never used smokeless tobacco. She reports that she does not drink alcohol and does not use drugs.  ALLERGIES:  No Known Allergies  MEDS:  Current Meds  Medication Sig  . albuterol (PROVENTIL HFA;VENTOLIN HFA) 108 (90 Base) MCG/ACT inhaler Inhale 2 puffs into the lungs every 4 (four) hours as needed for wheezing or shortness of breath.  Marland Kitchen aspirin EC 81 MG tablet Take 1 tablet (81 mg total) by mouth daily.  Marland Kitchen atorvastatin (LIPITOR) 80 MG tablet Take 1 tablet (80 mg total) by mouth daily at 6 PM.  . budesonide-formoterol (SYMBICORT) 160-4.5 MCG/ACT inhaler Inhale 2 puffs into the lungs 2 (two) times daily. Via patient assistance, patient has paperwork  . carvedilol (COREG) 3.125 MG tablet TAKE 1 TABLET BY MOUTH TWICE DAILY WITH A MEAL  . furosemide (LASIX) 40 MG tablet Take 1 tablet (40 mg total) by mouth daily.  Marland Kitchen lisinopril (ZESTRIL) 5 MG tablet TAKE 1 TABLET BY MOUTH ONCE DAILY  . OMEPRAZOLE PO Take 1 capsule by mouth daily.     ROS: Pertinent items noted in HPI and remainder of comprehensive ROS otherwise negative.  Labs/Other Tests and Data Reviewed:    Recent Labs: No results found for requested  labs within last 8760 hours.   Recent Lipid Panel Lab Results  Component Value Date/Time   CHOL 165 12/27/2018 04:48 AM   TRIG 62 12/27/2018 04:48 AM   HDL 42 12/27/2018 04:48 AM   CHOLHDL 3.9 12/27/2018 04:48 AM   LDLCALC 111 (H) 12/27/2018 04:48 AM    Wt Readings from Last 3 Encounters:  05/14/20 202 lb (91.6 kg)  05/01/19 208 lb 6.4 oz (94.5 kg)  12/27/18 208 lb 3.2 oz (94.4 kg)     Exam:    Vital Signs:  BP 114/79   Pulse 62   Ht 5\' 6"  (1.676 m)   Wt 202 lb (91.6 kg)   BMI 32.60 kg/m    Exam deferred due to telephone visit  ASSESSMENT & PLAN:    1. Stress-induced cardiomyopathy, recurrent 2. LVEF 45 to 50% 3. No significant coronary disease by  CT coronary angiography, history of "normal" coronaries by cath in the past as well  Ms. Bechler does have mildly reduced LVEF related to a stress-induced cardiomyopathy.  She is on adequate heart failure medications but has not had a limited repeat echo.  She would be agreeable to this in late spring or early summer hopefully if the COVID pandemic dies down.  We will plan a repeat lipid at that time and follow-up with me afterwards.  COVID-19 Education: The signs and symptoms of COVID-19 were discussed with the patient and how to seek care for testing (follow up with PCP or arrange E-visit).  The importance of social distancing was discussed today.  Patient Risk:   After full review of this patients clinical status, I feel that they are at least moderate risk at this time.  Time:   Today, I have spent 25 minutes with the patient with telehealth technology discussing stress-induced cardiomyopathy.     Medication Adjustments/Labs and Tests Ordered: Current medicines are reviewed at length with the patient today.  Concerns regarding medicines are outlined above.   Tests Ordered: No orders of the defined types were placed in this encounter.   Medication Changes: No orders of the defined types were placed in this  encounter.   Disposition:  in 6 month(s)  Pixie Casino, MD, [facility], [place] Director of the Advanced Lipid Disorders &  Cardiovascular Risk Reduction Clinic Diplomate of the American Board of Clinical Lipidology Attending Cardiologist  Direct Dial: [phone]  Fax: [phone]  Website:  [email or web address]  Pixie Casino, MD  05/14/2020 10:55 AM

## 2020-05-14 NOTE — Telephone Encounter (Signed)
Spoke with patient to go over instructions after virtual visit with Dr. Debara Pickett today.   Per Dr. Debara Pickett- patient needs Limited Echo in 4-5 months with repeat lipid panel at that time. Follow up visit needed in 6 months.   Orders have been placed   Recall Put in for 6 month follow up   Message sent to scheduling to get Limited Echo Scheduled.   Patient made aware and verbalized understanding.

## 2020-05-15 MED ORDER — LISINOPRIL 5 MG PO TABS: ORAL_TABLET | ORAL | 3 refills | Status: DC

## 2020-05-15 MED ORDER — FUROSEMIDE 40 MG PO TABS: 40.0000 mg | ORAL_TABLET | Freq: Every day | ORAL | 3 refills | Status: DC

## 2020-05-15 MED ORDER — CARVEDILOL 3.125 MG PO TABS: ORAL_TABLET | ORAL | 3 refills | Status: DC

## 2020-05-15 MED ORDER — ATORVASTATIN CALCIUM 80 MG PO TABS: 80.0000 mg | ORAL_TABLET | Freq: Every day | ORAL | 3 refills | Status: DC

## 2020-07-11 ENCOUNTER — Encounter (HOSPITAL_COMMUNITY): Payer: Self-pay | Admitting: Internal Medicine

## 2020-07-25 ENCOUNTER — Telehealth (HOSPITAL_COMMUNITY): Payer: Self-pay | Admitting: Internal Medicine

## 2020-07-25 NOTE — Telephone Encounter (Signed)
Just an FYI. We have made several attempts to contact this patient including sending a letter to schedule or reschedule their echocardiogram. We will be removing the patient from the echo [place].   07/11/20 MAILED LETTER [facility]  07/08/20 called and [facility] to schedule @ 9:44am/[facility]  07/03/20 Called and Number unavailable at this time@ 8:45/LBW  07/01/20 [facility] to schedule @ 11:36/LBW       Thank you

## 2021-06-07 IMAGING — CT CT HEAR MORPH WITH CTA COR WITH SCORE WITH CA WITH CONTRAST AND
4 of 7 series · 8 of 20 positions shown, 9 images · IV contrast (APPLIED)
Comparison: Chest radiograph 12/26/2018.  CTA chest 03/16/2011.
COMPARISON: Chest radiograph 12/26/2018.  CTA chest 03/16/2011.

Addendum:
EXAM:
OVER-READ INTERPRETATION  CT CHEST

The following report is an over-read performed by radiologist Dr.
Calen Sanon [REDACTED] on 12/27/2018. This over-read
does not include interpretation of cardiac or coronary anatomy or
pathology. The coronary CTA interpretation by the cardiologist is
attached.
CLINICAL DATA: 62F with recurrent Takotsubo cardiomyopathy,
hypertension, hyperlipidemia, and obesity here with NSTEMI.
Cardiac/Coronary  CT
TECHNIQUE: The patient was scanned on a Phillips Force scanner.

[Series 6: best diast 68 % · axial · 0.39mm/px · z∈[-94,-54]mm · 2 of 308 slices shown, 3 images]
[im 103/308  vessel]
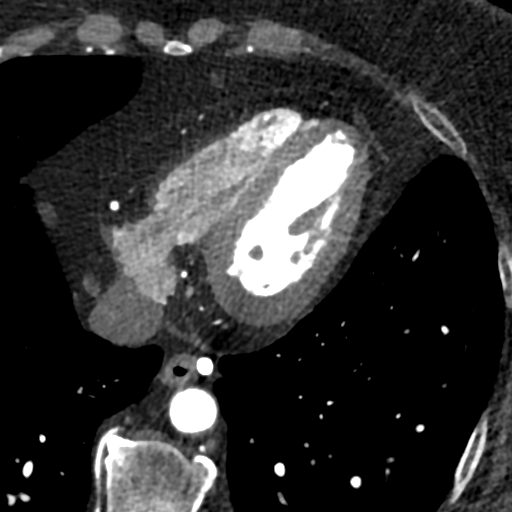
[im 103/308  lung]
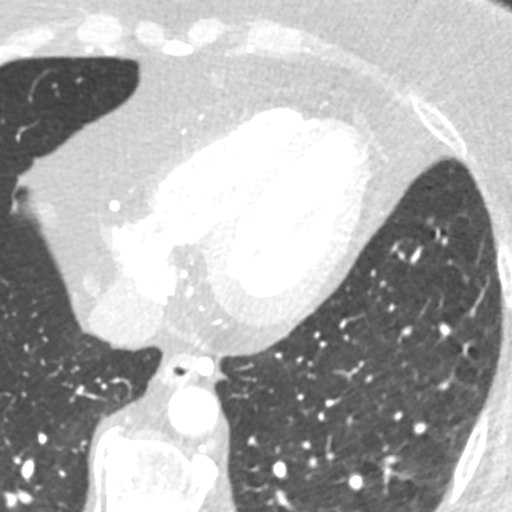
[im 205/308  vessel]
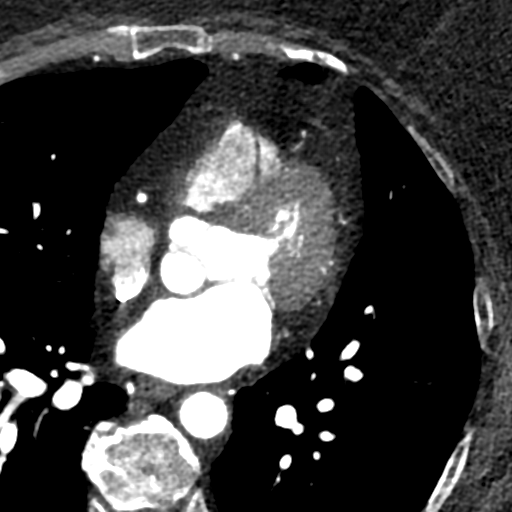

[Series 7: best syst 68 % · axial · 0.39mm/px · z∈[-94,-54]mm · 2 of 308 slices shown]
[im 103/308  vessel]
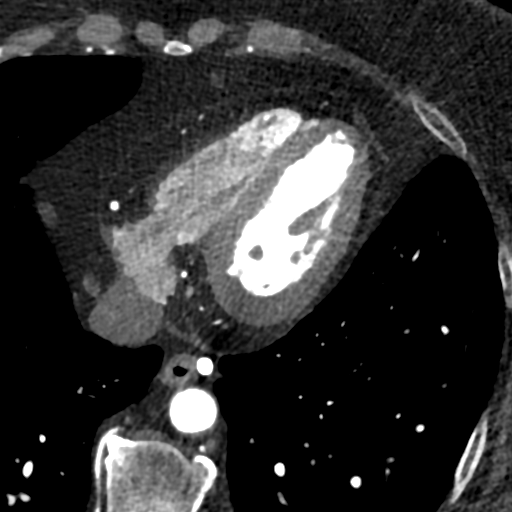
[im 205/308  vessel]
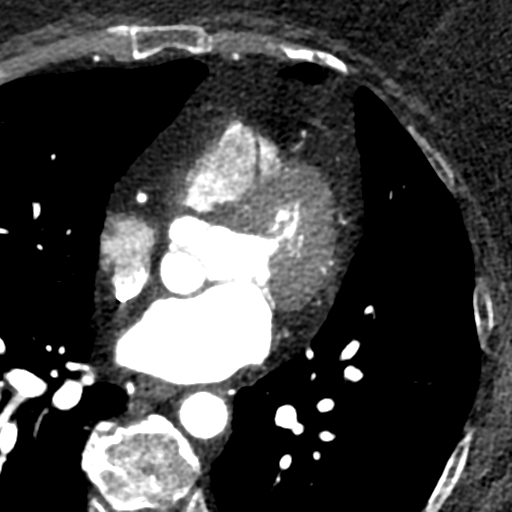

[Series 8: ts diast sharp 68 % · axial · 0.39mm/px · z∈[-94,-54]mm · 2 of 308 slices shown]
[im 103/308  lung]
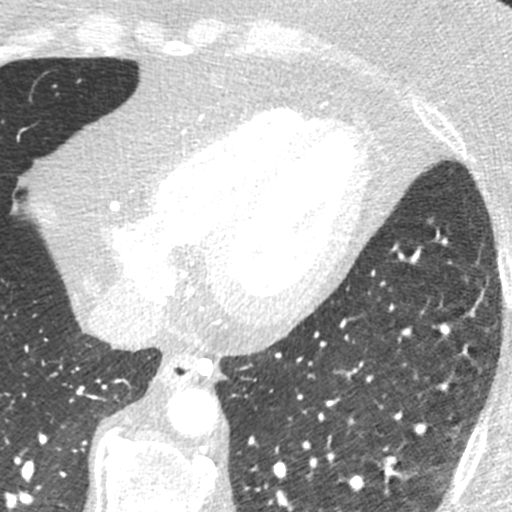
[im 205/308  lung]
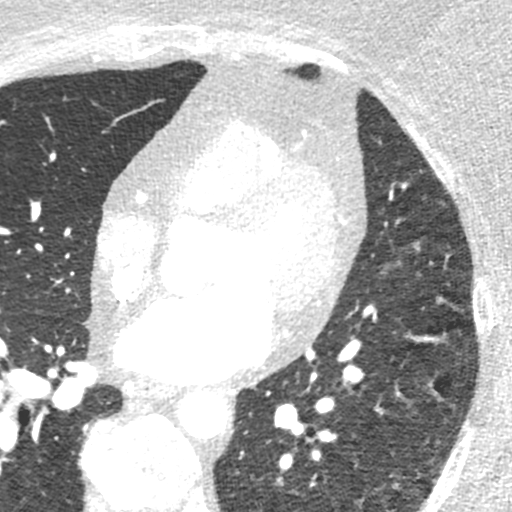

[Series 9: ts syst sharp 68 % · axial · 0.39mm/px · z∈[-94,-54]mm · 2 of 308 slices shown]
[im 103/308  lung]
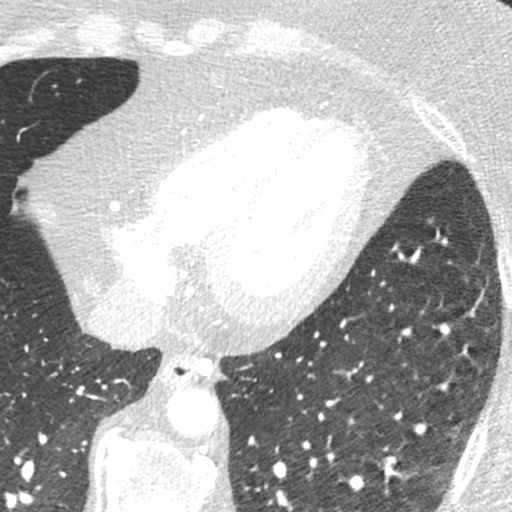
[im 205/308  lung]
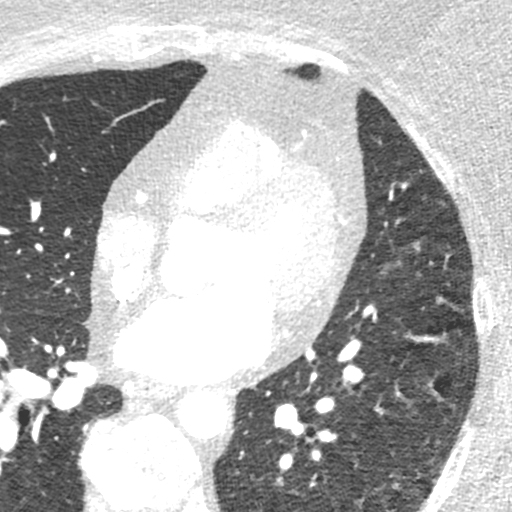

[8 of 20 positions shown; findings below may reference images not displayed]

FINDINGS: Vascular: Aortic atherosclerosis. No central pulmonary embolism, on
this non-dedicated study.

Mediastinum/Nodes: Calcified mediastinal loads.

Lungs/Pleura: No pleural fluid.  Bibasilar scarring.

Lingular volume loss.

Upper Abdomen: Normal imaged portions of the liver, spleen, stomach.

Musculoskeletal: No acute osseous abnormality.
IMPRESSION: 1.  No acute findings in the imaged extracardiac chest.
2. Calcified mediastinal nodes may relate to old granulomatous.
3.  Aortic Atherosclerosis (1EFKR-ECN.N).
FINDINGS: A 120 kV prospective scan was triggered in the descending thoracic
aorta at 111 HU's. Axial non-contrast 3 mm slices were carried out
through the heart. The data set was analyzed on a dedicated work
station and scored using the Agatson method. Gantry rotation speed
was 250 msecs and collimation was .6 mm. No beta blockade and 0.8 mg
of sl NTG was given. The 3D data set was reconstructed in 5%
intervals of the 67-82 % of the R-R cycle. Diastolic phases were
analyzed on a dedicated work station using MPR, MIP and VRT modes.
The patient received 80 cc of contrast.

Aorta: Normal size.  2.9 cm.  No calcifications.  No dissection.

Aortic Valve:  Trileaflet.  No calcifications.

Coronary Arteries:  Normal coronary origin.  Right dominance.

RCA is a large dominant artery that gives rise to PDA and 3 TAN
branches. There is no plaque.

Left main is a large artery that gives rise to LAD and LCX arteries.

LAD is a large vessel that has no plaque.

LCX is a small, non-dominant artery that gives rise to two small OM
branches. There is no plaque.

Other findings:

Normal pulmonary vein drainage into the left atrium.

Normal let atrial appendage without a thrombus.

Normal size of the pulmonary artery.
IMPRESSION: 1. Coronary calcium score of 0. This was 0 percentile for age and
sex matched control.

2. Normal coronary origin with right dominance.

3. No evidence of CAD.

*** End of Addendum ***
EXAM:
OVER-READ INTERPRETATION  CT CHEST

The following report is an over-read performed by radiologist Dr.
Calen Sanon [REDACTED] on 12/27/2018. This over-read
does not include interpretation of cardiac or coronary anatomy or
pathology. The coronary CTA interpretation by the cardiologist is
attached.
FINDINGS: Vascular: Aortic atherosclerosis. No central pulmonary embolism, on
this non-dedicated study.

Mediastinum/Nodes: Calcified mediastinal loads.

Lungs/Pleura: No pleural fluid.  Bibasilar scarring.

Lingular volume loss.

Upper Abdomen: Normal imaged portions of the liver, spleen, stomach.

Musculoskeletal: No acute osseous abnormality.
IMPRESSION: 1.  No acute findings in the imaged extracardiac chest.
2. Calcified mediastinal nodes may relate to old granulomatous.
3.  Aortic Atherosclerosis (1EFKR-ECN.N).

## 2021-06-13 ENCOUNTER — Other Ambulatory Visit: Payer: Self-pay | Admitting: Internal Medicine

## 2021-07-13 ENCOUNTER — Other Ambulatory Visit: Payer: Self-pay | Admitting: Internal Medicine

## 2021-07-21 ENCOUNTER — Telehealth: Payer: Self-pay | Admitting: Physician Assistant

## 2021-07-21 DIAGNOSIS — J069 Acute upper respiratory infection, unspecified: Secondary | ICD-10-CM

## 2021-07-21 MED ORDER — BENZONATATE 100 MG PO CAPS: 100.0000 mg | ORAL_CAPSULE | Freq: Three times a day (TID) | ORAL | 0 refills | Status: DC | PRN

## 2021-07-21 MED ORDER — IPRATROPIUM BROMIDE 0.03 % NA SOLN: 2.0000 | Freq: Two times a day (BID) | NASAL | 0 refills | Status: DC

## 2021-07-21 NOTE — Progress Notes (Signed)

## 2021-08-07 ENCOUNTER — Other Ambulatory Visit: Payer: Self-pay | Admitting: Internal Medicine

## 2021-08-17 ENCOUNTER — Encounter: Payer: Self-pay | Admitting: Internal Medicine

## 2021-08-21 MED ORDER — LISINOPRIL 5 MG PO TABS: ORAL_TABLET | ORAL | 0 refills | Status: DC

## 2021-08-21 MED ORDER — CARVEDILOL 3.125 MG PO TABS: ORAL_TABLET | ORAL | 0 refills | Status: DC

## 2021-08-21 MED ORDER — ATORVASTATIN CALCIUM 80 MG PO TABS: 80.0000 mg | ORAL_TABLET | Freq: Every day | ORAL | 0 refills | Status: DC

## 2021-09-23 ENCOUNTER — Encounter: Payer: Self-pay | Admitting: Internal Medicine

## 2021-09-23 MED ORDER — CARVEDILOL 3.125 MG PO TABS
ORAL_TABLET | ORAL | 0 refills | Status: DC
Start: 2021-09-23 — End: 2021-10-16

## 2021-10-15 ENCOUNTER — Other Ambulatory Visit: Payer: Self-pay | Admitting: Internal Medicine

## 2021-10-21 ENCOUNTER — Encounter: Payer: Self-pay | Admitting: Internal Medicine

## 2021-10-21 ENCOUNTER — Telehealth (INDEPENDENT_AMBULATORY_CARE_PROVIDER_SITE_OTHER): Payer: Self-pay | Admitting: Internal Medicine

## 2021-10-21 VITALS — BP 122/78 | HR 60 | Ht 66.0 in | Wt 205.0 lb

## 2021-10-21 DIAGNOSIS — I428 Other cardiomyopathies: Secondary | ICD-10-CM

## 2021-10-21 DIAGNOSIS — I10 Essential (primary) hypertension: Secondary | ICD-10-CM

## 2021-10-21 DIAGNOSIS — I5181 Takotsubo syndrome: Secondary | ICD-10-CM

## 2021-10-21 DIAGNOSIS — E782 Mixed hyperlipidemia: Secondary | ICD-10-CM

## 2021-10-21 MED ORDER — ATORVASTATIN CALCIUM 80 MG PO TABS: 80.0000 mg | ORAL_TABLET | Freq: Every day | ORAL | 3 refills | Status: DC

## 2021-10-21 MED ORDER — FUROSEMIDE 40 MG PO TABS: 40.0000 mg | ORAL_TABLET | Freq: Every day | ORAL | 3 refills | Status: DC

## 2021-10-21 MED ORDER — CARVEDILOL 3.125 MG PO TABS: 3.1250 mg | ORAL_TABLET | Freq: Two times a day (BID) | ORAL | 1 refills | Status: DC

## 2021-10-21 MED ORDER — LISINOPRIL 5 MG PO TABS: ORAL_TABLET | ORAL | 3 refills | Status: DC

## 2021-10-21 NOTE — Progress Notes (Signed)
Virtual Visit via Video Note   This visit type was conducted due to national recommendations for restrictions regarding the COVID-19 Pandemic (e.g. social distancing) in an effort to limit this patient's exposure and mitigate transmission in our community.  Due to her co-morbid illnesses, this patient is at least at moderate risk for complications without adequate follow up.  This format is felt to be most appropriate for this patient at this time.  The patient does have access to video technology.  All issues noted in this document were discussed and addressed.  A limited physical exam could be performed with this format.  Please refer to the patient's chart for her  consent to telehealth for [facility].   Evaluation Performed:  Video visit  Date:  10/21/2021   ID:  Renee, Wood [birth year], MRN [id number]  Patient Location:  [address] [place] [address]  Provider location:   [address], [place] [address] [place], [place] [address]  PCP:  Ladell Pier, MD  Cardiologist:  Pixie Casino, MD Electrophysiologist:  None   Chief Complaint:  Hospital follow-up  History of Present Illness:    [name] is a 66 y.o. female who presents via audio/video conferencing for a telehealth visit today.  [name] is a 66 year old female I been following with a history of Takotsubo cardiomyopathy with EF that improved as of 2018.  Cath showed normal coronaries at that time.  She presented again with elevated troponin in August after a stressful event and was found to have elevated troponin with an echo that showed an EF 45 to 50% with some mild hypokinesis toward the apex.  A CTA was performed which was negative for coronary disease again.  Subsequently her medications were adjusted and she was released to follow-up.  She says she overall feels well.  05/14/2020  [name] returns today for follow-up.  Overall she says she is feeling well.  She denies any chest  pain or shortness of breath.  Her last echo in 2020 did showed some mild hypokinesis with mildly reduced LVEF.  I recommended a repeat limited echo to see if medical therapy was going to improve this however it was never performed due to the [place] pandemic.  I still feel that she needs a study however she is reluctant to do it at this time.  She is on adequate heart failure therapies however they may need to be adjusted or switched if her EF is not improving.  She was also noted last year to have a climbing LDL cholesterol despite therapy with atorvastatin.  This will also need to be reassessed and perhaps adjustments made to diet, activity or medications.  10/21/2021  [name] is seen today for video follow-up.  She was supposed to have an echo prior to the visit but this was not performed.  She says she feels well.  She tries to take her Lasix 4 to 5 days a week but gets cramping in her legs.  This may be because her LVEF has recovered however were not able to get an echo yet to assess this.  Also have not been able to seen her in the office to reexamine her.  She does need prescriptions for medications.  The patient does not have symptoms concerning for COVID-19 infection (fever, chills, cough, or new SHORTNESS OF BREATH).    Prior CV studies:   The following studies were reviewed today:  Hospital course, discharge summary, echo,  CT angiogram  PMHx:  Past Medical History:  Diagnosis Date   Anemia    Anxiety    CHF (congestive heart failure) ([facility])    COPD (chronic obstructive pulmonary disease) ([facility])    Dyspnea    Dysrhythmia    GERD (gastroesophageal reflux disease)    GI bleed 05/2016   Hyperlipidemia    Hypertension    Myocardial infarction ([place])    Nonischemic cardiomyopathy ([place])    a. stress-induced cardiomyopathy by cath 08-09-08 (EF % at that time) - normal cors August 09, 2008 & 2010-08-10. b. 2011/08/10: echo showing EF of 50-55%. c. 05/2016: NSTEMI with cath showing normal cors and periapical  akniesis --> consistent with Takotsubo Cardiomyopathy. EF down to 25-30%.   Obesity    PONV (postoperative nausea and vomiting)    Takotsubo cardiomyopathy    Thyroid disease    Tobacco abuse     Past Surgical History:  Procedure Laterality Date   BREAST LUMPECTOMY WITH RADIOACTIVE SEED LOCALIZATION Left 04/08/2017   Procedure: LEFT BREAST LUMPECTOMY WITH RADIOACTIVE SEED LOCALIZATION;  Surgeon: Alphonsa Overall, MD;  Location: [facility];  Service: General;  Laterality: Left;   CARDIAC CATHETERIZATION  09-Aug-2008   no signficant CAD. EF 30%, apical ballooning, takotsubo cardiomyopathy   CARDIAC CATHETERIZATION N/A 05/18/2016   Procedure: Left Heart Cath and Coronary Angiography;  Surgeon: Sherren Mocha, MD;  Location: [facility];  Service: Cardiovascular;  Laterality: N/A;   TRANSTHORACIC ECHOCARDIOGRAM  05/2011   EF 50-55%    FAMHx:  Family History  Problem Relation Age of Onset   Lung cancer Mother        died in Aug 10, 2003   COPD Father    Heart disease Brother        CABG at age 96   Breast cancer Maternal Grandmother     SOCHx:   reports that she quit smoking about 18 years ago. She has never used smokeless tobacco. She reports that she does not drink alcohol and does not use drugs.  ALLERGIES:  No Known Allergies  MEDS:  Current Meds  Medication Sig   atorvastatin (LIPITOR) 80 MG tablet Take 1 tablet (80 mg total) by mouth daily. PT  NEEDS  TO  SCHEDULE  AN  OFFICE  VISIT  WITH  DR.  Toma Erichsen  FOR  FUTURE  REFILLS--3RD  ATTEMPT   carvedilol (COREG) 3.125 MG tablet TAKE 1 TABLET BY MOUTH TWICE DAILY WITH A MEAL   furosemide (LASIX) 40 MG tablet Take 1 tablet (40 mg total) by mouth daily.   lisinopril (ZESTRIL) 5 MG tablet TAKE 1 TABLET BY MOUTH ONCE DAILY --PT  NEEDS  TO  SCHEDULE  AN  OFFICE  VISIT  WITH  DR.  Kynzli Rease  FOR  FUTURE  REFILLS--3RD  ATTEMPT     ROS: Pertinent items noted in HPI and remainder of comprehensive ROS otherwise negative.  Labs/Other Tests and Data  Reviewed:    Recent Labs: No results found for requested labs within last 365 days.   Recent Lipid Panel Lab Results  Component Value Date/Time   CHOL 165 12/27/2018 04:48 AM   TRIG 62 12/27/2018 04:48 AM   HDL 42 12/27/2018 04:48 AM   CHOLHDL 3.9 12/27/2018 04:48 AM   LDLCALC 111 (H) 12/27/2018 04:48 AM    Wt Readings from Last 3 Encounters:  10/21/21 205 lb (93 kg)  05/14/20 202 lb (91.6 kg)  05/01/19 208 lb 6.4 oz (94.5 kg)     Exam:    Vital Signs:  BP 122/78   Pulse 60   Ht '5\' 6"'$  (1.676 m)   Wt 205 lb (93 kg)   BMI 33.09 kg/m    General appearance: alert and no distress Lungs: No visual respiratory difficulty Abdomen: Overweight Extremities: extremities normal, atraumatic, no cyanosis or edema Skin: Skin color, texture, turgor normal. No rashes or lesions Neurologic: Grossly normal  ASSESSMENT & PLAN:    Stress-induced cardiomyopathy, recurrent LVEF 45 to 50% No significant coronary disease by CT coronary angiography, history of "normal" coronaries by cath in the past as well  [name] was supposed to have reassessment of her echo prior to this visit however she did not get it performed.  She now says she has to go out of the state for several months.  She would be amenable to having it reassessed in September.  We will try to reorder that and follow-up with her afterwards.  She has requested refills of her medication.  She notes some cramping sometimes when she takes Lasix, this may indicate that her EF has improved and we may be able to consider using that on a as needed basis.  COVID-19 Education: The signs and symptoms of COVID-19 were discussed with the patient and how to seek care for testing (follow up with PCP or arrange E-visit).  The importance of social distancing was discussed today.  Patient Risk:   After full review of this patients clinical status, I feel that they are at least moderate risk at this time.  Time:   Today, I have spent 25  minutes with the patient with telehealth technology discussing stress-induced cardiomyopathy.     Medication Adjustments/Labs and Tests Ordered: Current medicines are reviewed at length with the patient today.  Concerns regarding medicines are outlined above.   Tests Ordered: No orders of the defined types were placed in this encounter.   Medication Changes: No orders of the defined types were placed in this encounter.   Disposition:  in 4 month(s)  Pixie Casino, MD, [facility], [place] Director of the Advanced Lipid Disorders &  Cardiovascular Risk Reduction Clinic Diplomate of the American Board of Clinical Lipidology Attending Cardiologist  Direct Dial: [phone]  Fax: [phone]  Website:  [email or web address]  Pixie Casino, MD  10/21/2021 1:36 PM

## 2021-10-21 NOTE — Addendum Note (Signed)
Addended by: Christella Scheuermann C on: 10/21/2021 01:55 PM   Modules accepted: Orders

## 2021-10-21 NOTE — Patient Instructions (Signed)
Medication Instructions:  Your physician recommends that you continue on your current medications as directed. Please refer to the Current Medication list given to you today.   Labwork: None  Testing/Procedures: Your physician has requested that you have an echocardiogram. Echocardiography is a painless test that uses sound waves to create images of your heart. It provides your doctor with information about the size and shape of your heart and how well your heart's chambers and valves are working. This procedure takes approximately one hour. There are no restrictions for this procedure.   Follow-Up: Follow up in 4 months with Dr. Debara Pickett- IN PERSON  Any Other Special Instructions Will Be Listed Below (If Applicable).     If you need a refill on your cardiac medications before your next appointment, please call your pharmacy.

## 2021-11-04 ENCOUNTER — Other Ambulatory Visit (HOSPITAL_COMMUNITY): Payer: Self-pay

## 2022-01-07 ENCOUNTER — Other Ambulatory Visit: Payer: Self-pay | Admitting: Internal Medicine

## 2022-01-08 ENCOUNTER — Ambulatory Visit (HOSPITAL_COMMUNITY): Payer: Self-pay

## 2022-02-16 ENCOUNTER — Emergency Department (HOSPITAL_COMMUNITY)
Admission: EM | Admit: 2022-02-16 | Discharge: 2022-02-16 | Discharge status: Against Medical Advice | Payer: Medicare Other | Attending: Physician Assistant | Admitting: Physician Assistant

## 2022-02-16 ENCOUNTER — Emergency Department (HOSPITAL_COMMUNITY): Payer: Medicare Other

## 2022-02-16 ENCOUNTER — Other Ambulatory Visit: Payer: Self-pay

## 2022-02-16 ENCOUNTER — Encounter (HOSPITAL_COMMUNITY): Payer: Self-pay

## 2022-02-16 DIAGNOSIS — I509 Heart failure, unspecified: Secondary | ICD-10-CM | POA: Insufficient documentation

## 2022-02-16 DIAGNOSIS — R079 Chest pain, unspecified: Secondary | ICD-10-CM | POA: Insufficient documentation

## 2022-02-16 DIAGNOSIS — Z5321 Procedure and treatment not carried out due to patient leaving prior to being seen by health care provider: Secondary | ICD-10-CM | POA: Insufficient documentation

## 2022-02-16 LAB — COMPREHENSIVE METABOLIC PANEL
ALT: 28 U/L (ref 0–44)
AST: 56 U/L — ABNORMAL HIGH (ref 15–41)
Albumin: 3.2 g/dL — ABNORMAL LOW (ref 3.5–5.0)
Alkaline Phosphatase: 110 U/L (ref 38–126)
Anion gap: 9 (ref 5–15)
BUN: 25 mg/dL — ABNORMAL HIGH (ref 8–23)
CO2: 22 mmol/L (ref 22–32)
Calcium: 8.7 mg/dL — ABNORMAL LOW (ref 8.9–10.3)
Chloride: 104 mmol/L (ref 98–111)
Creatinine, Ser: 1.66 mg/dL — ABNORMAL HIGH (ref 0.44–1.00)
GFR, Estimated: 34 mL/min — ABNORMAL LOW (ref >60)
Glucose, Bld: 123 mg/dL — ABNORMAL HIGH (ref 70–99)
Potassium: 4 mmol/L (ref 3.5–5.1)
Sodium: 135 mmol/L (ref 135–145)
Total Bilirubin: 1.1 mg/dL (ref 0.3–1.2)
Total Protein: 7 g/dL (ref 6.5–8.1)

## 2022-02-16 LAB — CBC WITH DIFFERENTIAL/PLATELET
Abs Immature Granulocytes: 0.03 10*3/uL (ref 0.00–0.07)
Basophils Absolute: 0.1 10*3/uL (ref 0.0–0.1)
Basophils Relative: 1 %
Eosinophils Absolute: 0.1 10*3/uL (ref 0.0–0.5)
Eosinophils Relative: 2 %
HCT: 36.3 % (ref 36.0–46.0)
Hemoglobin: 11.6 g/dL — ABNORMAL LOW (ref 12.0–15.0)
Immature Granulocytes: 1 %
Lymphocytes Relative: 27 %
Lymphs Abs: 1.7 10*3/uL (ref 0.7–4.0)
MCH: 30.9 pg (ref 26.0–34.0)
MCHC: 32 g/dL (ref 30.0–36.0)
MCV: 96.5 fL (ref 80.0–100.0)
Monocytes Absolute: 0.5 10*3/uL (ref 0.1–1.0)
Monocytes Relative: 7 %
Neutro Abs: 4 10*3/uL (ref 1.7–7.7)
Neutrophils Relative %: 62 %
Platelets: 180 10*3/uL (ref 150–400)
RBC: 3.76 MIL/uL — ABNORMAL LOW (ref 3.87–5.11)
RDW: 13.4 % (ref 11.5–15.5)
WBC: 6.4 10*3/uL (ref 4.0–10.5)
nRBC: 0 % (ref 0.0–0.2)

## 2022-02-16 LAB — BRAIN NATRIURETIC PEPTIDE: B Natriuretic Peptide: 13.7 pg/mL (ref 0.0–100.0)

## 2022-02-16 LAB — TROPONIN I (HIGH SENSITIVITY): Troponin I (High Sensitivity): 4 ng/L (ref <18)

## 2022-02-16 LAB — LIPASE, BLOOD: Lipase: 52 U/L — ABNORMAL HIGH (ref 11–51)

## 2022-02-16 MED ORDER — IOHEXOL 350 MG/ML SOLN
50.0000 mL | Freq: Once | INTRAVENOUS | Status: AC | PRN
  Administered 2022-02-16: 50 mL via INTRAVENOUS

## 2022-02-16 NOTE — ED Notes (Signed)
PATIENT LEFT AMA REMOVED IV FROM RIGHT

## 2022-02-16 NOTE — ED Triage Notes (Signed)
PER EMS: pt is from home with c/o sudden onset of right sided non-reproducible chest pain worse with inspiration, around 0100 associated with exertional SOB. Denies n/v. She took ibuprofen prior to ems arrival without relief.  BP- 158/70, HR-80, RR-17, 98% RA. CBG-125

## 2022-02-16 NOTE — ED Provider Triage Note (Signed)
Emergency Medicine Provider Triage Evaluation Note  TIFFINIE CAILLIER , a 66 y.o. female  was evaluated in triage.  Pt complains of chest pain which started about 2 hours ago.  She also endorses pleuritic type symptoms with pain on inspiration.  Having difficulty catching a deep breath with some exertional chest pain.  Does not feel like prior MI. Not on anticoagulation. No recent travel. H/o CHF and takotsubo's. She has noticed bilateral leg swelling.  No N/V/diaphoresis. No cough, no traumatic history. No history of PE, not on anticoagulation    Review of Systems  Positive: As above Negative: Above   Physical Exam  BP 131/64   Pulse 78   Temp 98.8 F (37.1 C) (Oral)   Resp 16   SpO2 96%  Gen:   Awake, no distress   Resp:  Normal effort  MSK:   Moves extremities without difficulty  Other:  No reproducible chest wall tenderness. CTAB throughout   Medical Decision Making  Medically screening exam initiated at 2:42 AM.  Appropriate orders placed.  JAMILETTE SUCHOCKI was informed that the remainder of the evaluation will be completed by another provider, this initial triage assessment does not replace that evaluation, and the importance of remaining in the ED until their evaluation is complete.     Garald Balding, PA-C 02/16/22 [phone]

## 2022-02-16 NOTE — ED Triage Notes (Signed)
Pt reports she is not having chest pain but states the pain is in her left upper back area.

## 2022-02-24 ENCOUNTER — Ambulatory Visit (HOSPITAL_COMMUNITY): Payer: Medicare Other

## 2022-03-10 ENCOUNTER — Ambulatory Visit (HOSPITAL_COMMUNITY)
Admission: RE | Admit: 2022-03-10 | Discharge: 2022-03-10 | Disposition: A | Discharge status: Home / Self Care | Payer: Medicare Other | Source: Ambulatory Visit | Attending: Internal Medicine | Admitting: Internal Medicine

## 2022-03-10 DIAGNOSIS — I110 Hypertensive heart disease with heart failure: Secondary | ICD-10-CM | POA: Insufficient documentation

## 2022-03-10 DIAGNOSIS — I509 Heart failure, unspecified: Secondary | ICD-10-CM | POA: Diagnosis not present

## 2022-03-10 DIAGNOSIS — E669 Obesity, unspecified: Secondary | ICD-10-CM | POA: Diagnosis not present

## 2022-03-10 DIAGNOSIS — F17200 Nicotine dependence, unspecified, uncomplicated: Secondary | ICD-10-CM | POA: Diagnosis not present

## 2022-03-10 DIAGNOSIS — I428 Other cardiomyopathies: Secondary | ICD-10-CM | POA: Insufficient documentation

## 2022-03-10 DIAGNOSIS — I252 Old myocardial infarction: Secondary | ICD-10-CM | POA: Insufficient documentation

## 2022-03-10 DIAGNOSIS — E785 Hyperlipidemia, unspecified: Secondary | ICD-10-CM | POA: Insufficient documentation

## 2022-03-10 DIAGNOSIS — R0600 Dyspnea, unspecified: Secondary | ICD-10-CM | POA: Insufficient documentation

## 2022-03-10 DIAGNOSIS — J449 Chronic obstructive pulmonary disease, unspecified: Secondary | ICD-10-CM | POA: Diagnosis not present

## 2022-03-10 LAB — ECHOCARDIOGRAM COMPLETE
Area-P 1/2: 4.01 cm2
Calc EF: 54.4 %
MV VTI: 1.98 cm2
S' Lateral: 3.9 cm
Single Plane A2C EF: 53.7 %
Single Plane A4C EF: 56.6 %

## 2022-03-10 NOTE — Progress Notes (Signed)
  Echocardiogram 2D Echocardiogram has been performed.  [name] 03/10/2022, 9:56 AM

## 2022-07-15 ENCOUNTER — Other Ambulatory Visit: Payer: Self-pay | Admitting: Internal Medicine

## 2022-10-10 ENCOUNTER — Other Ambulatory Visit: Payer: Self-pay | Admitting: Internal Medicine

## 2022-10-11 ENCOUNTER — Other Ambulatory Visit: Payer: Self-pay | Admitting: Internal Medicine

## 2022-10-14 ENCOUNTER — Encounter (HOSPITAL_BASED_OUTPATIENT_CLINIC_OR_DEPARTMENT_OTHER): Payer: Self-pay | Admitting: Internal Medicine

## 2022-10-14 ENCOUNTER — Other Ambulatory Visit: Payer: Self-pay

## 2022-10-14 MED ORDER — ATORVASTATIN CALCIUM 80 MG PO TABS: 80.0000 mg | ORAL_TABLET | Freq: Every day | ORAL | 1 refills | Status: DC

## 2022-10-14 MED ORDER — LISINOPRIL 5 MG PO TABS: ORAL_TABLET | ORAL | 1 refills | Status: DC

## 2022-10-14 NOTE — Telephone Encounter (Signed)
SOPHEAP BASIC  P Cv Div Nl Triage (supporting Chrystie Nose, MD)26 minutes ago (10:29 AM)    Lisinopril and Atorvastatin    Demetris R Staley, LPN  Zoeann Mol [id number] minutes ago (10:26 AM)    Hi Ms. Boeh,   What medications are you needing a refill on?        10/14/22 refilled as requested,C.Linzy Darling,RN

## 2022-11-23 ENCOUNTER — Other Ambulatory Visit: Payer: Self-pay | Admitting: Internal Medicine

## 2022-11-23 ENCOUNTER — Other Ambulatory Visit: Payer: Self-pay

## 2022-11-23 MED ORDER — CARVEDILOL 3.125 MG PO TABS: 3.1250 mg | ORAL_TABLET | Freq: Two times a day (BID) | ORAL | 1 refills | Status: DC

## 2023-03-03 ENCOUNTER — Ambulatory Visit: Payer: Medicare Other | Attending: Internal Medicine | Admitting: Internal Medicine

## 2023-05-11 ENCOUNTER — Other Ambulatory Visit: Payer: Self-pay | Admitting: Internal Medicine

## 2023-05-11 NOTE — Telephone Encounter (Signed)
 Pt is requesting a refill on lisinopril. Pt has not been seen since 10/21/2021. Pt had an appt 03/03/2023 and had a no show. Would Dr. Rennis Golden like to refill this medication without pt making an  appt? Please address

## 2023-10-07 ENCOUNTER — Encounter (HOSPITAL_COMMUNITY)
Admission: EM | Disposition: A | Discharge status: Home / Self Care | Payer: Self-pay | Source: Home / Self Care | Attending: Internal Medicine

## 2023-10-07 ENCOUNTER — Inpatient Hospital Stay (HOSPITAL_COMMUNITY)

## 2023-10-07 ENCOUNTER — Emergency Department (HOSPITAL_COMMUNITY)

## 2023-10-07 ENCOUNTER — Inpatient Hospital Stay (HOSPITAL_COMMUNITY)
Admission: EM | Admit: 2023-10-07 | Discharge: 2023-10-10 | DRG: 286 | Disposition: A | Discharge status: Home / Self Care | Attending: Internal Medicine | Admitting: Internal Medicine

## 2023-10-07 ENCOUNTER — Encounter (HOSPITAL_COMMUNITY): Payer: Self-pay

## 2023-10-07 ENCOUNTER — Other Ambulatory Visit: Payer: Self-pay

## 2023-10-07 DIAGNOSIS — E785 Hyperlipidemia, unspecified: Secondary | ICD-10-CM | POA: Diagnosis present

## 2023-10-07 DIAGNOSIS — Z8249 Family history of ischemic heart disease and other diseases of the circulatory system: Secondary | ICD-10-CM

## 2023-10-07 DIAGNOSIS — I428 Other cardiomyopathies: Secondary | ICD-10-CM | POA: Diagnosis present

## 2023-10-07 DIAGNOSIS — K219 Gastro-esophageal reflux disease without esophagitis: Secondary | ICD-10-CM | POA: Diagnosis present

## 2023-10-07 DIAGNOSIS — I252 Old myocardial infarction: Secondary | ICD-10-CM | POA: Diagnosis not present

## 2023-10-07 DIAGNOSIS — Z7982 Long term (current) use of aspirin: Secondary | ICD-10-CM

## 2023-10-07 DIAGNOSIS — Z7951 Long term (current) use of inhaled steroids: Secondary | ICD-10-CM | POA: Diagnosis not present

## 2023-10-07 DIAGNOSIS — I200 Unstable angina: Secondary | ICD-10-CM

## 2023-10-07 DIAGNOSIS — I5020 Unspecified systolic (congestive) heart failure: Secondary | ICD-10-CM | POA: Diagnosis not present

## 2023-10-07 DIAGNOSIS — Z79899 Other long term (current) drug therapy: Secondary | ICD-10-CM

## 2023-10-07 DIAGNOSIS — I5181 Takotsubo syndrome: Principal | ICD-10-CM | POA: Diagnosis present

## 2023-10-07 DIAGNOSIS — R0789 Other chest pain: Secondary | ICD-10-CM | POA: Diagnosis present

## 2023-10-07 DIAGNOSIS — N1832 Chronic kidney disease, stage 3b: Secondary | ICD-10-CM | POA: Diagnosis present

## 2023-10-07 DIAGNOSIS — E66811 Obesity, class 1: Secondary | ICD-10-CM | POA: Diagnosis present

## 2023-10-07 DIAGNOSIS — Z87891 Personal history of nicotine dependence: Secondary | ICD-10-CM

## 2023-10-07 DIAGNOSIS — F419 Anxiety disorder, unspecified: Secondary | ICD-10-CM | POA: Diagnosis present

## 2023-10-07 DIAGNOSIS — I5021 Acute systolic (congestive) heart failure: Secondary | ICD-10-CM | POA: Diagnosis present

## 2023-10-07 DIAGNOSIS — I10 Essential (primary) hypertension: Secondary | ICD-10-CM | POA: Diagnosis not present

## 2023-10-07 DIAGNOSIS — Z6833 Body mass index (BMI) 33.0-33.9, adult: Secondary | ICD-10-CM

## 2023-10-07 DIAGNOSIS — I2489 Other forms of acute ischemic heart disease: Principal | ICD-10-CM | POA: Diagnosis present

## 2023-10-07 DIAGNOSIS — I214 Non-ST elevation (NSTEMI) myocardial infarction: Secondary | ICD-10-CM | POA: Diagnosis not present

## 2023-10-07 DIAGNOSIS — J449 Chronic obstructive pulmonary disease, unspecified: Secondary | ICD-10-CM | POA: Diagnosis present

## 2023-10-07 DIAGNOSIS — R0602 Shortness of breath: Secondary | ICD-10-CM | POA: Diagnosis present

## 2023-10-07 DIAGNOSIS — R7989 Other specified abnormal findings of blood chemistry: Secondary | ICD-10-CM

## 2023-10-07 DIAGNOSIS — R079 Chest pain, unspecified: Secondary | ICD-10-CM

## 2023-10-07 DIAGNOSIS — I129 Hypertensive chronic kidney disease with stage 1 through stage 4 chronic kidney disease, or unspecified chronic kidney disease: Secondary | ICD-10-CM | POA: Diagnosis present

## 2023-10-07 DIAGNOSIS — J441 Chronic obstructive pulmonary disease with (acute) exacerbation: Secondary | ICD-10-CM | POA: Diagnosis not present

## 2023-10-07 HISTORY — PX: RIGHT/LEFT HEART CATH AND CORONARY ANGIOGRAPHY: CATH118266

## 2023-10-07 LAB — POCT I-STAT EG7
Acid-Base Excess: 0 mmol/L (ref 0.0–2.0)
Acid-base deficit: 1 mmol/L (ref 0.0–2.0)
Acid-base deficit: 1 mmol/L (ref 0.0–2.0)
Bicarbonate: 22.8 mmol/L (ref 20.0–28.0)
Bicarbonate: 23.1 mmol/L (ref 20.0–28.0)
Bicarbonate: 23.1 mmol/L (ref 20.0–28.0)
Calcium, Ion: 1.12 mmol/L — ABNORMAL LOW (ref 1.15–1.40)
Calcium, Ion: 1.13 mmol/L — ABNORMAL LOW (ref 1.15–1.40)
Calcium, Ion: 1.16 mmol/L (ref 1.15–1.40)
HCT: 32 % — ABNORMAL LOW (ref 36.0–46.0)
HCT: 33 % — ABNORMAL LOW (ref 36.0–46.0)
HCT: 34 % — ABNORMAL LOW (ref 36.0–46.0)
Hemoglobin: 10.9 g/dL — ABNORMAL LOW (ref 12.0–15.0)
Hemoglobin: 11.2 g/dL — ABNORMAL LOW (ref 12.0–15.0)
Hemoglobin: 11.6 g/dL — ABNORMAL LOW (ref 12.0–15.0)
O2 Saturation: 73 %
O2 Saturation: 73 %
O2 Saturation: 74 %
Potassium: 3.5 mmol/L (ref 3.5–5.1)
Potassium: 3.6 mmol/L (ref 3.5–5.1)
Potassium: 3.6 mmol/L (ref 3.5–5.1)
Sodium: 141 mmol/L (ref 135–145)
Sodium: 141 mmol/L (ref 135–145)
Sodium: 142 mmol/L (ref 135–145)
TCO2: 24 mmol/L (ref 22–32)
TCO2: 24 mmol/L (ref 22–32)
TCO2: 24 mmol/L (ref 22–32)
pCO2, Ven: 32.7 mmHg — ABNORMAL LOW (ref 44–60)
pCO2, Ven: 32.7 mmHg — ABNORMAL LOW (ref 44–60)
pCO2, Ven: 33.6 mmHg — ABNORMAL LOW (ref 44–60)
pH, Ven: 7.444 — ABNORMAL HIGH (ref 7.25–7.43)
pH, Ven: 7.45 — ABNORMAL HIGH (ref 7.25–7.43)
pH, Ven: 7.458 — ABNORMAL HIGH (ref 7.25–7.43)
pO2, Ven: 36 mmHg (ref 32–45)
pO2, Ven: 37 mmHg (ref 32–45)
pO2, Ven: 37 mmHg (ref 32–45)

## 2023-10-07 LAB — LIPID PANEL
Cholesterol: 143 mg/dL (ref 0–200)
HDL: 61 mg/dL (ref >40)
LDL Cholesterol: 72 mg/dL (ref 0–99)
Total CHOL/HDL Ratio: 2.3 ratio
Triglycerides: 49 mg/dL (ref <150)
VLDL: 10 mg/dL (ref 0–40)

## 2023-10-07 LAB — BASIC METABOLIC PANEL WITH GFR
Anion gap: 11 (ref 5–15)
BUN: 21 mg/dL (ref 8–23)
CO2: 22 mmol/L (ref 22–32)
Calcium: 9.3 mg/dL (ref 8.9–10.3)
Chloride: 105 mmol/L (ref 98–111)
Creatinine, Ser: 1.7 mg/dL — ABNORMAL HIGH (ref 0.44–1.00)
GFR, Estimated: 33 mL/min — ABNORMAL LOW (ref >60)
Glucose, Bld: 133 mg/dL — ABNORMAL HIGH (ref 70–99)
Potassium: 4 mmol/L (ref 3.5–5.1)
Sodium: 138 mmol/L (ref 135–145)

## 2023-10-07 LAB — TROPONIN I (HIGH SENSITIVITY)
Troponin I (High Sensitivity): 3025 ng/L (ref <18)
Troponin I (High Sensitivity): 3198 ng/L (ref <18)
Troponin I (High Sensitivity): 3294 ng/L (ref <18)
Troponin I (High Sensitivity): 3743 ng/L (ref <18)

## 2023-10-07 LAB — POCT I-STAT 7, (LYTES, BLD GAS, ICA,H+H)
Acid-base deficit: 2 mmol/L (ref 0.0–2.0)
Bicarbonate: 20.5 mmol/L (ref 20.0–28.0)
Calcium, Ion: 1.12 mmol/L — ABNORMAL LOW (ref 1.15–1.40)
HCT: 33 % — ABNORMAL LOW (ref 36.0–46.0)
Hemoglobin: 11.2 g/dL — ABNORMAL LOW (ref 12.0–15.0)
O2 Saturation: 96 %
Potassium: 3.6 mmol/L (ref 3.5–5.1)
Sodium: 141 mmol/L (ref 135–145)
TCO2: 21 mmol/L — ABNORMAL LOW (ref 22–32)
pCO2 arterial: 27.4 mmHg — ABNORMAL LOW (ref 32–48)
pH, Arterial: 7.482 — ABNORMAL HIGH (ref 7.35–7.45)
pO2, Arterial: 74 mmHg — ABNORMAL LOW (ref 83–108)

## 2023-10-07 LAB — CBC
HCT: 39 % (ref 36.0–46.0)
Hemoglobin: 12.7 g/dL (ref 12.0–15.0)
MCH: 31 pg (ref 26.0–34.0)
MCHC: 32.6 g/dL (ref 30.0–36.0)
MCV: 95.1 fL (ref 80.0–100.0)
Platelets: 189 10*3/uL (ref 150–400)
RBC: 4.1 MIL/uL (ref 3.87–5.11)
RDW: 14.1 % (ref 11.5–15.5)
WBC: 11.2 10*3/uL — ABNORMAL HIGH (ref 4.0–10.5)
nRBC: 0 % (ref 0.0–0.2)

## 2023-10-07 LAB — ECHOCARDIOGRAM COMPLETE
Area-P 1/2: 5.38 cm2
Height: 66 in
S' Lateral: 3.4 cm
Weight: 3312 [oz_av]

## 2023-10-07 LAB — HEMOGLOBIN A1C
Hgb A1c MFr Bld: 4.6 % — ABNORMAL LOW (ref 4.8–5.6)
Mean Plasma Glucose: 85.32 mg/dL

## 2023-10-07 LAB — HEPARIN LEVEL (UNFRACTIONATED)
Heparin Unfractionated: >1.1 [IU]/mL — ABNORMAL HIGH (ref 0.30–0.70)
Heparin Unfractionated: 0.43 [IU]/mL (ref 0.30–0.70)

## 2023-10-07 LAB — BRAIN NATRIURETIC PEPTIDE: B Natriuretic Peptide: 112.9 pg/mL — ABNORMAL HIGH (ref 0.0–100.0)

## 2023-10-07 LAB — TSH: TSH: 1.817 u[IU]/mL (ref 0.350–4.500)

## 2023-10-07 LAB — HIV ANTIBODY (ROUTINE TESTING W REFLEX): HIV Screen 4th Generation wRfx: NONREACTIVE

## 2023-10-07 LAB — T4, FREE: Free T4: 1.51 ng/dL — ABNORMAL HIGH (ref 0.61–1.12)

## 2023-10-07 SURGERY — RIGHT/LEFT HEART CATH AND CORONARY ANGIOGRAPHY
Anesthesia: LOCAL

## 2023-10-07 MED ORDER — VERAPAMIL HCL 2.5 MG/ML IV SOLN
INTRAVENOUS | Status: AC
  Filled 2023-10-07: qty 2

## 2023-10-07 MED ORDER — HYDRALAZINE HCL 20 MG/ML IJ SOLN: 10.0000 mg | INTRAMUSCULAR | Status: AC | PRN

## 2023-10-07 MED ORDER — HEPARIN BOLUS VIA INFUSION
4000.0000 [IU] | Freq: Once | INTRAVENOUS | Status: AC
  Administered 2023-10-07: 4000 [IU] via INTRAVENOUS
  Filled 2023-10-07: qty 4000

## 2023-10-07 MED ORDER — FUROSEMIDE 10 MG/ML IJ SOLN
100.0000 mg | Freq: Once | INTRAVENOUS | Status: AC
  Administered 2023-10-07: 100 mg via INTRAVENOUS
  Filled 2023-10-07: qty 10

## 2023-10-07 MED ORDER — HEPARIN (PORCINE) 25000 UT/250ML-% IV SOLN
1150.0000 [IU]/h | INTRAVENOUS | Status: DC
  Administered 2023-10-07: 1050 [IU]/h via INTRAVENOUS
  Filled 2023-10-07: qty 250

## 2023-10-07 MED ORDER — ATORVASTATIN CALCIUM 80 MG PO TABS
80.0000 mg | ORAL_TABLET | Freq: Every day | ORAL | Status: DC
  Administered 2023-10-07 – 2023-10-10 (×4): 80 mg via ORAL
  Filled 2023-10-07: qty 2
  Filled 2023-10-07 (×3): qty 1

## 2023-10-07 MED ORDER — ACETAMINOPHEN 325 MG PO TABS: 650.0000 mg | ORAL_TABLET | ORAL | Status: DC | PRN

## 2023-10-07 MED ORDER — NITROGLYCERIN IN D5W 200-5 MCG/ML-% IV SOLN: 0.0000 ug/min | INTRAVENOUS | Status: DC

## 2023-10-07 MED ORDER — HEPARIN SODIUM (PORCINE) 1000 UNIT/ML IJ SOLN
INTRAMUSCULAR | Status: AC
  Filled 2023-10-07: qty 10

## 2023-10-07 MED ORDER — MORPHINE SULFATE (PF) 2 MG/ML IV SOLN: 2.0000 mg | INTRAVENOUS | Status: DC | PRN

## 2023-10-07 MED ORDER — PANTOPRAZOLE SODIUM 40 MG IV SOLR
40.0000 mg | Freq: Two times a day (BID) | INTRAVENOUS | Status: DC
  Administered 2023-10-07 (×2): 40 mg via INTRAVENOUS
  Filled 2023-10-07 (×2): qty 10

## 2023-10-07 MED ORDER — VERAPAMIL HCL 2.5 MG/ML IV SOLN
INTRAVENOUS | Status: DC | PRN
  Administered 2023-10-07: 10 mL via INTRA_ARTERIAL

## 2023-10-07 MED ORDER — ONDANSETRON HCL 4 MG/2ML IJ SOLN
4.0000 mg | Freq: Four times a day (QID) | INTRAMUSCULAR | Status: DC | PRN
  Administered 2023-10-08: 4 mg via INTRAVENOUS
  Filled 2023-10-07: qty 2

## 2023-10-07 MED ORDER — SODIUM CHLORIDE 0.9 % IV BOLUS
250.0000 mL | Freq: Once | INTRAVENOUS | Status: AC
  Administered 2023-10-07: 250 mL via INTRAVENOUS

## 2023-10-07 MED ORDER — LIDOCAINE HCL (PF) 1 % IJ SOLN
INTRAMUSCULAR | Status: DC | PRN
  Administered 2023-10-07: 2 mL
  Administered 2023-10-07: 5 mL

## 2023-10-07 MED ORDER — IOHEXOL 350 MG/ML SOLN
INTRAVENOUS | Status: DC | PRN
  Administered 2023-10-07: 20 mL

## 2023-10-07 MED ORDER — SODIUM CHLORIDE 0.9% FLUSH
3.0000 mL | INTRAVENOUS | Status: DC | PRN
Start: 2023-10-07 — End: 2023-10-10

## 2023-10-07 MED ORDER — SODIUM CHLORIDE 0.9 % IV SOLN: 250.0000 mL | INTRAVENOUS | Status: AC | PRN

## 2023-10-07 MED ORDER — HEPARIN (PORCINE) IN NACL 1000-0.9 UT/500ML-% IV SOLN
INTRAVENOUS | Status: DC | PRN
  Administered 2023-10-07 (×2): 500 mL

## 2023-10-07 MED ORDER — FUROSEMIDE 10 MG/ML IJ SOLN
40.0000 mg | Freq: Two times a day (BID) | INTRAMUSCULAR | Status: DC
  Filled 2023-10-07 (×2): qty 4

## 2023-10-07 MED ORDER — HEPARIN (PORCINE) 25000 UT/250ML-% IV SOLN
950.0000 [IU]/h | INTRAVENOUS | Status: DC
  Administered 2023-10-07: 950 [IU]/h via INTRAVENOUS
  Filled 2023-10-07: qty 250

## 2023-10-07 MED ORDER — IPRATROPIUM-ALBUTEROL 0.5-2.5 (3) MG/3ML IN SOLN
3.0000 mL | RESPIRATORY_TRACT | Status: DC
  Administered 2023-10-07 – 2023-10-08 (×7): 3 mL via RESPIRATORY_TRACT
  Filled 2023-10-07 (×7): qty 3

## 2023-10-07 MED ORDER — NITROGLYCERIN 0.4 MG SL SUBL: 0.4000 mg | SUBLINGUAL_TABLET | SUBLINGUAL | Status: DC | PRN

## 2023-10-07 MED ORDER — SODIUM CHLORIDE 0.9% FLUSH
3.0000 mL | Freq: Two times a day (BID) | INTRAVENOUS | Status: DC
  Administered 2023-10-07 – 2023-10-10 (×6): 3 mL via INTRAVENOUS

## 2023-10-07 MED ORDER — HEPARIN SODIUM (PORCINE) 1000 UNIT/ML IJ SOLN
INTRAMUSCULAR | Status: DC | PRN
  Administered 2023-10-07: 5000 [IU] via INTRAVENOUS

## 2023-10-07 MED ORDER — STERILE WATER FOR INJECTION IJ SOLN
INTRAMUSCULAR | Status: AC
  Filled 2023-10-07: qty 10

## 2023-10-07 MED ORDER — LIDOCAINE HCL (PF) 1 % IJ SOLN
INTRAMUSCULAR | Status: AC
  Filled 2023-10-07: qty 30

## 2023-10-07 MED ORDER — SODIUM CHLORIDE 0.9 % WEIGHT BASED INFUSION: 1.0000 mL/kg/h | INTRAVENOUS | Status: DC

## 2023-10-07 MED ORDER — ASPIRIN 81 MG PO CHEW
81.0000 mg | CHEWABLE_TABLET | ORAL | Status: AC
  Administered 2023-10-07: 81 mg via ORAL
  Filled 2023-10-07: qty 1

## 2023-10-07 SURGICAL SUPPLY — 10 items
CATH 5FR JL3.5 JR4 ANG PIG MP (CATHETERS) IMPLANT
CATH BALLN WEDGE 5F 110CM (CATHETERS) IMPLANT
CATH INFINITI 5 FR 3DRC (CATHETERS) IMPLANT
DEVICE RAD COMP TR BAND LRG (VASCULAR PRODUCTS) IMPLANT
GLIDESHEATH SLEND A-KIT 6F 22G (SHEATH) IMPLANT
GUIDEWIRE .025 260CM (WIRE) IMPLANT
GUIDEWIRE INQWIRE 1.5J.035X260 (WIRE) IMPLANT
PACK CARDIAC CATHETERIZATION (CUSTOM PROCEDURE TRAY) ×1 IMPLANT
SET ATX-X65L (MISCELLANEOUS) IMPLANT
SHEATH GLIDE SLENDER 4/5FR (SHEATH) IMPLANT

## 2023-10-07 NOTE — ED Notes (Signed)
Bedside echo in process.
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
## 2023-10-07 NOTE — ED Notes (Signed)
 Went to cath lab

## 2023-10-07 NOTE — Assessment & Plan Note (Addendum)
 10-07-2023 prior hx of Taksotsubo.  Today's echo shows LVEF 35%. Need to r/o LAD disease with LHC.  10-08-2023 clean coronaries. Diuresis per cardiology service.  10-09-2023 cards has held diuresis today. Repeat BMP in AM. GDMT per cardiology service.  10-10-2023 cards wants her to go home on coreg  and farxiga. Holding ACE/ARB and diuretics for now. Repeat BMP in cards office next week.

## 2023-10-07 NOTE — Interval H&P Note (Signed)
 History and Physical Interval Note:  10/07/2023 5:06 PM  [name]  has presented today for surgery, with the diagnosis of NSTEMI.  The various methods of treatment have been discussed with the patient and family. After consideration of risks, benefits and other options for treatment, the patient has consented to  Procedure(s): RIGHT/LEFT HEART CATH AND CORONARY ANGIOGRAPHY (N/A) as a surgical intervention.  The patient's history has been reviewed, patient examined, no change in status, stable for surgery.  I have reviewed the patient's chart and labs.  Questions were answered to the patient's satisfaction.     Christon Parada J [name]

## 2023-10-07 NOTE — Assessment & Plan Note (Signed)
Body mass index is 33.41 kg/m.
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
## 2023-10-07 NOTE — Progress Notes (Signed)
 Echocardiogram 2D Echocardiogram has been performed.  Farley Honer, RDCS 10/07/2023, 9:09 AM

## 2023-10-07 NOTE — ED Notes (Signed)
 Received report from previous RN.  Pt resting, reports slightly uncomfortable but denies wanting any meds.  Call bell within reach.

## 2023-10-07 NOTE — Progress Notes (Signed)
 Nurse called reporting patient with chest pain, SOB, low BP 88/53. Chart reviewed, cath showed no CAD but stress induced CM, started on IV lasix  40mg  BID, BP has been low 90-100s at baseline. She is sleeping with CPAP currently per nurse. Advised to encourage PO intake, Duoneb for known COPD, hold additional lasix  tonight, repeat BP in 1 hour. If she continue to have worsening symptoms and low BP, call back.

## 2023-10-07 NOTE — ED Notes (Signed)
 Pt found to have pulled her IV out. Got back in bed, cleaned, and replaced with a 20 in the left wrist.  Call bell within reach. Encouraged to call for help with getting up - verbalizes understanding. Denies further needs.

## 2023-10-07 NOTE — ED Notes (Signed)
Help patient back in bed patient is resting with call bell in reach  ?

## 2023-10-07 NOTE — Progress Notes (Signed)
 TR BAND REMOVAL  LOCATION:    right radial  DEFLATED PER PROTOCOL:    Yes.    TIME BAND OFF / DRESSING APPLIED:    2120   SITE UPON ARRIVAL:    Level 0  SITE AFTER BAND REMOVAL:    Level 0  CIRCULATION SENSATION AND MOVEMENT:    Within Normal Limits   Yes.    COMMENTS:  Sterile dressing applied, good capillary refill and post cath site care instructed and verbalized understanding.
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
## 2023-10-07 NOTE — Assessment & Plan Note (Addendum)
 10-07-2023 continue lipitor . Lipid Panel: Lab Results  Component Value Date/Time   CHOL 143 10/07/2023 09:45 AM   TRIG 49 10/07/2023 09:45 AM   HDL 61 10/07/2023 09:45 AM   CHOLHDL 2.3 10/07/2023 09:45 AM   VLDL 10 10/07/2023 09:45 AM   LDLCALC 72 10/07/2023 09:45 AM   10-08-2023 continue lipitor  80 mg daily.  10-09-2023 stable  10-10-2023 home with lipitor  80 mg daily

## 2023-10-07 NOTE — Assessment & Plan Note (Addendum)
 10-07-2023 stable.  10-08-2023 now acutely exacerbated.  Give 1 dose IV solumedrol 125 mg. Start po prednisone  40 mg daily tomorrow. Increase duonebs to QID. Close monitoring.  10-09-2023 on po prednisone  and qid nebs. Stable from respiratory stand point to go home.  10-10-2023 DC to home with prednisone  and nebs.
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
## 2023-10-07 NOTE — Subjective & Objective (Addendum)
 Pt seen and examined Remains On RA. Breathing stable. Has home neb machine in her room. Scr is stable.
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
## 2023-10-07 NOTE — Assessment & Plan Note (Addendum)
 10-07-2023 cards consulted. Continue IV heparin . For LHC today.  10-08-2023 cardiac cath negative. Clean coronaries. Pt with stress induced cardiomyopathy(Takatsubo). On IV lasix  per cards. Stop IV heparin  gtts.  10-09-2023 LVEF 35-40%. Cards thinks this is a stress-induced cardiomyopathy. No intervention performed on LHC.
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
## 2023-10-07 NOTE — Assessment & Plan Note (Addendum)
 10-07-2023 monitor Scr after LHC. Holding ACEI.  10-08-2023 Scr 1.82 today. Baseline scr 1.6-1.7  10-09-2023 scr 1.92. baseline 1.6-1.7. cards has stopped lasix  today.  10-10-2023 Holding ACE/ARB and diuretics for now per cardiology. Repeat BMP in cards office next week. Scr stable at 1.93 today.

## 2023-10-07 NOTE — Progress Notes (Signed)
 RT came to bedside for a continuous BIPAP order. RT assessed pt. Pt has no SOB, lung sounds clear and is in no distress. MD aware. BIPAP on standby if needed. RT will monitor.
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
## 2023-10-07 NOTE — Progress Notes (Signed)
 Discussed with RN, 200 ml UOP in the last 2 hours. If she is unable to lay flat, we will place BiPAP.   Velvet Gibbs, MD MS  Cardiology [facility]
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
## 2023-10-07 NOTE — Assessment & Plan Note (Addendum)
 10-07-2023 hold ACEI due to [facility] today.  10-08-2023 BP low today. Maybe from diuresis yesterday. Hold on starting any HTN meds.  10-09-2023 BP med management per cardiology service.  10-10-2023 cards wants her to go home on coreg  and farxiga. Holding ACE/ARB and diuretics for now. Repeat BMP in cards office next week.

## 2023-10-08 DIAGNOSIS — J441 Chronic obstructive pulmonary disease with (acute) exacerbation: Secondary | ICD-10-CM | POA: Diagnosis not present

## 2023-10-08 DIAGNOSIS — I5181 Takotsubo syndrome: Secondary | ICD-10-CM | POA: Diagnosis not present

## 2023-10-08 DIAGNOSIS — N1832 Chronic kidney disease, stage 3b: Secondary | ICD-10-CM | POA: Diagnosis not present

## 2023-10-08 DIAGNOSIS — I214 Non-ST elevation (NSTEMI) myocardial infarction: Secondary | ICD-10-CM | POA: Diagnosis not present

## 2023-10-08 LAB — BASIC METABOLIC PANEL WITH GFR
Anion gap: 7 (ref 5–15)
BUN: 24 mg/dL — ABNORMAL HIGH (ref 8–23)
CO2: 22 mmol/L (ref 22–32)
Calcium: 8.6 mg/dL — ABNORMAL LOW (ref 8.9–10.3)
Chloride: 108 mmol/L (ref 98–111)
Creatinine, Ser: 1.84 mg/dL — ABNORMAL HIGH (ref 0.44–1.00)
GFR, Estimated: 30 mL/min — ABNORMAL LOW (ref >60)
Glucose, Bld: 105 mg/dL — ABNORMAL HIGH (ref 70–99)
Potassium: 3.9 mmol/L (ref 3.5–5.1)
Sodium: 137 mmol/L (ref 135–145)

## 2023-10-08 LAB — CBC
HCT: 30.7 % — ABNORMAL LOW (ref 36.0–46.0)
Hemoglobin: 10.2 g/dL — ABNORMAL LOW (ref 12.0–15.0)
MCH: 30.9 pg (ref 26.0–34.0)
MCHC: 33.2 g/dL (ref 30.0–36.0)
MCV: 93 fL (ref 80.0–100.0)
Platelets: 95 10*3/uL — ABNORMAL LOW (ref 150–400)
RBC: 3.3 MIL/uL — ABNORMAL LOW (ref 3.87–5.11)
RDW: 14.3 % (ref 11.5–15.5)
WBC: 5.4 10*3/uL (ref 4.0–10.5)
nRBC: 0 % (ref 0.0–0.2)

## 2023-10-08 LAB — MAGNESIUM: Magnesium: 1.9 mg/dL (ref 1.7–2.4)

## 2023-10-08 LAB — HEPARIN LEVEL (UNFRACTIONATED): Heparin Unfractionated: 0.2 [IU]/mL — ABNORMAL LOW (ref 0.30–0.70)

## 2023-10-08 MED ORDER — FUROSEMIDE 10 MG/ML IJ SOLN
20.0000 mg | Freq: Two times a day (BID) | INTRAMUSCULAR | Status: DC
  Administered 2023-10-08 (×2): 20 mg via INTRAVENOUS
  Filled 2023-10-08 (×2): qty 2

## 2023-10-08 MED ORDER — METHYLPREDNISOLONE SODIUM SUCC 125 MG IJ SOLR
125.0000 mg | Freq: Once | INTRAMUSCULAR | Status: AC
  Administered 2023-10-08: 125 mg via INTRAVENOUS
  Filled 2023-10-08: qty 2

## 2023-10-08 MED ORDER — IPRATROPIUM-ALBUTEROL 0.5-2.5 (3) MG/3ML IN SOLN: 3.0000 mL | Freq: Three times a day (TID) | RESPIRATORY_TRACT | Status: DC

## 2023-10-08 MED ORDER — PANTOPRAZOLE SODIUM 40 MG PO TBEC
40.0000 mg | DELAYED_RELEASE_TABLET | Freq: Every day | ORAL | Status: DC
  Administered 2023-10-08 – 2023-10-10 (×3): 40 mg via ORAL
  Filled 2023-10-08 (×3): qty 1

## 2023-10-08 MED ORDER — PREDNISONE 20 MG PO TABS
40.0000 mg | ORAL_TABLET | Freq: Every day | ORAL | Status: DC
  Administered 2023-10-09 – 2023-10-10 (×2): 40 mg via ORAL
  Filled 2023-10-08 (×2): qty 2

## 2023-10-08 MED ORDER — IPRATROPIUM-ALBUTEROL 0.5-2.5 (3) MG/3ML IN SOLN
3.0000 mL | Freq: Four times a day (QID) | RESPIRATORY_TRACT | Status: DC
  Administered 2023-10-08 – 2023-10-10 (×8): 3 mL via RESPIRATORY_TRACT
  Filled 2023-10-08 (×8): qty 3

## 2023-10-08 MED ORDER — POTASSIUM CHLORIDE CRYS ER 20 MEQ PO TBCR
40.0000 meq | EXTENDED_RELEASE_TABLET | ORAL | Status: AC
  Administered 2023-10-08 (×2): 40 meq via ORAL
  Filled 2023-10-08 (×2): qty 2

## 2023-10-08 MED ORDER — FUROSEMIDE 10 MG/ML IJ SOLN: 20.0000 mg | Freq: Two times a day (BID) | INTRAMUSCULAR | Status: DC

## 2023-10-08 MED ORDER — ENOXAPARIN SODIUM 40 MG/0.4ML IJ SOSY
40.0000 mg | PREFILLED_SYRINGE | INTRAMUSCULAR | Status: DC
  Administered 2023-10-08 – 2023-10-09 (×2): 40 mg via SUBCUTANEOUS
  Filled 2023-10-08 (×2): qty 0.4

## 2023-10-08 MED ORDER — MAGNESIUM SULFATE 2 GM/50ML IV SOLN
2.0000 g | Freq: Once | INTRAVENOUS | Status: AC
  Administered 2023-10-08: 2 g via INTRAVENOUS
  Filled 2023-10-08: qty 50

## 2023-10-08 NOTE — Plan of Care (Signed)

## 2023-10-08 NOTE — Progress Notes (Signed)
   Rounding Note    Patient Name: [name] Date of Encounter: 10/08/2023  [facility] Cardiologist: Hazle Lites, MD   Subjective   Feels weak this AM. Wheezing. LHC yesterday with normal coronary arteries.  Vital Signs    Vitals:   10/08/23 0646 10/08/23 0730 10/08/23 0821 10/08/23 0824  BP: (!) 95/59 (!) 91/59 99/61   Pulse: 88 83 85 86  Resp: 17 12 (!) 21 20  Temp:  99.2 F (37.3 C)    TempSrc:  Oral    SpO2: 97% 98% 96% 96%  Weight:      Height:        Intake/Output Summary (Last 24 hours) at 10/08/2023 0845 Last data filed at 10/08/2023 4098 Gross per 24 hour  Intake 190.9 ml  Output 850 ml  Net -659.1 ml      10/08/2023    4:55 AM 10/07/2023    7:01 PM 10/07/2023    6:15 PM  Last 3 Weights  Weight (lbs) 216 lb 213 lb 6.5 oz 218 lb 14.7 oz  Weight (kg) 97.977 kg 96.8 kg 99.3 kg      Telemetry    Personally Reviewed   Physical Exam   GEN: No acute distress.   Cardiac: RRR, no murmurs, rubs, or gallops.  Respiratory: Diffuse expiratory wheezing. Mild IWOB. Psych: Normal affect   Assessment & Plan    #Wheezing Just received breathing treatment. Hx of COPD. I did notify her primary physician this AM of this finding.  #Acute systolic heart failure #Takutsubo cardiomyopathy #CP Diuresis ongoing.  Labs ordered STAT this AM to assess electrolytes/renal function.     [name], MD, [facility], [facility] Cardiac Electrophysiology
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
## 2023-10-08 NOTE — Progress Notes (Signed)
 Change duoneb to TID per RT protocol assessment

## 2023-10-08 NOTE — Progress Notes (Signed)
   Notified by RN that patient has been having low BP and is on IV lasix  40 mg BID. Yesterday evening, lasix  was held due to low BP 82/53. SBP remained in the 80s-low 90s overnight. This AM, BP 99/61.   Decreased IV lasix  to 20 mg BID. Follow urine output and BP closely with this. Per cath report, she had normal cardiac index and there was hope she could tolerate diuresis. Not on other BP lowering medications. MD to see in rounds this AM. BMP pending   Debria Fang, PA-C 10/08/2023 8:38 AM

## 2023-10-08 NOTE — Assessment & Plan Note (Signed)
 10-08-2023 acute exacerbated today. Give 1 dose IV solumedrol 125 mg. Start po prednisone  40 mg daily tomorrow. Increase duonebs to QID. Close monitoring.  10-09-2023 pt has home neb machine at bedside. Pt wants to go home. On po prednisone . Breathing much better now.  10-10-2023 DC to home with prednisone  and nebs.
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
## 2023-10-08 NOTE — Progress Notes (Signed)
 RNCM received DME order for nebulizer machine.  Jermaine at [facility] contacted with order and confirmation received.  DME to be delivered to patient's room prior to discharge home.

## 2023-10-09 DIAGNOSIS — J441 Chronic obstructive pulmonary disease with (acute) exacerbation: Secondary | ICD-10-CM | POA: Diagnosis not present

## 2023-10-09 DIAGNOSIS — I5181 Takotsubo syndrome: Secondary | ICD-10-CM | POA: Diagnosis not present

## 2023-10-09 DIAGNOSIS — N1832 Chronic kidney disease, stage 3b: Secondary | ICD-10-CM | POA: Diagnosis not present

## 2023-10-09 DIAGNOSIS — I214 Non-ST elevation (NSTEMI) myocardial infarction: Secondary | ICD-10-CM | POA: Diagnosis not present

## 2023-10-09 LAB — BASIC METABOLIC PANEL WITH GFR
Anion gap: 9 (ref 5–15)
BUN: 26 mg/dL — ABNORMAL HIGH (ref 8–23)
CO2: 21 mmol/L — ABNORMAL LOW (ref 22–32)
Calcium: 8.7 mg/dL — ABNORMAL LOW (ref 8.9–10.3)
Chloride: 106 mmol/L (ref 98–111)
Creatinine, Ser: 1.92 mg/dL — ABNORMAL HIGH (ref 0.44–1.00)
GFR, Estimated: 28 mL/min — ABNORMAL LOW (ref >60)
Glucose, Bld: 161 mg/dL — ABNORMAL HIGH (ref 70–99)
Potassium: 4.6 mmol/L (ref 3.5–5.1)
Sodium: 136 mmol/L (ref 135–145)

## 2023-10-09 LAB — CBC
HCT: 33.9 % — ABNORMAL LOW (ref 36.0–46.0)
Hemoglobin: 11.1 g/dL — ABNORMAL LOW (ref 12.0–15.0)
MCH: 31 pg (ref 26.0–34.0)
MCHC: 32.7 g/dL (ref 30.0–36.0)
MCV: 94.7 fL (ref 80.0–100.0)
Platelets: 114 10*3/uL — ABNORMAL LOW (ref 150–400)
RBC: 3.58 MIL/uL — ABNORMAL LOW (ref 3.87–5.11)
RDW: 13.9 % (ref 11.5–15.5)
WBC: 3.3 10*3/uL — ABNORMAL LOW (ref 4.0–10.5)
nRBC: 0 % (ref 0.0–0.2)

## 2023-10-09 LAB — LIPOPROTEIN A (LPA): Lipoprotein (a): 15.4 nmol/L (ref <75.0)

## 2023-10-09 LAB — MAGNESIUM: Magnesium: 2.4 mg/dL (ref 1.7–2.4)

## 2023-10-09 MED ORDER — GUAIFENESIN ER 600 MG PO TB12
600.0000 mg | ORAL_TABLET | Freq: Two times a day (BID) | ORAL | Status: DC
  Administered 2023-10-09 – 2023-10-10 (×2): 600 mg via ORAL
  Filled 2023-10-09 (×2): qty 1
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
## 2023-10-09 NOTE — Plan of Care (Signed)
  Problem: Activity: Goal: Risk for activity intolerance will decrease Outcome: Progressing   Problem: Pain Managment: Goal: General experience of comfort will improve and/or be controlled Outcome: Progressing

## 2023-10-09 NOTE — Progress Notes (Signed)
   Rounding Note    Patient Name: [name] Date of Encounter: 10/09/2023  [facility] Cardiologist: Hazle Lites, MD   Subjective   Breathing is better this morning.  Up in chair.  Vital Signs    Vitals:   10/08/23 2348 10/09/23 0520 10/09/23 0750 10/09/23 0818  BP: 102/62 104/67  114/70  Pulse: 81 84 82 92  Resp: 16 16 17 17   Temp: 98.1 F (36.7 C) 98.4 F (36.9 C)  98.1 F (36.7 C)  TempSrc: Oral Oral  Oral  SpO2: 97% 93% 94% 96%  Weight:  97.7 kg    Height:        Intake/Output Summary (Last 24 hours) at 10/09/2023 0841 Last data filed at 10/09/2023 0700 Gross per 24 hour  Intake 476 ml  Output --  Net 476 ml      10/09/2023    5:20 AM 10/08/2023    4:55 AM 10/07/2023    7:01 PM  Last 3 Weights  Weight (lbs) 215 lb 4.8 oz 216 lb 213 lb 6.5 oz  Weight (kg) 97.659 kg 97.977 kg 96.8 kg      Telemetry    Personally Reviewed   Physical Exam   GEN: No acute distress.   Cardiac: RRR, no murmurs, rubs, or gallops.  Respiratory: Diffuse expiratory wheezing. Mild IWOB. Psych: Normal affect   Assessment & Plan    #Wheezing Improving.  Hospitalist is treating for COPD exacerbation.  #Acute systolic heart failure #Takutsubo cardiomyopathy #CP Holding diuresis given slight elevation in creatinine. I have recommended an additional day of monitoring prior to discharge given her only now stabilized respiratory status and elevated creatinine.    [name], MD, [facility], [facility] Cardiac Electrophysiology

## 2023-10-10 ENCOUNTER — Other Ambulatory Visit (HOSPITAL_COMMUNITY): Payer: Self-pay

## 2023-10-10 ENCOUNTER — Encounter (HOSPITAL_COMMUNITY): Payer: Self-pay | Admitting: Cardiology

## 2023-10-10 DIAGNOSIS — I5181 Takotsubo syndrome: Secondary | ICD-10-CM | POA: Diagnosis not present

## 2023-10-10 DIAGNOSIS — I2489 Other forms of acute ischemic heart disease: Principal | ICD-10-CM

## 2023-10-10 DIAGNOSIS — J441 Chronic obstructive pulmonary disease with (acute) exacerbation: Secondary | ICD-10-CM | POA: Diagnosis not present

## 2023-10-10 DIAGNOSIS — N1832 Chronic kidney disease, stage 3b: Secondary | ICD-10-CM | POA: Diagnosis not present

## 2023-10-10 LAB — CBC
HCT: 30.8 % — ABNORMAL LOW (ref 36.0–46.0)
Hemoglobin: 10.1 g/dL — ABNORMAL LOW (ref 12.0–15.0)
MCH: 31.6 pg (ref 26.0–34.0)
MCHC: 32.8 g/dL (ref 30.0–36.0)
MCV: 96.3 fL (ref 80.0–100.0)
Platelets: 100 10*3/uL — ABNORMAL LOW (ref 150–400)
RBC: 3.2 MIL/uL — ABNORMAL LOW (ref 3.87–5.11)
RDW: 14 % (ref 11.5–15.5)
WBC: 6.4 10*3/uL (ref 4.0–10.5)
nRBC: 0 % (ref 0.0–0.2)

## 2023-10-10 LAB — BASIC METABOLIC PANEL WITH GFR
Anion gap: 5 (ref 5–15)
BUN: 31 mg/dL — ABNORMAL HIGH (ref 8–23)
CO2: 24 mmol/L (ref 22–32)
Calcium: 8.9 mg/dL (ref 8.9–10.3)
Chloride: 107 mmol/L (ref 98–111)
Creatinine, Ser: 1.93 mg/dL — ABNORMAL HIGH (ref 0.44–1.00)
GFR, Estimated: 28 mL/min — ABNORMAL LOW (ref >60)
Glucose, Bld: 133 mg/dL — ABNORMAL HIGH (ref 70–99)
Potassium: 4.8 mmol/L (ref 3.5–5.1)
Sodium: 136 mmol/L (ref 135–145)

## 2023-10-10 LAB — MAGNESIUM: Magnesium: 2.3 mg/dL (ref 1.7–2.4)

## 2023-10-10 MED ORDER — DAPAGLIFLOZIN PROPANEDIOL 10 MG PO TABS
10.0000 mg | ORAL_TABLET | Freq: Every day | ORAL | 3 refills | Status: AC
  Filled 2023-10-10 (×2): qty 30, 30d supply, fill #0

## 2023-10-10 MED ORDER — GUAIFENESIN ER 600 MG PO TB12
600.0000 mg | ORAL_TABLET | Freq: Two times a day (BID) | ORAL | 0 refills | Status: AC
  Filled 2023-10-10: qty 20, 10d supply, fill #0

## 2023-10-10 MED ORDER — PREDNISONE 20 MG PO TABS
40.0000 mg | ORAL_TABLET | Freq: Every day | ORAL | 0 refills | Status: AC
  Filled 2023-10-10: qty 14, 7d supply, fill #0

## 2023-10-10 MED ORDER — CARVEDILOL 3.125 MG PO TABS
3.1250 mg | ORAL_TABLET | Freq: Two times a day (BID) | ORAL | Status: DC
  Administered 2023-10-10: 3.125 mg via ORAL
  Filled 2023-10-10: qty 1

## 2023-10-10 MED ORDER — CARVEDILOL 3.125 MG PO TABS: 3.1250 mg | ORAL_TABLET | Freq: Two times a day (BID) | ORAL | 0 refills | Status: DC

## 2023-10-10 MED ORDER — IPRATROPIUM-ALBUTEROL 0.5-2.5 (3) MG/3ML IN SOLN
RESPIRATORY_TRACT | 0 refills | Status: AC
  Filled 2023-10-10: qty 120, 10d supply, fill #0

## 2023-10-10 MED ORDER — PREDNISONE 20 MG PO TABS: 40.0000 mg | ORAL_TABLET | Freq: Every day | ORAL | 0 refills | Status: DC

## 2023-10-10 MED ORDER — ATORVASTATIN CALCIUM 80 MG PO TABS: 80.0000 mg | ORAL_TABLET | Freq: Every day | ORAL | 0 refills | Status: DC

## 2023-10-10 MED ORDER — DAPAGLIFLOZIN PROPANEDIOL 10 MG PO TABS
10.0000 mg | ORAL_TABLET | Freq: Every day | ORAL | Status: DC
  Administered 2023-10-10: 10 mg via ORAL
  Filled 2023-10-10: qty 1

## 2023-10-10 MED ORDER — DAPAGLIFLOZIN PROPANEDIOL 10 MG PO TABS: 10.0000 mg | ORAL_TABLET | Freq: Every day | ORAL | 0 refills | Status: DC

## 2023-10-10 MED ORDER — IPRATROPIUM-ALBUTEROL 0.5-2.5 (3) MG/3ML IN SOLN: RESPIRATORY_TRACT | 0 refills | Status: DC

## 2023-10-10 MED ORDER — GUAIFENESIN ER 600 MG PO TB12: 600.0000 mg | ORAL_TABLET | Freq: Two times a day (BID) | ORAL | 0 refills | Status: DC

## 2023-10-10 NOTE — Progress Notes (Signed)
 Heart Failure Navigator Progress Note  Assessed for Heart & Vascular TOC clinic readiness.  Patient does not meet criteria due to has a scheduled [facility] appointment on 10/17/2023. .   Navigator will sign off at this time.   Randie Bustle, BSN, Scientist, clinical (histocompatibility and immunogenetics) Only
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
## 2023-10-10 NOTE — TOC CM/SW Note (Signed)
 Transition of Care [facility]) - Inpatient Brief Assessment   Patient Details  Name: [name] MRN: [id number] Date of Birth: 19-Jun-1955  Transition of Care [facility]) CM/SW Contact:    Cosimo Diones, RN Phone Number: 10/10/2023, 10:20 AM   Clinical Narrative: Patient presented for Nstemi. Referral submitted for nebulizer machine for home. Case Manager spoke with patient and nebulizer is in the room- states Dr. Farrel Hones dropped it off. Not sure what agency delivered the DME. Patient states she has a ride home. No further needs identified.    Transition of Care Asessment: Insurance and Status: Insurance coverage has been reviewed Patient has primary care physician: Yes Home environment has been reviewed: reviewed Prior level of function:: indpendent Prior/Current Home Services: No current home services Social Drivers of Health Review: SDOH reviewed no interventions necessary Readmission risk has been reviewed: Yes Transition of care needs: no transition of care needs at this time

## 2023-10-10 NOTE — Care Management Important Message (Signed)
 Important Message  Patient Details  Name: [name] MRN: [id number] Date of Birth: 25-Apr-1956   Important Message Given:  Yes - Medicare IM     Janith Melnick 10/10/2023, 10:05 AM

## 2023-10-10 NOTE — Progress Notes (Signed)
 Reviewed AVS, patient expressed understanding of medications, MD follow up reviewed.   Removed IV, Site clean, dry and intact.  Patient states all belongings brought to the hospital at time of admission are accounted for and packed to take home.  Picked up medications from [facility] pharmacy on the way to discharge lounge; Pt transported to Discharge lounge to wait for transportation home.
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
## 2023-10-11 ENCOUNTER — Telehealth: Payer: Self-pay | Admitting: Pharmacy Technician

## 2023-10-11 NOTE — Telephone Encounter (Signed)
 Hi, scanned in media is the patient's farxiga patient assistance application if someone can please get the provider to sign and fax back to us  at [phone]? Thank you!

## 2023-10-11 NOTE — Telephone Encounter (Signed)
 Please print and sign application and send back to Patient assistance team

## 2023-10-13 ENCOUNTER — Telehealth (HOSPITAL_COMMUNITY): Payer: Self-pay

## 2023-10-13 DIAGNOSIS — I214 Non-ST elevation (NSTEMI) myocardial infarction: Secondary | ICD-10-CM

## 2023-10-13 NOTE — Telephone Encounter (Signed)
 Hi, just checking on provider portion. Thank you!

## 2023-10-13 NOTE — Telephone Encounter (Signed)
 Attempted to call pt to inform her of cardiac rehab at [facility] however pt did not pick up and I was unable to leave a message. Will send message via Epic and mail information to her.
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
## 2023-10-17 ENCOUNTER — Encounter: Payer: Self-pay | Admitting: Physician Assistant

## 2023-10-17 ENCOUNTER — Ambulatory Visit: Attending: Physician Assistant | Admitting: Physician Assistant

## 2023-10-17 VITALS — BP 130/76 | HR 78 | Ht 66.0 in | Wt 217.8 lb

## 2023-10-17 DIAGNOSIS — J449 Chronic obstructive pulmonary disease, unspecified: Secondary | ICD-10-CM | POA: Diagnosis not present

## 2023-10-17 DIAGNOSIS — N1832 Chronic kidney disease, stage 3b: Secondary | ICD-10-CM | POA: Diagnosis present

## 2023-10-17 DIAGNOSIS — I428 Other cardiomyopathies: Secondary | ICD-10-CM | POA: Diagnosis not present

## 2023-10-17 DIAGNOSIS — E782 Mixed hyperlipidemia: Secondary | ICD-10-CM | POA: Insufficient documentation

## 2023-10-17 DIAGNOSIS — I10 Essential (primary) hypertension: Secondary | ICD-10-CM | POA: Insufficient documentation

## 2023-10-17 DIAGNOSIS — I5181 Takotsubo syndrome: Secondary | ICD-10-CM | POA: Diagnosis not present

## 2023-10-17 MED ORDER — FUROSEMIDE 40 MG PO TABS: ORAL_TABLET | ORAL | 3 refills | Status: AC

## 2023-10-17 MED ORDER — METOPROLOL SUCCINATE ER 25 MG PO TB24
25.0000 mg | ORAL_TABLET | Freq: Every day | ORAL | 3 refills | Status: DC
Start: 2023-10-17 — End: 2023-10-25

## 2023-10-17 NOTE — Patient Instructions (Signed)
 Medication Instructions:  furosemide  (LASIX ) 40 MG tablet Take 40 mg (1 tablet) twice daily for five days, then once daily in the morning.   Take the metoprolol  succinate 25mg  1 tablet (25 mg total) by mouth at bedtime.   A referral has been placed for [name] to [facility], DANIEL C,    *If you need a refill on your cardiac medications before your next appointment, please call your pharmacy*   Lab Work: Labs will be drawn today...............[name]  If you have labs (blood work) drawn today and your tests are completely normal, you will receive your results only by: MyChart Message (if you have MyChart) OR A paper copy in the mail If you have any lab test that is abnormal or we need to change your treatment, we will call you to review the results.   Testing/Procedures: No procedures were ordered during today's visit.    Follow-Up: At [facility], you and your health needs are our priority.  As part of our continuing mission to provide you with exceptional heart care, we have created designated Provider Care Teams.  These Care Teams include your primary Cardiologist (physician) and Advanced Practice Providers (APPs -  Physician Assistants and Nurse Practitioners) who all work together to provide you with the care you need, when you need it.  We recommend signing up for the patient portal called MyChart.  Sign up information is provided on this After Visit Summary.  MyChart is used to connect with patients for Virtual Visits (Telemedicine).  Patients are able to view lab/test results, encounter notes, upcoming appointments, etc.  Non-urgent messages can be sent to your provider as well.   To learn more about what you can do with MyChart, go to [email or web address].

## 2023-10-18 ENCOUNTER — Ambulatory Visit: Payer: Self-pay | Admitting: Physician Assistant

## 2023-10-18 LAB — BASIC METABOLIC PANEL WITH GFR
BUN/Creatinine Ratio: 16 (ref 12–28)
BUN: 26 mg/dL (ref 8–27)
CO2: 22 mmol/L (ref 20–29)
Calcium: 9.3 mg/dL (ref 8.7–10.3)
Chloride: 101 mmol/L (ref 96–106)
Creatinine, Ser: 1.59 mg/dL — ABNORMAL HIGH (ref 0.57–1.00)
Glucose: 128 mg/dL — ABNORMAL HIGH (ref 70–99)
Potassium: 4.1 mmol/L (ref 3.5–5.2)
Sodium: 139 mmol/L (ref 134–144)
eGFR: 36 mL/min/{1.73_m2} — ABNORMAL LOW (ref >59)

## 2023-10-18 LAB — BRAIN NATRIURETIC PEPTIDE: BNP: 162.3 pg/mL — ABNORMAL HIGH (ref 0.0–100.0)

## 2023-10-24 NOTE — Telephone Encounter (Signed)
 Hi, just checking on provider portion. Thank you!

## 2023-10-24 NOTE — Progress Notes (Signed)
 "     OFFICE NOTE:    Date:  10/25/2023  ID:  [name], DOB [birth year], MRN [id number] PCP: Patient, No Pcp Per   [facility] Providers Cardiologist:  Vinie JAYSON Maxcy, MD       Patient Profile:  Tako-tsubo CM, recurrent (2010, 2018, 2025) LHC 05/18/16: no CAD  TTE 05/18/16: EF 25-30  TTE 11/26/16: EF 55-60  CCTA 12/27/18: CAC 0, no CAD  TTE 12/27/18: EF 45-50  TTE 03/10/22: EF 50-55 LHC 10/07/23: no CAD, mean PAP 37, mean PCWP 30 TTE 10/07/23: EF 35-40, NL RVSF, mild LAE, AV sclerosis Chronic Obstructive Pulmonary Disease  Tobacco use  Hypertension  Hyperlipidemia  Chronic kidney disease Hx of GI bleed        Discussed the use of AI scribe software for clinical note transcription with the patient, who gave verbal consent to proceed. History of Present Illness [name] is a 68 y.o. female who returns for follow up of CHF. She was admitted in 10/2023 with a NSTEMI. Her hsTroponin increased to > 3000. Cath showed no CAD and EF was down at 35-40 and WM c/w stress induced CM. Filling pressures were elevated. She developed AKI and ACE, loop diuretic were placed on hold at DC. She was started on SGLT2i. She was seen by Jon Hails, PA-C for post hosp follow up. Furosemide  was restarted due to volume overload. Coreg  was changed to Metoprolol  succinate for soft BP. She was referred to pulmonology for COPD.  She is here today with her son.  She feels much better after resuming furosemide .  She has much less short of breath.  She has not had orthopnea, PND or significant lower extremity edema.  She had significant palpitations after starting on metoprolol  succinate.  She stopped this and went back to carvedilol  3.125 mg twice daily without side effects.  She has not had chest discomfort, syncope.    ROS-See HPI    Studies Reviewed:      Results LABS K: 4.1 (10/17/2023) Cr: 1.59 (10/17/2023) Cr: 1.93 (10/10/2023)  Risk Assessment/Calculations:          Physical Exam:   VS:  BP 130/62   Pulse 64   Ht 5' 6 (1.676 m)   Wt 206 lb (93.4 kg)   SpO2 96%   BMI 33.25 kg/m        Wt Readings from Last 3 Encounters:  10/25/23 206 lb (93.4 kg)  10/17/23 217 lb 12.8 oz (98.8 kg)  10/10/23 216 lb 4.8 oz (98.1 kg)    Constitutional:      Appearance: Healthy appearance. Not in distress.  Neck:     Vascular: JVD normal.  Pulmonary:     Breath sounds: Normal breath sounds. No wheezing. No rales.  Cardiovascular:     Normal rate. Regular rhythm.     Murmurs: There is no murmur.  Edema:    Peripheral edema absent.  Abdominal:     Palpations: Abdomen is soft.        Assessment and Plan:    Assessment & Plan HFrEF (heart failure with reduced ejection fraction) (HCC) Stress-induced cardiomyopathy Hx of recurrent stress induced CM in 2010, 2018 and most recently 10/2023.  She presented with a non-STEMI with troponins over 3000.  Cardiac cath demonstrated no CAD.  EF was 35-40 by echocardiogram with wall motion abnormalities consistent with stress-induced cardiomyopathy.  She had AKI with diuresis as well as soft blood pressures.  At last visit, her carvedilol  was changed  to metoprolol  succinate and she was placed back on her furosemide .  She is feeling much better.  Volume status is stable.  She is NYHA II-IIb.  She had significant side effects to metoprolol  succinate and resumed her carvedilol . -Continue carvedilol  3.125 mg twice daily, Farxiga  10 mg daily, furosemide  40 mg daily -Repeat BMET today -If renal function stable, restart lisinopril  5 mg daily -Limited echo 01/2024 to recheck EF -Follow-up 01/2024 Essential hypertension Blood pressure controlled.  Continue carvedilol  3.125 mg twice daily.  Obtain follow-up BMET today and resume lisinopril  if renal function stable. CKD stage 3b, GFR 30-44 ml/min (HCC) - Baseline scr 1.6-1.7 Repeat BMET today.       Dispo:  Return in about 3 months (around 01/25/2024) for DR. HILTY 1 WEEK AFTER LIMITED  ECHO.  Signed, Glendia Ferrier, PA-C   "

## 2023-10-25 ENCOUNTER — Encounter: Payer: Self-pay | Admitting: Physician Assistant

## 2023-10-25 ENCOUNTER — Ambulatory Visit: Attending: Physician Assistant | Admitting: Physician Assistant

## 2023-10-25 VITALS — BP 130/62 | HR 64 | Ht 66.0 in | Wt 206.0 lb

## 2023-10-25 DIAGNOSIS — I10 Essential (primary) hypertension: Secondary | ICD-10-CM | POA: Diagnosis present

## 2023-10-25 DIAGNOSIS — I5181 Takotsubo syndrome: Secondary | ICD-10-CM | POA: Diagnosis present

## 2023-10-25 DIAGNOSIS — N1832 Chronic kidney disease, stage 3b: Secondary | ICD-10-CM | POA: Insufficient documentation

## 2023-10-25 DIAGNOSIS — I5020 Unspecified systolic (congestive) heart failure: Secondary | ICD-10-CM | POA: Insufficient documentation

## 2023-10-25 MED ORDER — CARVEDILOL 3.125 MG PO TABS: 3.1250 mg | ORAL_TABLET | Freq: Two times a day (BID) | ORAL | 3 refills | Status: AC

## 2023-10-25 NOTE — Assessment & Plan Note (Signed)
 Hx of recurrent stress induced CM in 2010, 2018 and most recently 10/2023.  She presented with a non-STEMI with troponins over 3000.  Cardiac cath demonstrated no CAD.  EF was 35-40 by echocardiogram with wall motion abnormalities consistent with stress-induced cardiomyopathy.  She had AKI with diuresis as well as soft blood pressures.  At last visit, her carvedilol  was changed to metoprolol  succinate and she was placed back on her furosemide .  She is feeling much better.  Volume status is stable.  She is NYHA II-IIb.  She had significant side effects to metoprolol  succinate and resumed her carvedilol . -Continue carvedilol  3.125 mg twice daily, Farxiga  10 mg daily, furosemide  40 mg daily -Repeat BMET today -If renal function stable, restart lisinopril  5 mg daily -Limited echo 01/2024 to recheck EF -Follow-up 01/2024

## 2023-10-25 NOTE — Telephone Encounter (Signed)
 Hi, just following up on the signature of the application for the patient. We received another application from the patient today from the inpatient pharmacy, just missing the provider signature. We never got the provider signature page from 10/18/23. I have scanned in media the application we received today from inpatient with the patient signature, if that could be signed please or if the one from 10/18/23 is signed that would work too. Please fax back to us  at [phone]. Thank you!

## 2023-10-25 NOTE — Assessment & Plan Note (Signed)
 Blood pressure controlled.  Continue carvedilol  3.125 mg twice daily.  Obtain follow-up BMET today and resume lisinopril  if renal function stable.

## 2023-10-25 NOTE — Telephone Encounter (Signed)
     I faxed the prescription at 12:36p and fax confirmed at 12:51pm. I called [employer] and they said it takes 2 days to process faxes. The fax today had the prescription on there. They were going off a fax they got back originally on 09/09/23. She told me to call in 2 days to see if they got it

## 2023-10-25 NOTE — Assessment & Plan Note (Signed)
Repeat BMET today.  

## 2023-10-25 NOTE — Patient Instructions (Signed)
 Medication Instructions:  Your physician recommends that you continue on your current medications as directed. Please refer to the Current Medication list given to you today.  *If you need a refill on your cardiac medications before your next appointment, please call your pharmacy*  Lab Work: TODAY:  BMET  If you have labs (blood work) drawn today and your tests are completely normal, you will receive your results only by: MyChart Message (if you have MyChart) OR A paper copy in the mail If you have any lab test that is abnormal or we need to change your treatment, we will call you to review the results.  Testing/Procedures: Your physician has requested that you have an limited echocardiogram IN [place]. Echocardiography is a painless test that uses sound waves to create images of your heart. It provides your doctor with information about the size and shape of your heart and how well your heart's chambers and valves are working. This procedure takes approximately one hour. There are no restrictions for this procedure. Please do NOT wear cologne, perfume, aftershave, or lotions (deodorant is allowed). Please arrive 15 minutes prior to your appointment time.  Please note: We ask at that you not bring children with you during ultrasound (echo/ vascular) testing. Due to room size and safety concerns, children are not allowed in the ultrasound rooms during exams. Our front office staff cannot provide observation of children in our lobby area while testing is being conducted. An adult accompanying a patient to their appointment will only be allowed in the ultrasound room at the discretion of the ultrasound technician under special circumstances. We apologize for any inconvenience.   Follow-Up: At [facility], you and your health needs are our priority.  As part of our continuing mission to provide you with exceptional heart care, our providers are all part of one team.  This team includes  your primary Cardiologist (physician) and Advanced Practice Providers or APPs (Physician Assistants and Nurse Practitioners) who all work together to provide you with the care you need, when you need it.  Your next appointment:   3 month(s) IN SEPTEMBER 1 WEEK AFTER ECHOCARDIOGRAM  Provider:   Vinie JAYSON Maxcy, MD    We recommend signing up for the patient portal called MyChart.  Sign up information is provided on this After Visit Summary.  MyChart is used to connect with patients for Virtual Visits (Telemedicine).  Patients are able to view lab/test results, encounter notes, upcoming appointments, etc.  Non-urgent messages can be sent to your provider as well.   To learn more about what you can do with MyChart, go to [email or web address].   Other Instructions

## 2023-10-25 NOTE — Telephone Encounter (Signed)
 PAP: Application for Marcelline Deist has been submitted to [employer] ([employer]), via fax

## 2023-10-26 ENCOUNTER — Ambulatory Visit: Payer: Self-pay | Admitting: Physician Assistant

## 2023-10-26 DIAGNOSIS — I5020 Unspecified systolic (congestive) heart failure: Secondary | ICD-10-CM

## 2023-10-26 DIAGNOSIS — I10 Essential (primary) hypertension: Secondary | ICD-10-CM

## 2023-10-26 DIAGNOSIS — N1832 Chronic kidney disease, stage 3b: Secondary | ICD-10-CM

## 2023-10-26 LAB — BASIC METABOLIC PANEL WITH GFR
BUN/Creatinine Ratio: 11 — ABNORMAL LOW (ref 12–28)
BUN: 16 mg/dL (ref 8–27)
CO2: 22 mmol/L (ref 20–29)
Calcium: 8.9 mg/dL (ref 8.7–10.3)
Chloride: 102 mmol/L (ref 96–106)
Creatinine, Ser: 1.42 mg/dL — ABNORMAL HIGH (ref 0.57–1.00)
Glucose: 108 mg/dL — ABNORMAL HIGH (ref 70–99)
Potassium: 3.8 mmol/L (ref 3.5–5.2)
Sodium: 139 mmol/L (ref 134–144)
eGFR: 41 mL/min/{1.73_m2} — ABNORMAL LOW (ref >59)

## 2023-10-27 NOTE — Telephone Encounter (Signed)
 I called [employer] and they said they still can't read the prescription. Faxed again in another format 10/27/2023 9:51am

## 2023-11-01 NOTE — Telephone Encounter (Signed)
 I called they received the prescription and they said the patient should receive medication in 10-14 days. I called the patient and left her a message

## 2023-11-02 ENCOUNTER — Telehealth: Payer: Self-pay

## 2023-11-02 NOTE — Telephone Encounter (Signed)
 Received a fax notice in the [facility] [employer] Patient [employer] que that FARXIGA  shipment should arrive within 7-10 business days (see chart media).

## 2023-12-30 ENCOUNTER — Telehealth: Payer: Self-pay | Admitting: Pharmacy Technician

## 2023-12-30 NOTE — Telephone Encounter (Signed)
 Received notification from [employer] Farxiga  will be delivered:

## 2024-01-09 ENCOUNTER — Encounter (HOSPITAL_COMMUNITY): Payer: Self-pay | Admitting: Physician Assistant

## 2024-01-09 ENCOUNTER — Ambulatory Visit (HOSPITAL_COMMUNITY): Attending: Internal Medicine

## 2024-01-11 ENCOUNTER — Encounter: Payer: Self-pay | Admitting: Internal Medicine

## 2024-01-11 ENCOUNTER — Ambulatory Visit (INDEPENDENT_AMBULATORY_CARE_PROVIDER_SITE_OTHER): Admitting: Internal Medicine

## 2024-01-11 VITALS — BP 132/64 | HR 78 | Temp 98.6°F | Ht 66.0 in | Wt 208.8 lb

## 2024-01-11 DIAGNOSIS — J4489 Other specified chronic obstructive pulmonary disease: Secondary | ICD-10-CM | POA: Diagnosis not present

## 2024-01-11 DIAGNOSIS — J439 Emphysema, unspecified: Secondary | ICD-10-CM

## 2024-01-11 DIAGNOSIS — I5181 Takotsubo syndrome: Secondary | ICD-10-CM | POA: Diagnosis not present

## 2024-01-11 DIAGNOSIS — Z87891 Personal history of nicotine dependence: Secondary | ICD-10-CM | POA: Diagnosis not present

## 2024-01-11 MED ORDER — BREZTRI AEROSPHERE 160-9-4.8 MCG/ACT IN AERO: 2.0000 | INHALATION_SPRAY | Freq: Two times a day (BID) | RESPIRATORY_TRACT | 11 refills | Status: DC

## 2024-01-11 MED ORDER — COVID-19 MRNA VAC-TRIS(PFIZER) 30 MCG/0.3ML IM SUSY: 0.3000 mL | PREFILLED_SYRINGE | Freq: Once | INTRAMUSCULAR | 0 refills | Status: AC
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
## 2024-01-11 NOTE — Patient Instructions (Signed)
 It was a pleasure to see you today!  Please schedule follow up with myself in 2 months.  If my schedule is not open yet, we will contact you with a reminder closer to that time. Please call [phone] if you haven't heard from us  a month before, and always call us  sooner if issues or concerns arise. You can also send us  a message through MyChart, but but aware that this is not to be used for urgent issues and it may take up to 5-7 days to receive a reply. Please be aware that you will likely be able to view your results before I have a chance to respond to them. Please give us  5 business days to respond to any non-urgent results.    Before your next visit I would like you to have:  Full set of PFTs  VISIT SUMMARY:  Today, you were seen for your chronic obstructive pulmonary disease (COPD) and heart issues. You have been experiencing worsening symptoms of COPD, including shortness of breath, cough, and wheezing. We discussed your recent hospitalization and the challenges you have faced since then. We also reviewed your history of stress-induced cardiomyopathy and heart failure.  YOUR PLAN:  -CHRONIC OBSTRUCTIVE PULMONARY DISEASE (COPD): COPD is a chronic lung disease that makes it hard to breathe. You are experiencing a flare-up of your COPD symptoms. We will order pulmonary function testing to assess the severity of your COPD. You have been prescribed a new inhaler, Breztri , to use two puffs twice a day, and remember to gargle after using it. We will follow up in two months to reassess your symptoms and review the test results. Based on the test results, we will also discuss if you are eligible for cardiopulmonary rehab.  -HEART FAILURE WITH PRESERVED EJECTION FRACTION (HFPEF): Heart failure with preserved ejection fraction means your heart has difficulty relaxing and filling with blood properly. Your fluid status is well-managed, and there is no significant weight gain or leg swelling. We will  ensure you have a follow-up echocardiogram before your next appointment and coordinate with cardiology for ongoing management of your heart condition.  INSTRUCTIONS:  Please schedule a follow-up appointment in two months. Ensure you complete the pulmonary function testing and echocardiogram before your next visit. Continue using your new Breztri  inhaler as prescribed and gargle after each use. We will review your test results and discuss further steps at your next appointment.

## 2024-01-12 ENCOUNTER — Encounter: Payer: Self-pay | Admitting: Internal Medicine

## 2024-01-17 ENCOUNTER — Ambulatory Visit: Admitting: Internal Medicine

## 2024-01-20 MED ORDER — BREZTRI AEROSPHERE 160-9-4.8 MCG/ACT IN AERO: 2.0000 | INHALATION_SPRAY | Freq: Two times a day (BID) | RESPIRATORY_TRACT | Status: DC

## 2024-01-20 MED ORDER — BREZTRI AEROSPHERE 160-9-4.8 MCG/ACT IN AERO: 2.0000 | INHALATION_SPRAY | Freq: Two times a day (BID) | RESPIRATORY_TRACT | 3 refills | Status: DC

## 2024-01-20 NOTE — Addendum Note (Signed)
 Addended by: MELVENIA POSEY R on: 01/20/2024 01:24 PM   Modules accepted: Orders

## 2024-01-20 NOTE — Addendum Note (Signed)
 Addended by: MELVENIA POSEY R on: 01/20/2024 12:07 PM   Modules accepted: Orders

## 2024-02-14 ENCOUNTER — Telehealth: Payer: Self-pay | Admitting: Pharmacy Technician

## 2024-02-14 NOTE — Telephone Encounter (Signed)
 Received notification from [employer] that patient is approved- 05/03/24- 05/02/25 . Attached in media

## 2024-03-05 ENCOUNTER — Emergency Department (HOSPITAL_COMMUNITY)

## 2024-03-05 ENCOUNTER — Encounter (HOSPITAL_COMMUNITY): Payer: Self-pay

## 2024-03-05 ENCOUNTER — Inpatient Hospital Stay (HOSPITAL_COMMUNITY)
Admission: EM | Admit: 2024-03-05 | Discharge: 2024-03-08 | DRG: 439 | Disposition: A | Discharge status: Home / Self Care | Attending: Internal Medicine | Admitting: Internal Medicine

## 2024-03-05 ENCOUNTER — Inpatient Hospital Stay (HOSPITAL_COMMUNITY)

## 2024-03-05 ENCOUNTER — Other Ambulatory Visit: Payer: Self-pay

## 2024-03-05 DIAGNOSIS — Z801 Family history of malignant neoplasm of trachea, bronchus and lung: Secondary | ICD-10-CM | POA: Diagnosis not present

## 2024-03-05 DIAGNOSIS — Z8249 Family history of ischemic heart disease and other diseases of the circulatory system: Secondary | ICD-10-CM | POA: Diagnosis not present

## 2024-03-05 DIAGNOSIS — J449 Chronic obstructive pulmonary disease, unspecified: Secondary | ICD-10-CM | POA: Diagnosis present

## 2024-03-05 DIAGNOSIS — I130 Hypertensive heart and chronic kidney disease with heart failure and stage 1 through stage 4 chronic kidney disease, or unspecified chronic kidney disease: Secondary | ICD-10-CM | POA: Diagnosis present

## 2024-03-05 DIAGNOSIS — K858 Other acute pancreatitis without necrosis or infection: Principal | ICD-10-CM | POA: Diagnosis present

## 2024-03-05 DIAGNOSIS — D649 Anemia, unspecified: Secondary | ICD-10-CM

## 2024-03-05 DIAGNOSIS — K859 Acute pancreatitis without necrosis or infection, unspecified: Secondary | ICD-10-CM | POA: Diagnosis present

## 2024-03-05 DIAGNOSIS — I428 Other cardiomyopathies: Secondary | ICD-10-CM | POA: Diagnosis present

## 2024-03-05 DIAGNOSIS — E66811 Obesity, class 1: Secondary | ICD-10-CM | POA: Diagnosis present

## 2024-03-05 DIAGNOSIS — Z23 Encounter for immunization: Secondary | ICD-10-CM | POA: Diagnosis not present

## 2024-03-05 DIAGNOSIS — K219 Gastro-esophageal reflux disease without esophagitis: Secondary | ICD-10-CM | POA: Diagnosis present

## 2024-03-05 DIAGNOSIS — Z803 Family history of malignant neoplasm of breast: Secondary | ICD-10-CM | POA: Diagnosis not present

## 2024-03-05 DIAGNOSIS — E782 Mixed hyperlipidemia: Secondary | ICD-10-CM | POA: Diagnosis not present

## 2024-03-05 DIAGNOSIS — R7989 Other specified abnormal findings of blood chemistry: Secondary | ICD-10-CM

## 2024-03-05 DIAGNOSIS — K7460 Unspecified cirrhosis of liver: Secondary | ICD-10-CM | POA: Diagnosis present

## 2024-03-05 DIAGNOSIS — D638 Anemia in other chronic diseases classified elsewhere: Secondary | ICD-10-CM | POA: Diagnosis present

## 2024-03-05 DIAGNOSIS — I5020 Unspecified systolic (congestive) heart failure: Secondary | ICD-10-CM | POA: Diagnosis not present

## 2024-03-05 DIAGNOSIS — I252 Old myocardial infarction: Secondary | ICD-10-CM

## 2024-03-05 DIAGNOSIS — K766 Portal hypertension: Secondary | ICD-10-CM | POA: Diagnosis present

## 2024-03-05 DIAGNOSIS — I10 Essential (primary) hypertension: Secondary | ICD-10-CM | POA: Diagnosis present

## 2024-03-05 DIAGNOSIS — K8020 Calculus of gallbladder without cholecystitis without obstruction: Secondary | ICD-10-CM | POA: Diagnosis present

## 2024-03-05 DIAGNOSIS — E785 Hyperlipidemia, unspecified: Secondary | ICD-10-CM | POA: Diagnosis present

## 2024-03-05 DIAGNOSIS — D6959 Other secondary thrombocytopenia: Secondary | ICD-10-CM | POA: Diagnosis present

## 2024-03-05 DIAGNOSIS — K7581 Nonalcoholic steatohepatitis (NASH): Secondary | ICD-10-CM | POA: Diagnosis present

## 2024-03-05 DIAGNOSIS — Z825 Family history of asthma and other chronic lower respiratory diseases: Secondary | ICD-10-CM | POA: Diagnosis not present

## 2024-03-05 DIAGNOSIS — Z87891 Personal history of nicotine dependence: Secondary | ICD-10-CM | POA: Diagnosis not present

## 2024-03-05 DIAGNOSIS — Z6834 Body mass index (BMI) 34.0-34.9, adult: Secondary | ICD-10-CM | POA: Diagnosis not present

## 2024-03-05 DIAGNOSIS — J42 Unspecified chronic bronchitis: Secondary | ICD-10-CM | POA: Diagnosis not present

## 2024-03-05 DIAGNOSIS — N1832 Chronic kidney disease, stage 3b: Secondary | ICD-10-CM | POA: Diagnosis present

## 2024-03-05 DIAGNOSIS — Z79899 Other long term (current) drug therapy: Secondary | ICD-10-CM

## 2024-03-05 LAB — COMPREHENSIVE METABOLIC PANEL WITH GFR
ALT: 14 U/L (ref 0–44)
AST: 32 U/L (ref 15–41)
Albumin: 2.8 g/dL — ABNORMAL LOW (ref 3.5–5.0)
Alkaline Phosphatase: 58 U/L (ref 38–126)
Anion gap: 9 (ref 5–15)
BUN: 20 mg/dL (ref 8–23)
CO2: 25 mmol/L (ref 22–32)
Calcium: 8.8 mg/dL — ABNORMAL LOW (ref 8.9–10.3)
Chloride: 105 mmol/L (ref 98–111)
Creatinine, Ser: 1.25 mg/dL — ABNORMAL HIGH (ref 0.44–1.00)
GFR, Estimated: 47 mL/min — ABNORMAL LOW (ref >60)
Glucose, Bld: 111 mg/dL — ABNORMAL HIGH (ref 70–99)
Potassium: 4.2 mmol/L (ref 3.5–5.1)
Sodium: 139 mmol/L (ref 135–145)
Total Bilirubin: 1.1 mg/dL (ref 0.0–1.2)
Total Protein: 6 g/dL — ABNORMAL LOW (ref 6.5–8.1)

## 2024-03-05 LAB — I-STAT CHEM 8, ED
BUN: 24 mg/dL — ABNORMAL HIGH (ref 8–23)
Calcium, Ion: 1.18 mmol/L (ref 1.15–1.40)
Chloride: 106 mmol/L (ref 98–111)
Creatinine, Ser: 1.3 mg/dL — ABNORMAL HIGH (ref 0.44–1.00)
Glucose, Bld: 102 mg/dL — ABNORMAL HIGH (ref 70–99)
HCT: 32 % — ABNORMAL LOW (ref 36.0–46.0)
Hemoglobin: 10.9 g/dL — ABNORMAL LOW (ref 12.0–15.0)
Potassium: 4.2 mmol/L (ref 3.5–5.1)
Sodium: 141 mmol/L (ref 135–145)
TCO2: 26 mmol/L (ref 22–32)

## 2024-03-05 LAB — CBC WITH DIFFERENTIAL/PLATELET
Abs Immature Granulocytes: 0.02 K/uL (ref 0.00–0.07)
Basophils Absolute: 0.1 K/uL (ref 0.0–0.1)
Basophils Relative: 1 %
Eosinophils Absolute: 0.1 K/uL (ref 0.0–0.5)
Eosinophils Relative: 1 %
HCT: 33.3 % — ABNORMAL LOW (ref 36.0–46.0)
Hemoglobin: 10.5 g/dL — ABNORMAL LOW (ref 12.0–15.0)
Immature Granulocytes: 0 %
Lymphocytes Relative: 27 %
Lymphs Abs: 1.7 K/uL (ref 0.7–4.0)
MCH: 28.6 pg (ref 26.0–34.0)
MCHC: 31.5 g/dL (ref 30.0–36.0)
MCV: 90.7 fL (ref 80.0–100.0)
Monocytes Absolute: 0.4 K/uL (ref 0.1–1.0)
Monocytes Relative: 6 %
Neutro Abs: 3.9 K/uL (ref 1.7–7.7)
Neutrophils Relative %: 65 %
Platelets: 137 K/uL — ABNORMAL LOW (ref 150–400)
RBC: 3.67 MIL/uL — ABNORMAL LOW (ref 3.87–5.11)
RDW: 16 % — ABNORMAL HIGH (ref 11.5–15.5)
WBC: 6.2 K/uL (ref 4.0–10.5)
nRBC: 0 % (ref 0.0–0.2)

## 2024-03-05 LAB — LIPID PANEL
Cholesterol: 133 mg/dL (ref 0–200)
HDL: 50 mg/dL (ref >40)
LDL Cholesterol: 77 mg/dL (ref 0–99)
Total CHOL/HDL Ratio: 2.7 ratio
Triglycerides: 30 mg/dL (ref <150)
VLDL: 6 mg/dL (ref 0–40)

## 2024-03-05 LAB — TROPONIN I (HIGH SENSITIVITY)
Troponin I (High Sensitivity): 5 ng/L (ref <18)
Troponin I (High Sensitivity): 6 ng/L (ref <18)

## 2024-03-05 LAB — C-REACTIVE PROTEIN: CRP: <0.5 mg/dL (ref <1.0)

## 2024-03-05 LAB — GAMMA GT: GGT: 20 U/L (ref 7–50)

## 2024-03-05 LAB — LIPASE, BLOOD: Lipase: >2800 U/L — ABNORMAL HIGH (ref 11–51)

## 2024-03-05 MED ORDER — PANTOPRAZOLE SODIUM 40 MG IV SOLR
40.0000 mg | Freq: Two times a day (BID) | INTRAVENOUS | Status: DC
  Administered 2024-03-05 – 2024-03-06 (×3): 40 mg via INTRAVENOUS
  Filled 2024-03-05 (×3): qty 10

## 2024-03-05 MED ORDER — ONDANSETRON HCL 4 MG/2ML IJ SOLN
4.0000 mg | Freq: Once | INTRAMUSCULAR | Status: AC
  Administered 2024-03-05: 4 mg via INTRAVENOUS
  Filled 2024-03-05: qty 2

## 2024-03-05 MED ORDER — MORPHINE SULFATE (PF) 2 MG/ML IV SOLN
2.0000 mg | INTRAVENOUS | Status: DC | PRN
  Administered 2024-03-05 (×2): 2 mg via INTRAVENOUS
  Filled 2024-03-05 (×2): qty 1

## 2024-03-05 MED ORDER — IOHEXOL 350 MG/ML SOLN
100.0000 mL | Freq: Once | INTRAVENOUS | Status: AC | PRN
  Administered 2024-03-05: 100 mL via INTRAVENOUS

## 2024-03-05 MED ORDER — ACETAMINOPHEN 325 MG PO TABS
650.0000 mg | ORAL_TABLET | Freq: Four times a day (QID) | ORAL | Status: DC | PRN
  Administered 2024-03-06 (×2): 650 mg via ORAL
  Filled 2024-03-05 (×2): qty 2

## 2024-03-05 MED ORDER — ONDANSETRON HCL 4 MG PO TABS: 4.0000 mg | ORAL_TABLET | Freq: Four times a day (QID) | ORAL | Status: DC | PRN

## 2024-03-05 MED ORDER — FENTANYL CITRATE (PF) 50 MCG/ML IJ SOSY
50.0000 ug | PREFILLED_SYRINGE | Freq: Once | INTRAMUSCULAR | Status: AC
  Administered 2024-03-05: 50 ug via INTRAVENOUS
  Filled 2024-03-05: qty 1

## 2024-03-05 MED ORDER — ACETAMINOPHEN 650 MG RE SUPP
650.0000 mg | Freq: Four times a day (QID) | RECTAL | Status: DC | PRN
Start: 2024-03-05 — End: 2024-03-08

## 2024-03-05 MED ORDER — SODIUM CHLORIDE 0.9 % IV BOLUS
500.0000 mL | Freq: Once | INTRAVENOUS | Status: AC
  Administered 2024-03-05: 500 mL via INTRAVENOUS

## 2024-03-05 MED ORDER — BUDESON-GLYCOPYRROL-FORMOTEROL 160-9-4.8 MCG/ACT IN AERO: 2.0000 | INHALATION_SPRAY | Freq: Every day | RESPIRATORY_TRACT | Status: DC

## 2024-03-05 MED ORDER — FLUTICASONE FUROATE-VILANTEROL 200-25 MCG/ACT IN AEPB
1.0000 | INHALATION_SPRAY | Freq: Every day | RESPIRATORY_TRACT | Status: DC
  Administered 2024-03-06 – 2024-03-08 (×3): 1 via RESPIRATORY_TRACT
  Filled 2024-03-05: qty 28

## 2024-03-05 MED ORDER — LACTATED RINGERS IV SOLN: Freq: Once | INTRAVENOUS | Status: AC

## 2024-03-05 MED ORDER — ONDANSETRON HCL 4 MG/2ML IJ SOLN
4.0000 mg | Freq: Four times a day (QID) | INTRAMUSCULAR | Status: DC | PRN
  Administered 2024-03-05: 4 mg via INTRAVENOUS
  Filled 2024-03-05: qty 2

## 2024-03-05 MED ORDER — PROMETHAZINE HCL 25 MG/ML IJ SOLN
12.5000 mg | Freq: Once | INTRAMUSCULAR | Status: AC
  Administered 2024-03-05: 12.5 mg via INTRAVENOUS
  Filled 2024-03-05: qty 12.5

## 2024-03-05 MED ORDER — ATORVASTATIN CALCIUM 80 MG PO TABS
80.0000 mg | ORAL_TABLET | Freq: Every day | ORAL | Status: DC
  Administered 2024-03-05 – 2024-03-08 (×4): 80 mg via ORAL
  Filled 2024-03-05 (×2): qty 1
  Filled 2024-03-05: qty 2
  Filled 2024-03-05: qty 1

## 2024-03-05 MED ORDER — ALBUTEROL SULFATE (2.5 MG/3ML) 0.083% IN NEBU
2.5000 mg | INHALATION_SOLUTION | RESPIRATORY_TRACT | Status: DC | PRN
Start: 2024-03-05 — End: 2024-03-08

## 2024-03-05 NOTE — ED Notes (Signed)
 Called and placed PT on monitor with CCMD

## 2024-03-05 NOTE — ED Notes (Signed)
 Called main lab about add on to previous collection for Lipid panel and c reactive protein. Additional gold and light green tube sent down

## 2024-03-05 NOTE — ED Provider Notes (Signed)
 [place] EMERGENCY DEPARTMENT AT [facility] Provider Note   CSN: [id number] Arrival date & time: 03/05/24  9176     Patient presents with: Chest Pain   [name] is a 68 y.o. female.   HPI 69 year old female presents with chest pain.  She has a history of prior Takotsubo but she states the chest pain today feels different.  She is having severe pain in her chest and in between her shoulder blades in her back.  Does not seem like it is radiating from front to back but just hurting all over this area.  No radiation of the pain such as to her neck or arms.  Pain started a couple hours ago, a little after 6 AM.  She is having shortness of breath and was having some dry heaving.  No diaphoresis.  No leg swelling or pleuritic or exertional symptoms.  Pain was unchanged with nitroglycerin  being given by EMS.  She was also given aspirin  by EMS.  Prior to Admission medications   Medication Sig Start Date End Date Taking? Authorizing Provider  albuterol  (VENTOLIN  HFA) 108 (90 Base) MCG/ACT inhaler Inhale 2 puffs into the lungs every 4 (four) hours as needed for wheezing or shortness of breath. 12/15/23  Yes [provider]  budesonide -formoterol  (BREYNA ) 160-4.5 MCG/ACT inhaler Inhale 2 puffs into the lungs 2 (two) times daily.   Yes [provider]  carvedilol  (COREG ) 3.125 MG tablet Take 1 tablet (3.125 mg total) by mouth 2 (two) times daily. 10/25/23 03/05/24 Yes Weaver, Krishiv Sandler T, PA-C  furosemide  (LASIX ) 40 MG tablet Take 40 mg (1 tablet) twice daily for five days, then once daily in the morning. Patient taking differently: Take 40 mg by mouth daily. 10/17/23  Yes Duke, Jon Garre, PA  ipratropium-albuterol  (DUONEB) 0.5-2.5 (3) MG/3ML SOLN Take 3 mLs by nebulization 4 (four) times daily for 7 days, THEN 3 mLs every 6 (six) hours as needed for up to 28 days (SOB, wheezing). Patient taking differently:  3 mLs every 6 (six) hours as needed for wheezing/sob  (SOB,  wheezing). 10/10/23 03/05/24 Yes Laurence Locus, DO  lisinopril  (ZESTRIL ) 5 MG tablet Take 1 tablet (5 mg total) by mouth daily. <PLEASE MAKE APPOINTMENT FOR REFILLS> Patient taking differently: Take 5 mg by mouth daily. 05/11/23  Yes Hilty, Vinie BROCKS, MD  budesonide -glycopyrrolate -formoterol  (BREZTRI  AEROSPHERE) 160-9-4.8 MCG/ACT AERO inhaler Inhale 2 puffs into the lungs in the morning and at bedtime. Patient not taking: Reported on 03/05/2024 01/11/24   Desai, Nikita S, MD  dapagliflozin  propanediol (FARXIGA ) 10 MG TABS tablet Take 10 mg by mouth daily. Patient not taking: Reported on 03/05/2024    [provider]  metoprolol  succinate (TOPROL -XL) 25 MG 24 hr tablet Take 25 mg by mouth daily. Patient not taking: Reported on 03/05/2024    [provider]    Allergies: Patient has no known allergies.    Review of Systems  Respiratory:  Positive for shortness of breath.   Cardiovascular:  Positive for chest pain. Negative for leg swelling.  Gastrointestinal:  Positive for nausea. Negative for abdominal pain.  Musculoskeletal:  Positive for back pain.    Updated Vital Signs BP 117/61 (BP Location: Right Arm)   Pulse 67   Temp (!) 97.5 F (36.4 C) (Oral)   Resp 18   Ht 5' 6 (1.676 m)   Wt 97.1 kg   SpO2 100%   BMI 34.54 kg/m   Physical Exam Vitals and nursing note reviewed.  Constitutional:  Appearance: She is well-developed.  HENT:     Head: Normocephalic and atraumatic.  Cardiovascular:     Rate and Rhythm: Normal rate and regular rhythm.     Pulses:          Radial pulses are 2+ on the right side and 2+ on the left side.     Heart sounds: Normal heart sounds.  Pulmonary:     Effort: Pulmonary effort is normal.     Breath sounds: Normal breath sounds.  Abdominal:     Palpations: Abdomen is soft.     Tenderness: There is no abdominal tenderness.  Musculoskeletal:     Thoracic back: No tenderness.     Right lower leg: No edema.     Left lower leg: No edema.   Skin:    General: Skin is warm and dry.  Neurological:     Mental Status: She is alert.     (all labs ordered are listed, but only abnormal results are displayed) Labs Reviewed  COMPREHENSIVE METABOLIC PANEL WITH GFR - Abnormal; Notable for the following components:      Result Value   Glucose, Bld 111 (*)    Creatinine, Ser 1.25 (*)    Calcium  8.8 (*)    Total Protein 6.0 (*)    Albumin 2.8 (*)    GFR, Estimated 47 (*)    All other components within normal limits  CBC WITH DIFFERENTIAL/PLATELET - Abnormal; Notable for the following components:   RBC 3.67 (*)    Hemoglobin 10.5 (*)    HCT 33.3 (*)    RDW 16.0 (*)    Platelets 137 (*)    All other components within normal limits  LIPASE, BLOOD - Abnormal; Notable for the following components:   Lipase >2,800 (*)    All other components within normal limits  I-STAT CHEM 8, ED - Abnormal; Notable for the following components:   BUN 24 (*)    Creatinine, Ser 1.30 (*)    Glucose, Bld 102 (*)    Hemoglobin 10.9 (*)    HCT 32.0 (*)    All other components within normal limits  LIPID PANEL  C-REACTIVE PROTEIN  GAMMA GT  TROPONIN I (HIGH SENSITIVITY)  TROPONIN I (HIGH SENSITIVITY)    EKG: EKG Interpretation Date/Time:  Monday March 05 2024 09:18:19 EST Ventricular Rate:  50 PR Interval:  178 QRS Duration:  97 QT Interval:  474 QTC Calculation: 433 R Axis:   79  Text Interpretation: Sinus bradycardia no acute ST/T changes no significant change since earlier in the day Confirmed by Freddi Hamilton [phone]) on 03/05/2024 10:28:23 AM  Radiology: CT Angio Chest/Abd/Pel for Dissection W and/or Wo Contrast Result Date: 03/05/2024 CLINICAL DATA:  Acute aortic syndrome (AAS) suspected EXAM: CT ANGIOGRAPHY CHEST, ABDOMEN AND PELVIS TECHNIQUE: Non-contrast CT of the chest was initially obtained. Multidetector CT imaging through the chest, abdomen and pelvis was performed using the standard protocol during bolus administration  of intravenous contrast. Multiplanar reconstructed images and MIPs were obtained and reviewed to evaluate the vascular anatomy. RADIATION DOSE REDUCTION: This exam was performed according to the departmental dose-optimization program which includes automated exposure control, adjustment of the mA and/or kV according to patient size and/or use of iterative reconstruction technique. CONTRAST:  OMNIPAQUE  IOHEXOL  350 MG/ML SOLN COMPARISON:  03/05/2024, 02/16/2022 FINDINGS: CTA CHEST FINDINGS Pulmonary Embolism: No pulmonary embolism. Cardiovascular: No cardiomegaly or pericardial effusion. No aortic aneurysm, intramural hematoma, or aortic dissection. Mediastinum/Nodes: No mediastinal mass. Unchanged, calcified lymph nodes  throughout the mediastinum and bilateral hilar regions. No mediastinal, hilar, or axillary lymphadenopathy. Lungs/Pleura: The midline trachea and bronchi are patent. Similar fibrolinear scarring within both lung apices and in the left upper lobe. Similar mosaic attenuation again appreciated in both lower lobes, likely due to chronic small airways disease. No focal airspace consolidation, pleural effusion, or pneumothorax. Subsegmental atelectasis in the right lower lobe. Review of the MIP images confirms the above findings. CTA ABDOMEN AND PELVIS FINDINGS VASCULAR Aorta: No aortic aneurysm or aortic dissection. Scattered calcified atherosclerosis. no hemodynamically significant stenosis. Celiac: Patent without acute thrombus, aneurysm, or dissection. No hemodynamically significant stenosis. SMA: Patent without acute thrombus, aneurysm, or dissection. No hemodynamically significant stenosis. Renals: On the right, there are 2 small renal arteries with a single left renal artery. Patent without acute thrombus, aneurysm, or dissection. No hemodynamically significant stenosis. IMA: Patent without acute thrombus, aneurysm, or dissection. Mild stenosis at the ostium from noncalcified plaque. Inflow:  Patent without acute thrombus, aneurysm, or dissection. Scattered calcified atherosclerosis throughout without hemodynamically significant stenosis. Proximal Outflow: The bilateral common femoral and visualized portions of the superficial and profunda femoral arteries are patent without acute thrombus, aneurysm, or dissection. No hemodynamically significant stenosis. Veins: Nondiagnostic evaluation of the veins due to arterial timing of the contrast bolus. Review of the MIP images confirms the above findings. NON-VASCULAR Hepatobiliary: Cirrhotic liver with recanalization of the umbilical vein. No intrahepatic mass visualized.Punctate radiopaque gallstones. No gallbladder wall thickening. No intrahepatic or extrahepatic biliary ductal dilation. Pancreas: No mass or main ductal dilation.Acute peripancreatic fluid and edema interdigitating within the pancreatic parenchyma and surrounding the pancreatic head and downstream body region. No well-formed or drainable fluid collection. Spleen: Mild splenomegaly.  No mass. Adrenals/Urinary Tract: No adrenal masses. Mild renal cortical atrophy bilaterally. No hydronephrosis or nephrolithiasis. The urinary bladder is completely decompressed. Stomach/Bowel: Small hiatal hernia. The stomach is decompressed without focal abnormality. No small bowel wall thickening or inflammation. No small bowel obstruction.Normal appendix. Mild wall thickening of the ascending and proximal transverse colon. Sigmoid colonic diverticulosis. No changes of acute diverticulitis. Lymphatic: No intraabdominal or pelvic lymphadenopathy. Reproductive: Age-related atrophy of the uterus and ovaries. No concerning adnexal mass. No free pelvic fluid. Other: No pneumoperitoneum, ascites, or mesenteric inflammation. Musculoskeletal: No acute fracture or destructive lesion.Developing caput medusa. Osteopenia. 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 Review of the MIP images confirms the  above findings. IMPRESSION: 1. No aortic aneurysm, intramural hematoma, or aortic dissection. 2. Acute interstitial edematous pancreatitis. No well-formed or drainable fluid collection. Correlation with serum lipase recommended. 3. Cirrhotic liver with sequelae of portal hypertension, including recanalization of the umbilical vein and splenomegaly. Continued biannual HCC surveillance recommended. 4. Mild wall thickening of the right colon, likely reflecting sequelae of portal colopathy. 5. Sigmoid diverticulosis.  No changes of acute diverticulitis. 6. No acute intrathoracic abnormality. No pneumonia, pulmonary edema, or pleural effusion. Aortic Atherosclerosis (ICD10-I70.0). Electronically Signed   By: Rogelia [place] M.D.   On: 03/05/2024 11:01   DG Chest Portable 1 View Result Date: 03/05/2024 EXAM: 1 VIEW(S) XRAY OF THE CHEST 03/05/2024 08:48:00 AM COMPARISON: 10/07/2023 CLINICAL HISTORY: chest/back pain FINDINGS: LUNGS AND PLEURA: Chronic linear atelectasis/scarring of left upper lobe. Possible retrocardiac airspace opacity. No pulmonary edema. No pleural effusion. No pneumothorax. HEART AND MEDIASTINUM: Calcified mediastinal lymph nodes. Aortic calcification. BONES AND SOFT TISSUES: No acute osseous abnormality. IMPRESSION: 1. Possible retrocardiac airspace opacity, suspicious for pneumonia or atelectasis. Electronically signed by: Waddell Calk MD 03/05/2024 09:04 AM EST RP Workstation:  [id number]     Procedures   Medications Ordered in the ED  acetaminophen  (TYLENOL ) tablet 650 mg (has no administration in time range)    Or  acetaminophen  (TYLENOL ) suppository 650 mg (has no administration in time range)  morphine  (PF) 2 MG/ML injection 2 mg (2 mg Intravenous Given 03/05/24 1330)  ondansetron  (ZOFRAN ) tablet 4 mg ( Oral See Alternative 03/05/24 1330)    Or  ondansetron  (ZOFRAN ) injection 4 mg (4 mg Intravenous Given 03/05/24 1330)  albuterol  (PROVENTIL ) (2.5 MG/3ML) 0.083% nebulizer solution  2.5 mg (has no administration in time range)  atorvastatin  (LIPITOR ) tablet 80 mg (80 mg Oral Given 03/05/24 1330)  budesonide -glycopyrrolate -formoterol  (BREZTRI ) 160-9-4.8 MCG/ACT inhaler 2 puff (has no administration in time range)  pantoprazole  (PROTONIX ) injection 40 mg (40 mg Intravenous Given 03/05/24 1330)  fentaNYL  (SUBLIMAZE ) injection 50 mcg (50 mcg Intravenous Given 03/05/24 0844)  ondansetron  (ZOFRAN ) injection 4 mg (4 mg Intravenous Given 03/05/24 0844)  sodium chloride  0.9 % bolus 500 mL (0 mLs Intravenous Stopped 03/05/24 1042)  ondansetron  (ZOFRAN ) injection 4 mg (4 mg Intravenous Given 03/05/24 1004)  sodium chloride  0.9 % bolus 500 mL (0 mLs Intravenous Stopped 03/05/24 1137)  iohexol  (OMNIPAQUE ) 350 MG/ML injection 100 mL (100 mLs Intravenous Contrast Given 03/05/24 0953)  promethazine (PHENERGAN) 12.5 mg in sodium chloride  0.9 % 50 mL IVPB (0 mg Intravenous Stopped 03/05/24 1112)  lactated ringers  infusion ( Intravenous New Bag/Given 03/05/24 1350)                                    Medical Decision Making Amount and/or Complexity of Data Reviewed External Data Reviewed: notes. Labs: ordered.    Details: Normal troponins, elevated lipase Radiology: ordered and independent interpretation performed.    Details: Pancreatitis ECG/medicine tests: ordered and independent interpretation performed.    Details: No ischemia  Risk Prescription drug management. Decision regarding hospitalization.   Patient was worked up with a dissection CT given pain in her chest and back.  She did transiently require a little bit of fluids due to some hypotension after some pain medicine.  Was also later given some Phenergan due to recurrent nausea.  Ultimately found to have pancreatitis which would explain all of her symptoms.  Blood pressure has improved.  Pain is a lot better controlled and no further vomiting.  I think she will need admission.  Discussed with Dr. Tobie.  No MI noted.     Final  diagnoses:  Acute pancreatitis without infection or necrosis, unspecified pancreatitis type    ED Discharge Orders     None          Freddi Hamilton, MD 03/05/24 [phone]

## 2024-03-05 NOTE — ED Notes (Signed)
 This RN assumes care of this PT at this time

## 2024-03-05 NOTE — H&P (Signed)
 History and Physical    Patient: [name] FMW:[id number] DOB: 10-30-1955 DOA: 03/05/2024 DOS: the patient was seen and examined on 03/05/2024 . PCP: Patient, No Pcp Per  Patient coming from: Home Chief complaint: Chief Complaint  Patient presents with   Chest Pain   HPI:  [name] is a 68 y.o. female with past medical history  of  essential hypertension, hyperlipidemia, COPD, history of stress-induced cardiomyopathy, history of NSTEMI, history of bright red blood per rectum and GI bleed, history of HCAP, history of CKD stage IIIb serum creatinine baseline 1.6-1.7, comes today with shortness of breath that started at 6 AM today acute onset it was actually followed by pain in between her shoulder blades that radiated anteriorly to her chest currently patient has no chest pain, she did have nausea and no vomiting or abdominal pain.  ED Course:  Vital signs in the ED were notable for the following:  Vitals:   03/05/24 1330 03/05/24 1345 03/05/24 1400 03/05/24 1415  BP: 121/67 (!) 108/35 (!) 100/39 (!) 96/36  Pulse: 63 66 66 66  Temp:      Resp: 14 11 20 13   Height:      Weight:      SpO2: 100% 100% 97% 98%  TempSrc:      BMI (Calculated):       >>ED evaluation thus far shows: CMP shows glucose 111 CKD stage IIIb with a creatinine currently improved at 1.25 eGFR 47. Normal LFTs.  Lipase 2800.   CBC shows anemia with a hemoglobin of 10.5 platelets of 137 MCV of 90.7 normal white count. Normal differential.  CT shows AIP.  >>While in the ED patient received the following: Medications  fentaNYL  (SUBLIMAZE ) injection 50 mcg (50 mcg Intravenous Given 03/05/24 0844)  ondansetron  (ZOFRAN ) injection 4 mg (4 mg Intravenous Given 03/05/24 0844)  sodium chloride  0.9 % bolus 500 mL (0 mLs Intravenous Stopped 03/05/24 1042)  ondansetron  (ZOFRAN ) injection 4 mg (4 mg Intravenous Given 03/05/24 1004)  sodium chloride  0.9 % bolus 500 mL (0 mLs Intravenous Stopped 03/05/24 1137)   iohexol  (OMNIPAQUE ) 350 MG/ML injection 100 mL (100 mLs Intravenous Contrast Given 03/05/24 0953)  promethazine (PHENERGAN) 12.5 mg in sodium chloride  0.9 % 50 mL IVPB (0 mg Intravenous Stopped 03/05/24 1112)   Review of Systems  Cardiovascular:  Positive for chest pain.  Gastrointestinal:  Positive for nausea. Negative for blood in stool and melena.  Musculoskeletal:  Positive for back pain.  All other systems reviewed and are negative.  Past Medical History:  Diagnosis Date   Anemia    Anxiety    CHF (congestive heart failure) (HCC)    COPD (chronic obstructive pulmonary disease) (HCC)    Dyspnea    Dysrhythmia    GERD (gastroesophageal reflux disease)    GI bleed 05/2016   Hyperlipidemia    Hypertension    Myocardial infarction (HCC)    Nonischemic cardiomyopathy (HCC)    a. stress-induced cardiomyopathy by cath 2010 (EF % at that time) - normal cors 2010 & 2012. b. 2013: echo showing EF of 50-55%. c. 05/2016: NSTEMI with cath showing normal cors and periapical akniesis --> consistent with Takotsubo Cardiomyopathy. EF down to 25-30%.   Obesity    PONV (postoperative nausea and vomiting)    Takotsubo cardiomyopathy    Thyroid disease    Tobacco abuse    Past Surgical History:  Procedure Laterality Date   BREAST LUMPECTOMY WITH RADIOACTIVE SEED LOCALIZATION Left 04/08/2017   Procedure: LEFT  BREAST LUMPECTOMY WITH RADIOACTIVE SEED LOCALIZATION;  Surgeon: Ethyl Lenis, MD;  Location[facility] OR;  Service: General;  Laterality: Left;   CARDIAC CATHETERIZATION  2010   no signficant CAD. EF 30%, apical ballooning, takotsubo cardiomyopathy   CARDIAC CATHETERIZATION N/A 05/18/2016   Procedure: Left Heart Cath and Coronary Angiography;  Surgeon: Ozell Fell, MD;  Location: [facility] INVASIVE CV LAB;  Service: Cardiovascular;  Laterality: N/A;   RIGHT/LEFT HEART CATH AND CORONARY ANGIOGRAPHY N/A 10/07/2023   Procedure: RIGHT/LEFT HEART CATH AND CORONARY ANGIOGRAPHY;  Surgeon: Elmira Newman PARAS,  MD;  Location: [facility] INVASIVE CV LAB;  Service: Cardiovascular;  Laterality: N/A;   TRANSTHORACIC ECHOCARDIOGRAM  05/2011   EF 50-55%    reports that she quit smoking about 20 years ago. Her smoking use included cigarettes. She started smoking about 48 years ago. She has a 27.9 pack-year smoking history. She has never used smokeless tobacco. She reports that she does not drink alcohol and does not use drugs. No Known Allergies Family History  Problem Relation Age of Onset   Lung cancer Mother        died in 2004/03/18   Cancer - Lung Mother    COPD Father    Heart disease Brother        CABG at age 82   Breast cancer Maternal Grandmother    Prior to Admission medications   Medication Sig Start Date End Date Taking? Authorizing Provider  albuterol  (VENTOLIN  HFA) 108 (90 Base) MCG/ACT inhaler Inhale 2 puffs into the lungs every 4 (four) hours as needed for wheezing or shortness of breath. 12/15/23   [provider]  atorvastatin  (LIPITOR ) 80 MG tablet Take 1 tablet (80 mg total) by mouth daily. 10/10/23 01/11/24  Laurence Locus, DO  budesonide -glycopyrrolate -formoterol  (BREZTRI  AEROSPHERE) 160-9-4.8 MCG/ACT AERO inhaler Inhale 2 puffs into the lungs in the morning and at bedtime. 01/11/24   Meade Verdon RAMAN, MD  budesonide -glycopyrrolate -formoterol  (BREZTRI  AEROSPHERE) 160-9-4.8 MCG/ACT AERO inhaler Inhale 2 puffs into the lungs in the morning and at bedtime. 01/20/24   Meade Verdon RAMAN, MD  budesonide -glycopyrrolate -formoterol  (BREZTRI  AEROSPHERE) 160-9-4.8 MCG/ACT AERO inhaler Inhale 2 puffs into the lungs in the morning and at bedtime. 01/20/24   Meade Verdon RAMAN, MD  carvedilol  (COREG ) 3.125 MG tablet Take 1 tablet (3.125 mg total) by mouth 2 (two) times daily. 10/25/23 01/23/24  Lelon Hamilton T, PA-C  furosemide  (LASIX ) 40 MG tablet Take 40 mg (1 tablet) twice daily for five days, then once daily in the morning. 10/17/23   [facility], Jon Garre, PA  ipratropium-albuterol  (DUONEB) 0.5-2.5 (3) MG/3ML SOLN Take  3 mLs by nebulization 4 (four) times daily for 7 days, THEN 3 mLs every 6 (six) hours as needed for up to 28 days (SOB, wheezing). 10/10/23 01/11/24  Laurence Locus, DO  lisinopril  (ZESTRIL ) 5 MG tablet Take 1 tablet (5 mg total) by mouth daily. <PLEASE MAKE APPOINTMENT FOR REFILLS> 05/11/23   Mona Vinie BROCKS, MD                                                                                 Vitals:   03/05/24 1330 03/05/24 1345 03/05/24 1400 03/05/24 1415  BP: 121/67 (!) 108/35 ROLLEN)  100/39 (!) 96/36  Pulse: 63 66 66 66  Resp: 14 11 20 13   Temp:      TempSrc:      SpO2: 100% 100% 97% 98%  Weight:      Height:       Physical Exam Vitals reviewed.  Constitutional:      General: She is not in acute distress.    Appearance: She is not ill-appearing.  HENT:     Head: Normocephalic.  Eyes:     Extraocular Movements: Extraocular movements intact.  Cardiovascular:     Rate and Rhythm: Normal rate and regular rhythm.     Heart sounds: Normal heart sounds.  Pulmonary:     Effort: Pulmonary effort is normal.     Breath sounds: Normal breath sounds.  Abdominal:     General: There is no distension.     Palpations: Abdomen is soft.     Tenderness: There is no abdominal tenderness. There is no guarding.  Musculoskeletal:     Right lower leg: No edema.     Left lower leg: No edema.  Neurological:     General: No focal deficit present.     Mental Status: She is alert and oriented to person, place, and time.     Labs on Admission: I have personally reviewed following labs and imaging studies CBC: Recent Labs  Lab 03/05/24 0844 03/05/24 0859  WBC 6.2  --   NEUTROABS 3.9  --   HGB 10.5* 10.9*  HCT 33.3* 32.0*  MCV 90.7  --   PLT 137*  --    Basic Metabolic Panel: Recent Labs  Lab 03/05/24 0844 03/05/24 0859  NA 139 141  K 4.2 4.2  CL 105 106  CO2 25  --   GLUCOSE 111* 102*  BUN 20 24*  CREATININE 1.25* 1.30*  CALCIUM  8.8*  --    GFR: Estimated Creatinine Clearance: 49.3  mL/min (A) (by C-G formula based on SCr of 1.3 mg/dL (H)). Liver Function Tests: Recent Labs  Lab 03/05/24 0844  AST 32  ALT 14  ALKPHOS 58  BILITOT 1.1  PROT 6.0*  ALBUMIN 2.8*   Recent Labs  Lab 03/05/24 1037  LIPASE >2,800*   No results for input(s): AMMONIA in the last 168 hours. Recent Labs    10/07/23 0238 10/08/23 0841 10/09/23 0358 10/10/23 0501 10/17/23 1639 10/25/23 1557 03/05/24 0844 03/05/24 0859  BUN 21 24* 26* 31* 26 16 20  24*  CREATININE 1.70* 1.84* 1.92* 1.93* 1.59* 1.42* 1.25* 1.30*    Cardiac Enzymes: No results for input(s): CKTOTAL, CKMB, CKMBINDEX, TROPONINI in the last 168 hours. BNP (last 3 results) No results for input(s): PROBNP in the last 8760 hours. HbA1C: No results for input(s): HGBA1C in the last 72 hours. CBG: No results for input(s): GLUCAP in the last 168 hours. Lipid Profile: Recent Labs    03/05/24 1315  CHOL 133  HDL 50  LDLCALC 77  TRIG 30  CHOLHDL 2.7   Thyroid Function Tests: No results for input(s): TSH, T4TOTAL, FREET4, T3FREE, THYROIDAB in the last 72 hours. Anemia Panel: No results for input(s): VITAMINB12, FOLATE, FERRITIN, TIBC, IRON, RETICCTPCT in the last 72 hours. Urine analysis: No results found for: COLORURINE, APPEARANCEUR, LABSPEC, PHURINE, GLUCOSEU, HGBUR, BILIRUBINUR, KETONESUR, PROTEINUR, UROBILINOGEN, NITRITE, LEUKOCYTESUR Radiological Exams on Admission: US  ABDOMEN LIMITED RUQ (LIVER/GB) Result Date: 03/05/2024 CLINICAL DATA:  Pancreatitis. EXAM: ULTRASOUND ABDOMEN LIMITED RIGHT UPPER QUADRANT COMPARISON:  None Available. FINDINGS: Gallbladder: No gallstones or wall thickening visualized. No  sonographic Beverley sign noted by sonographer. Common bile duct: Diameter: 4 mm, within normal limits. No intrahepatic biliary duct dilatation. Liver: Diffusely increased in echogenicity. No definite focal lesion. Portal vein is patent on color Doppler  imaging with normal direction of blood flow towards the liver. Other: None. IMPRESSION: 1. No acute findings. 2. Hepatic steatosis. Electronically Signed   By: Newell Eke M.D.   On: 03/05/2024 14:33   CT Angio Chest/Abd/Pel for Dissection W and/or Wo Contrast Result Date: 03/05/2024 CLINICAL DATA:  Acute aortic syndrome (AAS) suspected EXAM: CT ANGIOGRAPHY CHEST, ABDOMEN AND PELVIS TECHNIQUE: Non-contrast CT of the chest was initially obtained. Multidetector CT imaging through the chest, abdomen and pelvis was performed using the standard protocol during bolus administration of intravenous contrast. Multiplanar reconstructed images and MIPs were obtained and reviewed to evaluate the vascular anatomy. RADIATION DOSE REDUCTION: This exam was performed according to the departmental dose-optimization program which includes automated exposure control, adjustment of the mA and/or kV according to patient size and/or use of iterative reconstruction technique. CONTRAST:  OMNIPAQUE  IOHEXOL  350 MG/ML SOLN COMPARISON:  03/05/2024, 02/16/2022 FINDINGS: CTA CHEST FINDINGS Pulmonary Embolism: No pulmonary embolism. Cardiovascular: No cardiomegaly or pericardial effusion. No aortic aneurysm, intramural hematoma, or aortic dissection. Mediastinum/Nodes: No mediastinal mass. Unchanged, calcified lymph nodes throughout the mediastinum and bilateral hilar regions. No mediastinal, hilar, or axillary lymphadenopathy. Lungs/Pleura: The midline trachea and bronchi are patent. Similar fibrolinear scarring within both lung apices and in the left upper lobe. Similar mosaic attenuation again appreciated in both lower lobes, likely due to chronic small airways disease. No focal airspace consolidation, pleural effusion, or pneumothorax. Subsegmental atelectasis in the right lower lobe. Review of the MIP images confirms the above findings. CTA ABDOMEN AND PELVIS FINDINGS VASCULAR Aorta: No aortic aneurysm or aortic dissection.  Scattered calcified atherosclerosis. no hemodynamically significant stenosis. Celiac: Patent without acute thrombus, aneurysm, or dissection. No hemodynamically significant stenosis. SMA: Patent without acute thrombus, aneurysm, or dissection. No hemodynamically significant stenosis. Renals: On the right, there are 2 small renal arteries with a single left renal artery. Patent without acute thrombus, aneurysm, or dissection. No hemodynamically significant stenosis. IMA: Patent without acute thrombus, aneurysm, or dissection. Mild stenosis at the ostium from noncalcified plaque. Inflow: Patent without acute thrombus, aneurysm, or dissection. Scattered calcified atherosclerosis throughout without hemodynamically significant stenosis. Proximal Outflow: The bilateral common femoral and visualized portions of the superficial and profunda femoral arteries are patent without acute thrombus, aneurysm, or dissection. No hemodynamically significant stenosis. Veins: Nondiagnostic evaluation of the veins due to arterial timing of the contrast bolus. Review of the MIP images confirms the above findings. NON-VASCULAR Hepatobiliary: Cirrhotic liver with recanalization of the umbilical vein. No intrahepatic mass visualized.Punctate radiopaque gallstones. No gallbladder wall thickening. No intrahepatic or extrahepatic biliary ductal dilation. Pancreas: No mass or main ductal dilation.Acute peripancreatic fluid and edema interdigitating within the pancreatic parenchyma and surrounding the pancreatic head and downstream body region. No well-formed or drainable fluid collection. Spleen: Mild splenomegaly.  No mass. Adrenals/Urinary Tract: No adrenal masses. Mild renal cortical atrophy bilaterally. No hydronephrosis or nephrolithiasis. The urinary bladder is completely decompressed. Stomach/Bowel: Small hiatal hernia. The stomach is decompressed without focal abnormality. No small bowel wall thickening or inflammation. No small bowel  obstruction.Normal appendix. Mild wall thickening of the ascending and proximal transverse colon. Sigmoid colonic diverticulosis. No changes of acute diverticulitis. Lymphatic: No intraabdominal or pelvic lymphadenopathy. Reproductive: Age-related atrophy of the uterus and ovaries. No concerning adnexal mass. No free pelvic fluid. Other: No  pneumoperitoneum, ascites, or mesenteric inflammation. Musculoskeletal: No acute fracture or destructive lesion.Developing caput medusa. Osteopenia. 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 Review of the MIP images confirms the above findings. IMPRESSION: 1. No aortic aneurysm, intramural hematoma, or aortic dissection. 2. Acute interstitial edematous pancreatitis. No well-formed or drainable fluid collection. Correlation with serum lipase recommended. 3. Cirrhotic liver with sequelae of portal hypertension, including recanalization of the umbilical vein and splenomegaly. Continued biannual HCC surveillance recommended. 4. Mild wall thickening of the right colon, likely reflecting sequelae of portal colopathy. 5. Sigmoid diverticulosis.  No changes of acute diverticulitis. 6. No acute intrathoracic abnormality. No pneumonia, pulmonary edema, or pleural effusion. Aortic Atherosclerosis (ICD10-I70.0). Electronically Signed   By: Rogelia Myers M.D.   On: 03/05/2024 11:01   DG Chest Portable 1 View Result Date: 03/05/2024 EXAM: 1 VIEW(S) XRAY OF THE CHEST 03/05/2024 08:48:00 AM COMPARISON: 10/07/2023 CLINICAL HISTORY: chest/back pain FINDINGS: LUNGS AND PLEURA: Chronic linear atelectasis/scarring of left upper lobe. Possible retrocardiac airspace opacity. No pulmonary edema. No pleural effusion. No pneumothorax. HEART AND MEDIASTINUM: Calcified mediastinal lymph nodes. Aortic calcification. BONES AND SOFT TISSUES: No acute osseous abnormality. IMPRESSION: 1. Possible retrocardiac airspace opacity, suspicious for pneumonia or atelectasis. Electronically  signed by: Waddell Calk MD 03/05/2024 09:04 AM EST RP Workstation: [id number]   Data Reviewed: Relevant notes from primary care and specialist visits, past discharge summaries as available in EHR, including Care Everywhere . Prior diagnostic testing as pertinent to current admission diagnoses, Updated medications and problem lists for reconciliation .ED course, including vitals, labs, imaging, treatment and response to treatment,Triage notes, nursing and pharmacy notes and ED provider's notes.Notable results as noted in HPI.Discussed case with EDMD/ ED APP/ or Specialty MD on call and as needed.  Assessment & Plan   >>Back pain/ Acute pancreatitis: Atypical presentation with back pain and chest pain and sob.  We empirically cover with IV PPIs, clear diet as tolerated. Right upper quadrant ultrasound. Lipid panel is within normal limits. This could be associated with ACE inhibitor related pancreatitis and we will hold patient's lisinopril  for now.   >> History of Takotsubo cardiomyopathy: Negative cardiac cath 10/08/2023. 2D echo LVEF 35-40%.   >> COPD: As needed albuterol  continue home Breyna .  >> CKD stage IIIb: Baseline creatinine of 1.6-1.7 Lab Results  Component Value Date   CREATININE 1.30 (H) 03/05/2024   CREATININE 1.25 (H) 03/05/2024   CREATININE 1.42 (H) 10/25/2023  Stable avoid contrast unless necessary.    >> Essential hypertension:  Vitals:   03/05/24 0833 03/05/24 0845 03/05/24 0853 03/05/24 0900  BP: (!) 103/52 (!) 88/63 (!) 89/49 (!) 95/46   03/05/24 0915 03/05/24 0939 03/05/24 1045 03/05/24 1259  BP: (!) 88/57 (!) 133/93 (!) 114/44 117/61   03/05/24 1330 03/05/24 1345 03/05/24 1400 03/05/24 1415  BP: 121/67 (!) 108/35 (!) 100/39 (!) 96/36  Hold lisinopril .    >>Anemia/ Thrombocytopenia:    Latest Ref Rng & Units 03/05/2024    8:59 AM 03/05/2024    8:44 AM 10/10/2023    5:01 AM  CBC  WBC 4.0 - 10.5 K/uL  6.2  6.4   Hemoglobin 12.0 - 15.0 g/dL 89.0  89.4   89.8   Hematocrit 36.0 - 46.0 % 32.0  33.3  30.8   Platelets 150 - 400 K/uL  137  100   H/O pos occult in past. We will start IV PPI and type/ screen and occult.  Stable, 2/2 Cirrhotic liver with sequelae of portal hypertension,  and splenomegaly.   >>  Cirrhosis: Cirrhotic liver with recanalization of the umbilical vein. No intrahepatic mass visualized.Punctate radiopaque gallstones. No gallbladder wall thickening. No intrahepatic or extrahepatic biliary ductal dilation. INR added on. GI consult requested.    >> Obesity with a BMI of 34.54:  A1c of 4.6 on June 6.   DVT prophylaxis:  SCD's.  Consults:  GI- sent secure chat - Dr.Nandigam  Advance Care Planning:    Code Status: Full Code   Family Communication:  None  Disposition Plan:  Home.  Severity of Illness: The appropriate patient status for this patient is INPATIENT. Inpatient status is judged to be reasonable and necessary in order to provide the required intensity of service to ensure the patient's safety. The patient's presenting symptoms, physical exam findings, and initial radiographic and laboratory data in the context of their chronic comorbidities is felt to place them at high risk for further clinical deterioration. Furthermore, it is not anticipated that the patient will be medically stable for discharge from the hospital within 2 midnights of admission.   * I certify that at the point of admission it is my clinical judgment that the patient will require inpatient hospital care spanning beyond 2 midnights from the point of admission due to high intensity of service, high risk for further deterioration and high frequency of surveillance required.*  Unresulted Labs (From admission, onward)     Start     Ordered   03/06/24 0500  Comprehensive metabolic panel  Tomorrow morning,   R        03/05/24 1300   03/06/24 0500  CBC  Tomorrow morning,   R        03/05/24 1300   03/06/24 0500  Occult blood card to lab, stool   Daily,   R      03/05/24 1916   03/06/24 0500  Magnesium   Tomorrow morning,   R        03/05/24 1925   03/05/24 1926  Magnesium   Add-on,   AD        03/05/24 1925   03/05/24 1916  Type and screen  Once,   R        03/05/24 1915            Meds ordered this encounter  Medications   fentaNYL  (SUBLIMAZE ) injection 50 mcg   ondansetron  (ZOFRAN ) injection 4 mg   sodium chloride  0.9 % bolus 500 mL   ondansetron  (ZOFRAN ) injection 4 mg   sodium chloride  0.9 % bolus 500 mL   iohexol  (OMNIPAQUE ) 350 MG/ML injection 100 mL   promethazine (PHENERGAN) 12.5 mg in sodium chloride  0.9 % 50 mL IVPB   lactated ringers  infusion   OR Linked Order Group    acetaminophen  (TYLENOL ) tablet 650 mg    acetaminophen  (TYLENOL ) suppository 650 mg   morphine  (PF) 2 MG/ML injection 2 mg   OR Linked Order Group    ondansetron  (ZOFRAN ) tablet 4 mg    ondansetron  (ZOFRAN ) injection 4 mg   albuterol  (PROVENTIL ) (2.5 MG/3ML) 0.083% nebulizer solution 2.5 mg   atorvastatin  (LIPITOR ) tablet 80 mg   budesonide -glycopyrrolate -formoterol  (BREZTRI ) 160-9-4.8 MCG/ACT inhaler 2 puff   pantoprazole  (PROTONIX ) injection 40 mg     Orders Placed This Encounter  Procedures   DG Chest Portable 1 View   CT Angio Chest/Abd/Pel for Dissection W and/or Wo Contrast   US  ABDOMEN LIMITED RUQ (LIVER/GB)   Comprehensive metabolic panel   CBC with Differential   Lipase, blood   Lipid panel   C-reactive  protein   Comprehensive metabolic panel   CBC   Gamma GT   Occult blood card to lab, stool   Magnesium    Magnesium    Diet clear liquid Room service appropriate? Yes; Fluid consistency: Thin   ED Cardiac monitoring   SCDs   Vital signs   Notify physician (specify)   Mobility Protocol: No Restrictions   Refer to Sidebar Report Mobility Protocol for Adult Inpatient   Initiate Adult Central Line Maintenance and Catheter Clearance Protocol for patients with central line (CVC, PICC, Port, Hemodialysis, Trialysis)   If  patient diabetic or glucose greater than 140 notify physician for Sliding Scale Insulin Orders   Intake and Output   Initiate CHG Protocol for patients in ICU/SD or any patient with a central line or foley catheter   Do not place and if present remove PureWick   Initiate Oral Care Protocol   Initiate Carrier Fluid Protocol   RN may order General Admission PRN Orders utilizing General Admission PRN medications (through manage orders) for the following patient needs: allergy symptoms (Claritin), cold sores (Carmex), cough (Robitussin DM), eye irritation (Liquifilm Tears), hemorrhoids (Tucks), indigestion (Maalox), minor skin irritation (Hydrocortisone Cream), muscle pain Lucienne Gay), nose irritation (saline nasal spray) and sore throat (Chloraseptic spray).   Cardiac Monitoring - Continuous Indefinite   Strict intake and output   Full code   Consult for [facility] Admission   Pulse oximetry check with vital signs   Oxygen  therapy Mode or (Route): Nasal cannula; Liters Per Minute: 2; Keep O2 saturation between: greater than 92 %   Incentive spirometry   I-stat chem 8, ED   ED EKG   EKG 12-Lead   Repeat EKG   EKG 12-Lead   Type and screen   Saline lock IV   Admit to Inpatient (patient's expected length of stay will be greater than 2 midnights or inpatient only procedure)    Author: Mario LULLA Blanch, MD 12 pm- 8 pm. [facility]. 03/05/2024 7:28 PM Please note for any communication after hours contact [facility] Assigned provider on call on Amion.

## 2024-03-05 NOTE — Hospital Course (Signed)
 SABRA

## 2024-03-05 NOTE — ED Notes (Signed)
 Patient transported to Ultrasound

## 2024-03-05 NOTE — ED Notes (Signed)
 Patient transported to CT

## 2024-03-05 NOTE — ED Triage Notes (Signed)
 Pt BIB [facility] from home. C/O chest pain x45 mins. 5 previous MI, last one in June. Says pain is different this time. 170/100 on arrival. Nitroglycerin  given without relief. Asprin given  [facility] VS 116 SBP 60 HR 98 RA

## 2024-03-06 DIAGNOSIS — D649 Anemia, unspecified: Secondary | ICD-10-CM | POA: Diagnosis not present

## 2024-03-06 DIAGNOSIS — K7460 Unspecified cirrhosis of liver: Secondary | ICD-10-CM | POA: Diagnosis not present

## 2024-03-06 DIAGNOSIS — E782 Mixed hyperlipidemia: Secondary | ICD-10-CM | POA: Diagnosis not present

## 2024-03-06 DIAGNOSIS — N1832 Chronic kidney disease, stage 3b: Secondary | ICD-10-CM

## 2024-03-06 DIAGNOSIS — J42 Unspecified chronic bronchitis: Secondary | ICD-10-CM

## 2024-03-06 DIAGNOSIS — I5020 Unspecified systolic (congestive) heart failure: Secondary | ICD-10-CM | POA: Diagnosis not present

## 2024-03-06 DIAGNOSIS — K858 Other acute pancreatitis without necrosis or infection: Secondary | ICD-10-CM | POA: Diagnosis not present

## 2024-03-06 DIAGNOSIS — I10 Essential (primary) hypertension: Secondary | ICD-10-CM | POA: Diagnosis not present

## 2024-03-06 LAB — COMPREHENSIVE METABOLIC PANEL WITH GFR
ALT: 49 U/L — ABNORMAL HIGH (ref 0–44)
AST: 105 U/L — ABNORMAL HIGH (ref 15–41)
Albumin: 2.5 g/dL — ABNORMAL LOW (ref 3.5–5.0)
Alkaline Phosphatase: 57 U/L (ref 38–126)
Anion gap: 7 (ref 5–15)
BUN: 19 mg/dL (ref 8–23)
CO2: 22 mmol/L (ref 22–32)
Calcium: 8.3 mg/dL — ABNORMAL LOW (ref 8.9–10.3)
Chloride: 109 mmol/L (ref 98–111)
Creatinine, Ser: 1.53 mg/dL — ABNORMAL HIGH (ref 0.44–1.00)
GFR, Estimated: 37 mL/min — ABNORMAL LOW (ref >60)
Glucose, Bld: 92 mg/dL (ref 70–99)
Potassium: 4.2 mmol/L (ref 3.5–5.1)
Sodium: 138 mmol/L (ref 135–145)
Total Bilirubin: 2.2 mg/dL — ABNORMAL HIGH (ref 0.0–1.2)
Total Protein: 5.2 g/dL — ABNORMAL LOW (ref 6.5–8.1)

## 2024-03-06 LAB — TYPE AND SCREEN
ABO/RH(D): O POS
Antibody Screen: NEGATIVE

## 2024-03-06 LAB — HEPATITIS PANEL, ACUTE
HCV Ab: NONREACTIVE
Hep A IgM: NONREACTIVE
Hep B C IgM: NONREACTIVE
Hepatitis B Surface Ag: NONREACTIVE

## 2024-03-06 LAB — CBC
HCT: 29.9 % — ABNORMAL LOW (ref 36.0–46.0)
Hemoglobin: 9.4 g/dL — ABNORMAL LOW (ref 12.0–15.0)
MCH: 28.7 pg (ref 26.0–34.0)
MCHC: 31.4 g/dL (ref 30.0–36.0)
MCV: 91.4 fL (ref 80.0–100.0)
Platelets: UNDETERMINED K/uL (ref 150–400)
RBC: 3.27 MIL/uL — ABNORMAL LOW (ref 3.87–5.11)
RDW: 16 % — ABNORMAL HIGH (ref 11.5–15.5)
WBC: 3.6 K/uL — ABNORMAL LOW (ref 4.0–10.5)
nRBC: 0 % (ref 0.0–0.2)

## 2024-03-06 LAB — PROTIME-INR
INR: 1.2 (ref 0.8–1.2)
Prothrombin Time: 16.3 s — ABNORMAL HIGH (ref 11.4–15.2)

## 2024-03-06 LAB — MAGNESIUM: Magnesium: 2 mg/dL (ref 1.7–2.4)

## 2024-03-06 LAB — FERRITIN: Ferritin: 18 ng/mL (ref 11–307)

## 2024-03-06 MED ORDER — INFLUENZA VAC SPLIT HIGH-DOSE 0.5 ML IM SUSY
0.5000 mL | PREFILLED_SYRINGE | INTRAMUSCULAR | Status: DC
  Filled 2024-03-06: qty 0.5

## 2024-03-06 MED ORDER — PANTOPRAZOLE SODIUM 40 MG PO TBEC
40.0000 mg | DELAYED_RELEASE_TABLET | Freq: Every day | ORAL | Status: DC
  Administered 2024-03-06 – 2024-03-08 (×3): 40 mg via ORAL
  Filled 2024-03-06 (×3): qty 1

## 2024-03-06 MED ORDER — LACTATED RINGERS IV SOLN: INTRAVENOUS | Status: AC

## 2024-03-06 MED ORDER — ENSURE PLUS HIGH PROTEIN PO LIQD
237.0000 mL | Freq: Two times a day (BID) | ORAL | Status: DC
  Administered 2024-03-06 – 2024-03-08 (×3): 237 mL via ORAL

## 2024-03-06 MED ORDER — BOOST / RESOURCE BREEZE PO LIQD CUSTOM
1.0000 | Freq: Three times a day (TID) | ORAL | Status: DC
  Administered 2024-03-06 (×2): 1 via ORAL

## 2024-03-06 MED ORDER — ADULT MULTIVITAMIN W/MINERALS CH
1.0000 | ORAL_TABLET | Freq: Every day | ORAL | Status: DC
  Administered 2024-03-06 – 2024-03-08 (×3): 1 via ORAL
  Filled 2024-03-06 (×3): qty 1

## 2024-03-06 MED ORDER — PNEUMOCOCCAL 20-VAL CONJ VACC 0.5 ML IM SUSY
0.5000 mL | PREFILLED_SYRINGE | INTRAMUSCULAR | Status: DC
  Filled 2024-03-06: qty 0.5

## 2024-03-06 NOTE — Assessment & Plan Note (Signed)
 Continue statin therapy

## 2024-03-06 NOTE — Assessment & Plan Note (Signed)
Calculated BMI is 34.5

## 2024-03-06 NOTE — Progress Notes (Addendum)
 Initial Nutrition Assessment  DOCUMENTATION CODES:   Obesity unspecified  INTERVENTION:  Diet advancement per MD Recommend slow diet advancement from CLD to FLD to GI Soft diet  Encourage small frequent meals/snacks CLD - Boost Breeze po TID, each supplement provides 250 kcal and 9 grams of protein FLD and above - Ensure Plus High Protein po BID, each supplement provides 350 kcal and 20 grams of protein MVI with minerals daily  If pt not able to progress with diet past NPO/CLD for 7 days, recommend nutrition support   NUTRITION DIAGNOSIS:   Inadequate oral intake related to acute illness (Acute pancreatitis) as evidenced by  (CLD).  GOAL:   Patient will meet greater than or equal to 90% of their needs   MONITOR:   PO intake, Supplement acceptance, Diet advancement, Labs, I & O's, Weight trends  REASON FOR ASSESSMENT:   Malnutrition Screening Tool    ASSESSMENT:  68 y.o. female with past medical history of  HTN, HLD,  CHF, GERD, COPD, history of stress-induced cardiomyopathy, history of NSTEMI (10/2023),  HCAP, CKD stage IIIb. Presented with SOB and pain in between shoulder blades found to have acute pancreatitis.  11/4 - Imaging showed acute pancreatitis, Hepatic steatosis, cirrhotic liver  .  Pt lives at home with her daughter. They all make their own meals. Typically only eats 2 meals per day and a banana for lunch. On lasix  at home since NSTEMI in June of this year. Body weight fluctuates but typically pt reports she stays between 206-215 lbs. No recent weight loss or loss of app. Has had slight nausea and poor po intake since yesterday. Found to have acute pancreatitis. No associated vomiting or abdominal pain. Tolerating clears, had 100% of her breakfast tray this morning.   Discussed diet advancement with pancreatitis. Pt agreeable to trying Boost Breeze and then Ensures. Pt reports being constipated on Ensures when she was trying them at home, pt does report when she was  taking Ensures her lasix  was increased. Likely Ensures were not the cause of constipation rather medications, inadequate fluid and fiber intake were primary causes.  Encouraged low fat, low fiber diet for short term for management of pancreatitis.   Admit weight: 97.1 kg Current weight: 97.1 kg  UBW: 206-215 lbs  Wt Readings from Last 10 Encounters:  03/05/24 97.1 kg  01/11/24 94.7 kg  10/25/23 93.4 kg  10/17/23 98.8 kg  10/10/23 98.1 kg  02/16/22 93.9 kg  10/21/21 93 kg  05/14/20 91.6 kg  05/01/19 94.5 kg  12/27/18 94.4 kg   Average Meal Intake: CLD  Nutritionally Relevant Medications: Scheduled Meds:  feeding supplement  1 Container Oral TID BM   multivitamin with minerals  1 tablet Oral Daily   Labs Reviewed: Creatinine 1.53 Lipase >2500 AST 105 ALT 49 Total Bilirubin 2.2 GFR 37 CBG ranges from 92-102 mg/dL over the last 24 hours HgbA1c 4.6   NUTRITION - FOCUSED PHYSICAL EXAM:  Flowsheet Row Most Recent Value  Orbital Region No depletion  Upper Arm Region No depletion  Thoracic and Lumbar Region No depletion  Buccal Region No depletion  Temple Region No depletion  Clavicle Bone Region No depletion  Clavicle and Acromion Bone Region No depletion  Scapular Bone Region No depletion  Dorsal Hand No depletion  Patellar Region No depletion  Anterior Thigh Region No depletion  Posterior Calf Region No depletion  Edema (RD Assessment) Mild  Hair Reviewed  Eyes Reviewed  Mouth Reviewed  Skin Reviewed  Nails Reviewed  Diet Order:   Diet Order             Diet clear liquid Room service appropriate? Yes; Fluid consistency: Thin  Diet effective now                   EDUCATION NEEDS:   Education needs have been addressed  Skin:  Skin Assessment: Reviewed RN Assessment  Last BM:  11/2 - per pt  Height:   Ht Readings from Last 1 Encounters:  03/05/24 5' 6 (1.676 m)    Weight:   Wt Readings from Last 1 Encounters:  03/05/24 97.1 kg     Ideal Body Weight:  59.1 kg  BMI:  Body mass index is 34.54 kg/m.  Estimated Nutritional Needs:   Kcal:  1900-2100 kcal  Protein:  100-120 gm  Fluid:  >/=1.9L/day  Olivia Kenning, RD Registered Dietitian  See Amion for more information

## 2024-03-06 NOTE — Progress Notes (Signed)
 Report given to [facility] RN. Patient's belongings accounted for and returned to patient. Patient transported to 5C-07 by bed.

## 2024-03-06 NOTE — Assessment & Plan Note (Signed)
 Mild interstitial pancreatitis.   Clinically improving, she has been tolerating well clears. Plan to advance to full liquids Continue as needed analgesics and antiemetics.  Continue IV fluids and antiacids, change from IV to po pantoprazole .

## 2024-03-06 NOTE — Plan of Care (Signed)
  Problem: Clinical Measurements: Goal: Will remain free from infection Outcome: Progressing   Problem: Pain Managment: Goal: General experience of comfort will improve and/or be controlled Outcome: Progressing   Problem: Medication: Goal: Compliance with prescribed medication regimen will improve by discharge Outcome: Progressing   Problem: Physical Regulation: Goal: Hemodynamic stability will return to baseline for the patient by discharge Outcome: Progressing Goal: Diagnostic test results will improve Outcome: Progressing   Problem: Respiratory: Goal: Ability to maintain adequate oxygenation and ventilation will improve by discharge Outcome: Progressing

## 2024-03-06 NOTE — Consult Note (Addendum)
 Consultation  Referring Provider: [facility]/Arrien Primary Care Physician:  Patient, No Pcp Per Primary Gastroenterologist:  unassigned  Reason for Consultation: Acute pancreatitis, anemia  HPI: [name] is a 68 y.o. female with history of hypertension, hyperlipidemia, COPD, prior NSTEMI and Takotsubo cardiomyopathy, chronic kidney disease stage III who presented to the emergency room yesterday with complaints of chest pain which she says was located in the center of her chest and radiated through to her shoulder blades.  It was associated with nausea but no vomiting.  She did have some shortness of breath.  Workup in the emergency room with CT chest abdomen and pelvis no cardiomegaly, there is mosaic attenuation again appreciated in both lower lobes likely due to chronic small airway disease, and atelectasis in the right lower lobe, cirrhotic appearing liver with recanalization of the umbilical vein, no intrahepatic mass noted there are punctate gallstones no gallbladder wall thickening and no biliary ductal dilation, there is evidence of acute peripancreatic fluid and edema within the pancreatic parenchyma and surrounding the head and body, mild splenomegaly and mild wall thickening of the ascending and proximal transverse colon.  Labs on admission with WBC 6.2/hemoglobin 10.5/hematocrit 33.3/platelets 137/MCV 90 Potassium 4.2/BUN 20/creatinine 1.25/albumin 2.8 LFTs were normal on admission Troponins negative Lipase greater than 2800 Cholesterol 133/triglycerides 30 CRP less than 0.5 Stool for occult blood pending  Today-WBC 3.6/hemoglobin 9.4/hematocrit 29.4/platelets clumped BUN 19/creatinine 1.53 albumin 2.5 T. bili 2.2/alk phos 57/AST 105/ALT 49  Patient says she is feeling better today, she says the pain yesterday was a 10 out of 10 and is at most a 4 out of 10 today.  She has been tolerating clear liquids with no increase in pain and would like to try some food.  She has not  had any fever or chills.  No vomiting today. Patient has not had any prior history of pancreatitis had not started any new meds recently has been on a stable regimen. She denies any current EtOH use but says she does have history of heavy EtOH in the past with last alcohol in 2009.  Prior to that time she had been drinking at least 4 beers per day over a 4 to 5-year period. Patient says she had noted from a prior CT scan in her MyChart that she had evidence of cirrhosis but has not had any evaluation for that.  There is no family history of liver disease that she is aware of, patient has not had history of hepatitis.  No prior EGD or colonoscopy   Cardiac cath 10/08/2023 was negative with a EF of 35 to 40%.  Past Medical History:  Diagnosis Date   Anemia    Anxiety    CHF (congestive heart failure) (HCC)    COPD (chronic obstructive pulmonary disease) (HCC)    Dyspnea    Dysrhythmia    GERD (gastroesophageal reflux disease)    GI bleed 05/2016   Hyperlipidemia    Hypertension    Myocardial infarction (HCC)    Nonischemic cardiomyopathy (HCC)    a. stress-induced cardiomyopathy by cath 2010 (EF % at that time) - normal cors 2010 & 2012. b. 2013: echo showing EF of 50-55%. c. 05/2016: NSTEMI with cath showing normal cors and periapical akniesis --> consistent with Takotsubo Cardiomyopathy. EF down to 25-30%.   Obesity    PONV (postoperative nausea and vomiting)    Takotsubo cardiomyopathy    Thyroid disease    Tobacco abuse     Past Surgical History:  Procedure Laterality Date   BREAST LUMPECTOMY WITH RADIOACTIVE SEED LOCALIZATION Left 04/08/2017   Procedure: LEFT BREAST LUMPECTOMY WITH RADIOACTIVE SEED LOCALIZATION;  Surgeon: Ethyl Lenis, MD;  Location: [facility] OR;  Service: General;  Laterality: Left;   CARDIAC CATHETERIZATION  2010   no signficant CAD. EF 30%, apical ballooning, takotsubo cardiomyopathy   CARDIAC CATHETERIZATION N/A 05/18/2016   Procedure: Left Heart Cath and  Coronary Angiography;  Surgeon: Ozell Fell, MD;  Location: [facility] INVASIVE CV LAB;  Service: Cardiovascular;  Laterality: N/A;   RIGHT/LEFT HEART CATH AND CORONARY ANGIOGRAPHY N/A 10/07/2023   Procedure: RIGHT/LEFT HEART CATH AND CORONARY ANGIOGRAPHY;  Surgeon: Elmira Newman PARAS, MD;  Location: [facility] INVASIVE CV LAB;  Service: Cardiovascular;  Laterality: N/A;   TRANSTHORACIC ECHOCARDIOGRAM  05/2011   EF 50-55%    Prior to Admission medications   Medication Sig Start Date End Date Taking? Authorizing Provider  albuterol  (VENTOLIN  HFA) 108 (90 Base) MCG/ACT inhaler Inhale 2 puffs into the lungs every 4 (four) hours as needed for wheezing or shortness of breath. 12/15/23  Yes [provider]  budesonide -formoterol  (BREYNA ) 160-4.5 MCG/ACT inhaler Inhale 2 puffs into the lungs 2 (two) times daily.   Yes [provider]  carvedilol  (COREG ) 3.125 MG tablet Take 1 tablet (3.125 mg total) by mouth 2 (two) times daily. 10/25/23 03/05/24 Yes Weaver, Scott T, PA-C  furosemide  (LASIX ) 40 MG tablet Take 40 mg (1 tablet) twice daily for five days, then once daily in the morning. Patient taking differently: Take 40 mg by mouth daily. 10/17/23  Yes Duke, Jon Garre, PA  ipratropium-albuterol  (DUONEB) 0.5-2.5 (3) MG/3ML SOLN Take 3 mLs by nebulization 4 (four) times daily for 7 days, THEN 3 mLs every 6 (six) hours as needed for up to 28 days (SOB, wheezing). Patient taking differently:  3 mLs every 6 (six) hours as needed for wheezing/sob  (SOB, wheezing). 10/10/23 03/05/24 Yes Laurence Locus, DO  lisinopril  (ZESTRIL ) 5 MG tablet Take 1 tablet (5 mg total) by mouth daily. <PLEASE MAKE APPOINTMENT FOR REFILLS> Patient taking differently: Take 5 mg by mouth daily. 05/11/23  Yes Hilty, Vinie BROCKS, MD  dapagliflozin  propanediol (FARXIGA ) 10 MG TABS tablet Take 10 mg by mouth daily. Patient not taking: Reported on 03/05/2024    [provider]  metoprolol  succinate (TOPROL -XL) 25 MG 24 hr tablet Take 25  mg by mouth daily. Patient not taking: Reported on 03/05/2024    [provider]    Current Facility-Administered Medications  Medication Dose Route Frequency Provider Last Rate Last Admin   acetaminophen  (TYLENOL ) tablet 650 mg  650 mg Oral Q6H PRN Patel, Ekta V, MD   650 mg at 03/06/24 1138   Or   acetaminophen  (TYLENOL ) suppository 650 mg  650 mg Rectal Q6H PRN Patel, Ekta V, MD       albuterol  (PROVENTIL ) (2.5 MG/3ML) 0.083% nebulizer solution 2.5 mg  2.5 mg Inhalation Q4H PRN Patel, Ekta V, MD       atorvastatin  (LIPITOR ) tablet 80 mg  80 mg Oral Daily Patel, Ekta V, MD   80 mg at 03/06/24 0840   feeding supplement (BOOST / RESOURCE BREEZE) liquid 1 Container  1 Container Oral TID BM Arrien, Elidia Sieving, MD   1 Container at 03/06/24 1138   fluticasone  furoate-vilanterol (BREO ELLIPTA ) 200-25 MCG/ACT 1 puff  1 puff Inhalation Daily Tobie Mario GAILS, MD   1 puff at 03/06/24 0840   morphine  (PF) 2 MG/ML injection 2 mg  2 mg Intravenous Q2H PRN  Tobie Mario GAILS, MD   2 mg at 03/05/24 03-18-50   multivitamin with minerals tablet 1 tablet  1 tablet Oral Daily Arrien, Elidia Sieving, MD   1 tablet at 03/06/24 1138   ondansetron  (ZOFRAN ) tablet 4 mg  4 mg Oral Q6H PRN Tobie Mario GAILS, MD       Or   ondansetron  (ZOFRAN ) injection 4 mg  4 mg Intravenous Q6H PRN Patel, Ekta V, MD   4 mg at 03/05/24 1330   pantoprazole  (PROTONIX ) injection 40 mg  40 mg Intravenous Q12H Tobie Mario GAILS, MD   40 mg at 03/06/24 0840    Allergies as of 03/05/2024   (No Known Allergies)    Family History  Problem Relation Age of Onset   Lung cancer Mother        died in Mar 18, 2004   Cancer - Lung Mother    COPD Father    Heart disease Brother        CABG at age 19   Breast cancer Maternal Grandmother     Social History   Socioeconomic History   Marital status: Widowed    Spouse name: Not on file   Number of children: 2   Years of education: Not on file   Highest education level: Not on file  Occupational  History   Not on file  Tobacco Use   Smoking status: Former    Current packs/day: 0.00    Average packs/day: 1 pack/day for 27.9 years (27.9 ttl pk-yrs)    Types: Cigarettes    Start date: 49    Quit date: 04/03/2003    Years since quitting: 20.9   Smokeless tobacco: Never  Vaping Use   Vaping status: Never Used  Substance and Sexual Activity   Alcohol use: No   Drug use: No   Sexual activity: Not Currently  Other Topics Concern   Not on file  Social History Narrative   Not on file   Social Drivers of Health   Financial Resource Strain: Not on file  Food Insecurity: No Food Insecurity (03/06/2024)   Hunger Vital Sign    Worried About Running Out of Food in the Last Year: Never true    Ran Out of Food in the Last Year: Never true  Transportation Needs: No Transportation Needs (03/06/2024)   PRAPARE - Administrator, Civil Service (Medical): No    Lack of Transportation (Non-Medical): No  Physical Activity: Not on file  Stress: Not on file  Social Connections: Socially Isolated (03/06/2024)   Social Connection and Isolation Panel    Frequency of Communication with Friends and Family: More than three times a week    Frequency of Social Gatherings with Friends and Family: Twice a week    Attends Religious Services: Never    Database Administrator or Organizations: No    Attends Banker Meetings: Never    Marital Status: Widowed  Intimate Partner Violence: Not At Risk (03/06/2024)   Humiliation, Afraid, Rape, and Kick questionnaire    Fear of Current or Ex-Partner: No    Emotionally Abused: No    Physically Abused: No    Sexually Abused: No    Review of Systems: Pertinent positive and negative review of systems were noted in the above HPI section.  All other review of systems was otherwise negative.   Physical Exam: Vital signs in last 24 hours: Temp:  [97.5 F (36.4 C)-100.6 F (38.1 C)] 98.1 F (36.7 C) (11/04  0248) Pulse Rate:  [63-90] 74  (11/04 0254) Resp:  [11-91] 17 (11/04 0254) BP: (94-137)/(35-67) 94/53 (11/04 0254) SpO2:  [94 %-100 %] 94 % (11/04 0254)   General:   Alert,  Well-developed, older white female pleasant and cooperative in NAD Head:  Normocephalic and atraumatic. Eyes:  Sclera clear, no icterus.   Conjunctiva pink. Ears:  Normal auditory acuity. Nose:  No deformity, discharge,  or lesions. Mouth:  No deformity or lesions.   Neck:  Supple; no masses or thyromegaly. Lungs:  Clear throughout to auscultation.   No wheezes, crackles, or rhonchi.  Heart:  Regular rate and rhythm; no murmurs, clicks, rubs,  or gallops. Abdomen:  Soft, obese, there is mild tenderness across the hypogastrium, no guarding or rebound, no palpable mass and no palpable hepatosplenomegaly Rectal: Not done Msk:  Symmetrical without gross deformities. . Pulses:  Normal pulses noted. Extremities:  Without clubbing or edema. Neurologic:  Alert and  oriented x4;  grossly normal neurologically. Skin:  Intact without significant lesions or rashes.. Psych:  Alert and cooperative. Normal mood and affect.  Intake/Output from previous day: 11/03 0701 - 11/04 0700 In: 1076.6 [P.O.:30; IV Piggyback:1046.6] Out: 200 [Emesis/NG output:200] Intake/Output this shift: No intake/output data recorded.  Lab Results: Recent Labs    03/05/24 0844 03/05/24 0859 03/06/24 0304  WBC 6.2  --  3.6*  HGB 10.5* 10.9* 9.4*  HCT 33.3* 32.0* 29.9*  PLT 137*  --  PLATELET CLUMPS NOTED ON SMEAR, UNABLE TO ESTIMATE   BMET Recent Labs    03/05/24 0844 03/05/24 0859 03/06/24 0304  NA 139 141 138  K 4.2 4.2 4.2  CL 105 106 109  CO2 25  --  22  GLUCOSE 111* 102* 92  BUN 20 24* 19  CREATININE 1.25* 1.30* 1.53*  CALCIUM  8.8*  --  8.3*   LFT Recent Labs    03/06/24 0304  PROT 5.2*  ALBUMIN 2.5*  AST 105*  ALT 49*  ALKPHOS 57  BILITOT 2.2*   PT/INR No results for input(s): LABPROT, INR in the last 72 hours. Hepatitis Panel No results  for input(s): HEPBSAG, HCVAB, HEPAIGM, HEPBIGM in the last 72 hours.    IMPRESSION:  #36 68 year old white female with acute pancreatitis presenting with chest pain radiating to the back. Etiology of the pancreatitis is not entirely clear, certainly possible this is medication related, she also has small gallstones noted on CT scan though not on ultrasound, normal CBD and had normal LFTs on admission which argue against choledocholithiasis as etiology for acute pancreatitis.  She has been on lisinopril  and furosemide  at home.  Fortunately she does not appear to have severe pancreatitis and is already feeling much better today Note that she has had a bump in creatinine  #2 normocytic anemia-appears chronic, and by today's labs she actually has more of a pancytopenia suspect in setting of underlying cirrhosis  #3 cirrhosis with evidence of portal hypertension with recanalization of the umbilical vein and splenomegaly Etiology of cirrhosis possibly secondary to prior daily EtOH use though denies EtOH since 2009, possible NASH, will workup to rule out autoimmune and inheritable forms of liver disease as well as viral hepatitis.  #4 hypertension #5.  COPD #6.  Chronic kidney disease stage IIIb #7 history of NSTEMI and Takotsubo cardiomyopathy  PLAN: Clear liquid diet, advance to full liquids as tolerates Agree with stopping lisinopril  Patient needs IV fluids in setting of acute pancreatitis and bump in creatinine-restart LR at 100 cc an hour Will  check pro time/INR, then can calculate MELD Chronic viral hepatitis serologies, autoimmune hepatic markers and inheritable hepatic markers have been ordered Check AFP Will need eventual EGD and has not had colonoscopy.  He is do not need to be done in the inpatient setting, and can arrange for outpatient GI follow-up  GI will follow with you    Josean Lycan EsterwoodPA-C  03/06/2024, 12:32 PM

## 2024-03-06 NOTE — Progress Notes (Signed)
 Progress Note   Patient: [name] FMW:[id number] DOB: 10-Jun-1955 DOA: 03/05/2024     1 DOS: the patient was seen and examined on 03/06/2024   Brief hospital course: Mrs. Nazario was admitted to the hospital with the working diagnosis of acute pancreatitis.   68 yo female with the past medical history of hypertension, hyperlipidemia, COPD, and CKD who presented with dyspnea. She had acute onset of dyspnea around 6 am on the day of admission, associated with upper back pain radiated to the frontal chest. She had persistent pain for about 45 minutes, prompting her to call EMS. She was found in pain, with blood pressure 170/100, she received nitroglycerin , aspirin  and was transported to the ED.  On her initial physical examination her blood pressure is 121/67, HR 66. RR 20 and 02 saturation 97% Lungs with no wheezing or rhonchi, heart with S1 and S2 present and regular with no gallops, rubs or murmurs, abdomen with no distention, soft and non tender, no lower extremity edema.   Na 139, K 4.2 Cl 105 bicarbonate 25 glucose 111, bun 20 cr 1,25 Lipase > 2,800  AST 31 and ALT 14  High sensitive troponin 5  Wbc 6.2 hgb 10.5 plt 137   Chest radiograph with no cardiomegaly, and no infiltrates, no effusions. No cardiomegaly.   EKG 55 bpm, normal axis, normal intervals, sinus rhythm with no significant ST segment or T wave changes.   CT chest abdomen and pelvis, with no aortic aneurysm, intramural hematoma or aortic dissection.  Acute interstitial edematous pancreatitis, no well formed or fluid to drain,  Cirrhotic liver with sequale of portal hypertension, including recanalization of the umbilical vein and splenomegaly.  Sigmoid diverticulosis with no acute diverticulitis.   11/04 patient clinically improving on supportive medical therapy. Ok to discontinue telemetry and transfer to medical ward.   Assessment and Plan: * Acute pancreatitis Mild interstitial pancreatitis.   Clinically  improving, she has been tolerating well clears. Plan to advance to full liquids Continue as needed analgesics and antiemetics.  Continue IV fluids and antiacids, change from IV to po pantoprazole .   Essential hypertension Continue to hold on lisinopril  for now to avoid hypotension.  Continue blood pressure monitoring   Hyperlipidemia Continue statin therapy   CKD stage 3b, GFR 30-44 ml/min (HCC) - Baseline scr 1.6-1.7 Baseline serum cr at 1,5   Renal function today with serum cr at 1,53 with K at 4.2 and serum bicarbonate at 22 Na 138 and Mg 2,0   Plan to continue gentle hydration, considering chronic low GFR, will decrease rate to 50 ml per hr Follow up renal and electrolytes in am.  Avoid hypotension and nephrotoxic medications.   COPD (chronic obstructive pulmonary disease) (HCC) No signs of exacerbation Continue oxymetry monitoring Continue bronchodilators   Liver cirrhosis (HCC) 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 (She had alcohol abuse in the past)   No signs of decompensated disease.  AST is 49 and ALT 105, suggestive alcohol consumption, but she denies any recent alcohol intake.  Follow up liver enzymes in am.  Follow up on serologies.   Chronic anemia Chronic thrombocytopenia.   Cell count at 9,4 with plt at 137,  No signs of bleeding, stable cell counts   Obesity, Class I, BMI 30-34.9 Calculated BMI is 34.5         Subjective: Patient is feeling better, has no more chest pain or abdominal pain, no nausea or vomiting, tolerating po well.   Physical Exam: Vitals:  03/06/24 0018 03/06/24 0248 03/06/24 0254 03/06/24 1122  BP: (!) 137/53  (!) 94/53 (!) 137/58  Pulse: 90 75 74 75  Resp: (!) 91 14 17 20   Temp: (!) 100.6 F (38.1 C) 98.1 F (36.7 C)  99.3 F (37.4 C)  TempSrc: Oral Axillary    SpO2: 97% 94% 94% 96%  Weight:      Height:       Neurology awake and alert ENT with mild pallor Cardiovascular with S1 and S2 present and  regular with no gallops, rubs or murmurs Respiratory with no rales or wheezing, no rhonchi  Abdomen with no distention, soft and non tender, protuberant No lower extremity edema   Data Reviewed:    Family Communication: no family at the bedside   Disposition: Status is: Inpatient Remains inpatient appropriate because: recovering pancreatitis   Planned Discharge Destination: Home     Author: Elidia Toribio Furnace, MD 03/06/2024 2:07 PM  For on call review [email or web address].

## 2024-03-06 NOTE — Hospital Course (Addendum)
 [name] was admitted to the hospital with the working diagnosis of acute pancreatitis.   68 yo female with the past medical history of hypertension, hyperlipidemia, COPD, and CKD who presented with dyspnea. She had acute onset of dyspnea around 6 am on the day of admission, associated with upper back pain radiated to the frontal chest. She had persistent pain for about 45 minutes, prompting her to call EMS. She was found in pain, with blood pressure 170/100, she received nitroglycerin , aspirin  and was transported to the ED.  On her initial physical examination her blood pressure is 121/67, HR 66. RR 20 and 02 saturation 97% Lungs with no wheezing or rhonchi, heart with S1 and S2 present and regular with no gallops, rubs or murmurs, abdomen with no distention, soft and non tender, no lower extremity edema.   Na 139, K 4.2 Cl 105 bicarbonate 25 glucose 111, bun 20 cr 1,25 Lipase > 2,800  AST 31 and ALT 14  High sensitive troponin 5  Wbc 6.2 hgb 10.5 plt 137   Chest radiograph with no cardiomegaly, and no infiltrates, no effusions. No cardiomegaly.   EKG 55 bpm, normal axis, normal intervals, sinus rhythm with no significant ST segment or T wave changes.   CT chest abdomen and pelvis, with no aortic aneurysm, intramural hematoma or aortic dissection.  Acute interstitial edematous pancreatitis, no well formed or fluid to drain,  Cirrhotic liver with sequale of portal hypertension, including recanalization of the umbilical vein and splenomegaly.  Sigmoid diverticulosis with no acute diverticulitis.   11/04 patient clinically improving on supportive medical therapy. Ok to discontinue telemetry and transfer to medical ward.

## 2024-03-06 NOTE — Assessment & Plan Note (Signed)
 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 (She had alcohol abuse in the past)   No signs of decompensated disease.  AST is 49 and ALT 105, suggestive alcohol consumption, but she denies any recent alcohol intake.  Follow up liver enzymes in am.  Follow up on serologies.

## 2024-03-06 NOTE — TOC CM/SW Note (Signed)
 Transition of Care [facility]) - Inpatient Brief Assessment   Patient Details  Name: [name] MRN: [id number] Date of Birth: 1956/03/01  Transition of Care [facility]) CM/SW Contact:    Lauraine FORBES Saa, LCSWA Phone Number: 03/06/2024, 3:45 PM   Clinical Narrative:  3:45 PM Per chart review, patient resides at home with child(ren). Patient has insurance and does not have a PCP but is followed by Cardiology. Patient does not have SNF or HH history. Patient has DME ([employer]) history with [employer]. Patient's preferred pharmacy's are [facility] and [facility] [phone] [place]. CSW provided SDOH (social connections) resources. TOC will continue to follow.  Transition of Care Asessment: Insurance and Status: Insurance coverage has been reviewed Patient has primary care physician: No (Followed by Cardiology) Home environment has been reviewed: Private Residence Prior level of function:: N/A Prior/Current Home Services: No current home services Social Drivers of Health Review: SDOH reviewed interventions complete Readmission risk has been reviewed: Yes (Currently Green 14%) Transition of care needs: transition of care needs identified, TOC will continue to follow

## 2024-03-06 NOTE — Assessment & Plan Note (Signed)
 Continue to hold on lisinopril  for now to avoid hypotension.  Continue blood pressure monitoring

## 2024-03-06 NOTE — Assessment & Plan Note (Signed)
 Baseline serum cr at 1,5   Renal function today with serum cr at 1,53 with K at 4.2 and serum bicarbonate at 22 Na 138 and Mg 2,0   Plan to continue gentle hydration, considering chronic low GFR, will decrease rate to 50 ml per hr Follow up renal and electrolytes in am.  Avoid hypotension and nephrotoxic medications.

## 2024-03-06 NOTE — Assessment & Plan Note (Signed)
 Chronic thrombocytopenia.   Cell count at 9,4 with plt at 137,  No signs of bleeding, stable cell counts

## 2024-03-06 NOTE — Discharge Instructions (Signed)

## 2024-03-06 NOTE — Assessment & Plan Note (Signed)
 No signs of exacerbation Continue oxymetry monitoring Continue bronchodilators

## 2024-03-06 NOTE — Plan of Care (Signed)
  Problem: Clinical Measurements: Goal: Will remain free from infection Outcome: Progressing   Problem: Pain Managment: Goal: General experience of comfort will improve and/or be controlled Outcome: Progressing

## 2024-03-07 ENCOUNTER — Inpatient Hospital Stay (HOSPITAL_COMMUNITY)

## 2024-03-07 DIAGNOSIS — R7989 Other specified abnormal findings of blood chemistry: Secondary | ICD-10-CM

## 2024-03-07 DIAGNOSIS — K8020 Calculus of gallbladder without cholecystitis without obstruction: Secondary | ICD-10-CM | POA: Diagnosis not present

## 2024-03-07 DIAGNOSIS — K7460 Unspecified cirrhosis of liver: Secondary | ICD-10-CM | POA: Diagnosis not present

## 2024-03-07 DIAGNOSIS — K858 Other acute pancreatitis without necrosis or infection: Secondary | ICD-10-CM | POA: Diagnosis not present

## 2024-03-07 LAB — CBC
HCT: 28.9 % — ABNORMAL LOW (ref 36.0–46.0)
Hemoglobin: 9.1 g/dL — ABNORMAL LOW (ref 12.0–15.0)
MCH: 28.7 pg (ref 26.0–34.0)
MCHC: 31.5 g/dL (ref 30.0–36.0)
MCV: 91.2 fL (ref 80.0–100.0)
Platelets: 88 K/uL — ABNORMAL LOW (ref 150–400)
RBC: 3.17 MIL/uL — ABNORMAL LOW (ref 3.87–5.11)
RDW: 15.9 % — ABNORMAL HIGH (ref 11.5–15.5)
WBC: 2.8 K/uL — ABNORMAL LOW (ref 4.0–10.5)
nRBC: 0 % (ref 0.0–0.2)

## 2024-03-07 LAB — COMPREHENSIVE METABOLIC PANEL WITH GFR
ALT: 62 U/L — ABNORMAL HIGH (ref 0–44)
AST: 148 U/L — ABNORMAL HIGH (ref 15–41)
Albumin: 2.4 g/dL — ABNORMAL LOW (ref 3.5–5.0)
Alkaline Phosphatase: 65 U/L (ref 38–126)
Anion gap: 9 (ref 5–15)
BUN: 14 mg/dL (ref 8–23)
CO2: 23 mmol/L (ref 22–32)
Calcium: 8.6 mg/dL — ABNORMAL LOW (ref 8.9–10.3)
Chloride: 107 mmol/L (ref 98–111)
Creatinine, Ser: 1.57 mg/dL — ABNORMAL HIGH (ref 0.44–1.00)
GFR, Estimated: 36 mL/min — ABNORMAL LOW (ref >60)
Glucose, Bld: 87 mg/dL (ref 70–99)
Potassium: 4.1 mmol/L (ref 3.5–5.1)
Sodium: 139 mmol/L (ref 135–145)
Total Bilirubin: 2.7 mg/dL — ABNORMAL HIGH (ref 0.0–1.2)
Total Protein: 5.2 g/dL — ABNORMAL LOW (ref 6.5–8.1)

## 2024-03-07 LAB — ANTI-SMOOTH MUSCLE ANTIBODY, IGG: F-Actin IgG: 17 U (ref 0–19)

## 2024-03-07 LAB — ENA+DNA/DS+ANTICH+CENTRO+JO...
Anti JO-1: <0.2 AI (ref 0.0–0.9)
Centromere Ab Screen: <0.2 AI (ref 0.0–0.9)
Chromatin Ab SerPl-aCnc: <0.2 AI (ref 0.0–0.9)
ENA SM Ab Ser-aCnc: <0.2 AI (ref 0.0–0.9)
Ribonucleic Protein: <0.2 AI (ref 0.0–0.9)
SSA (Ro) (ENA) Antibody, IgG: <0.2 AI (ref 0.0–0.9)
SSB (La) (ENA) Antibody, IgG: >8 AI — ABNORMAL HIGH (ref 0.0–0.9)
Scleroderma (Scl-70) (ENA) Antibody, IgG: 0.3 AI (ref 0.0–0.9)
ds DNA Ab: 2 [IU]/mL (ref 0–9)

## 2024-03-07 LAB — MITOCHONDRIAL ANTIBODIES: Mitochondrial M2 Ab, IgG: 35.8 U — ABNORMAL HIGH (ref 0.0–20.0)

## 2024-03-07 LAB — CERULOPLASMIN: Ceruloplasmin: 19.7 mg/dL (ref 19.0–39.0)

## 2024-03-07 LAB — ALPHA-1-ANTITRYPSIN: A-1 Antitrypsin, Ser: 139 mg/dL (ref 101–187)

## 2024-03-07 LAB — ANA W/REFLEX IF POSITIVE: Anti Nuclear Antibody (ANA): POSITIVE — AB

## 2024-03-07 MED ORDER — GADOBUTROL 1 MMOL/ML IV SOLN
10.0000 mL | Freq: Once | INTRAVENOUS | Status: AC | PRN
  Administered 2024-03-07: 10 mL via INTRAVENOUS

## 2024-03-07 MED ORDER — LORAZEPAM 1 MG PO TABS
1.0000 mg | ORAL_TABLET | Freq: Once | ORAL | Status: AC | PRN
  Administered 2024-03-07: 1 mg via ORAL
  Filled 2024-03-07: qty 1

## 2024-03-07 NOTE — Evaluation (Signed)
 Physical Therapy Brief Evaluation and Discharge Note Patient Details Name: [name] MRN: [id number] DOB: 03/10/1956 Today's Date: 03/07/2024   History of Present Illness  Patient is a 68 yo female presenting to the ED with prolonged chest pain on 03/05/24. Admitted with pancreatitis. PMH includes: essential hypertension, hyperlipidemia, COPD, history of stress-induced cardiomyopathy, history of NSTEMI, history of bright red blood per rectum and GI bleed, history of HCAP, history of CKD stage IIIb serum creatinine baseline 1.6-1.7  Clinical Impression  Pt received in supine and agreeable to PT session. Pt appears at baseline as she is independent with all functional mobility. SpO2 remained over 96% throughout session and pt experienced only slight SOB with activity. Able to ambulate 150' no AD, as well as ascend and descend 3 stairs using L handrail and step to pattern, no assist required. No further PT needs anticipated upon d/c.        PT Assessment Patient does not need any further PT services  Assistance Needed at Discharge  None    Equipment Recommendations None recommended by PT  Recommendations for Other Services       Precautions/Restrictions Precautions Precautions: Fall Recall of Precautions/Restrictions: Intact Restrictions Weight Bearing Restrictions Per Provider Order: No        Mobility  Bed Mobility   Supine/Sidelying to sit: HOB elevated, Independent      Transfers Overall transfer level: Independent Equipment used: None                    Ambulation/Gait Ambulation/Gait assistance: Independent Gait Distance (Feet): 150 Feet Assistive device: None Gait Pattern/deviations: WFL(Within Functional Limits) Gait Speed: Pace WFL General Gait Details: Noted slight SOB during gait, but SpO2 remains within normal range.  Home Activity Instructions    Stairs Stairs: Yes Stairs assistance: Independent Stair Management: One rail Left, Step to  pattern, Forwards Number of Stairs: 3 General stair comments: Step to pattern for ascending and descending stairs. No LOB or dec speed noted.  Modified Rankin (Stroke Patients Only)        Balance Overall balance assessment: No apparent balance deficits (not formally assessed)                        Pertinent Vitals/Pain PT - Brief Vital Signs All Vital Signs Stable: Yes (SpO2 remained above 96% throughout) Pain Assessment Pain Assessment: Faces Faces Pain Scale: No hurt     Home Living Family/patient expects to be discharged to:: Private residence Living Arrangements: Children Available Help at Discharge: Family;Available PRN/intermittently Home Environment: Stairs to enter  Stairs-Number of Steps: 14 Home Equipment: Other (comment);Cane - quad (Stair cane)        Prior Function Level of Independence: Independent Comments: No AD, independent with all ADLs    UE/LE Assessment   UE ROM/Strength/Tone/Coordination: WFL    LE ROM/Strength/Tone/Coordination: [facility]      Communication   Communication Communication: No apparent difficulties     Cognition Overall Cognitive Status: Appears within functional limits for tasks assessed/performed       General Comments General comments (skin integrity, edema, etc.): Appears at baseline for functional mobility    Exercises     Assessment/Plan    PT Problem List         PT Visit Diagnosis Other abnormalities of gait and mobility (R26.89)    No Skilled PT All education completed;Patient at baseline level of functioning;Patient is independent with all acitivity/mobility   Co-evaluation  AMPAC 6 Clicks Help needed turning from your back to your side while in a flat bed without using bedrails?: None Help needed moving from lying on your back to sitting on the side of a flat bed without using bedrails?: None Help needed moving to and from a bed to a chair (including a wheelchair)?:  None Help needed standing up from a chair using your arms (e.g., wheelchair or bedside chair)?: None Help needed to walk in hospital room?: None Help needed climbing 3-5 steps with a railing? : None 6 Click Score: 24      End of Session Equipment Utilized During Treatment: Gait belt Activity Tolerance: Patient tolerated treatment well Patient left: in chair;with call bell/phone within reach Nurse Communication: Mobility status PT Visit Diagnosis: Other abnormalities of gait and mobility (R26.89)     Time: 0950-1001 PT Time Calculation (min) (ACUTE ONLY): 11 min  Charges:   PT Evaluation $PT Eval Low Complexity: 1 Low      [name], SPT   [name]  03/07/2024, 11:10 AM

## 2024-03-07 NOTE — Progress Notes (Addendum)
 Patient ID: [name], female   DOB: 06/11/1955, 68 y.o.   MRN: [id number]    Progress Note   Subjective   Day # 2 CC; acute lower chest pain radiating to the back-acute pancreatitis  Labs today-WBC 2.8/hemoglobin 9.1/hematocrit 28.9/platelets 88 Sodium 139/potassium 4.1/BUN 14/creatinine 1.57 T. bili 2.7/alk phos 65/AST 148/ALT 62 Ferritin 18-remainder of hepatic autoimmune markers/inheritable markers pending  MELD 3.0: 20 at 03/07/2024  4:11 AM MELD-Na: 17 at 03/07/2024  4:11 AM Calculated from: Serum Creatinine: 1.57 mg/dL at 88/08/7972  5:88 AM Serum Sodium: 139 mmol/L (Using max of 137 mmol/L) at 03/07/2024  4:11 AM Total Bilirubin: 2.7 mg/dL at 88/08/7972  5:88 AM Serum Albumin: 2.4 g/dL at 88/08/7972  5:88 AM INR(ratio): 1.2 at 03/06/2024  1:31 PM Age at listing (hypothetical): 57 years Sex: Female at 03/07/2024  4:11 AM    Patient is sitting up in the chair, remains on clear liquids, says she did not have any abdominal pain after eating this morning but had been having some pain in her back last night.  No nausea or vomiting.    Objective   Vital signs in last 24 hours: Temp:  [98.4 F (36.9 C)-99.3 F (37.4 C)] 99.3 F (37.4 C) (11/05 0933) Pulse Rate:  [71-75] 73 (11/05 0933) Resp:  [17-20] 17 (11/05 0933) BP: (114-137)/(51-58) 123/52 (11/05 0933) SpO2:  [96 %-98 %] 98 % (11/05 0933) Weight:  [99.3 kg] 99.3 kg (11/04 1635) Last BM Date : 03/05/24 General:   Older white female in NAD Heart:  Regular rate and rhythm; no murmurs Lungs: Respirations even and unlabored, lungs CTA bilaterally Abdomen:  Soft, obese, bowel sounds are present, some mild tenderness across the hypogastrium. Extremities:  Without edema. Neurologic:  Alert and oriented,  grossly normal neurologically. Psych:  Cooperative. Normal mood and affect.  Intake/Output from previous day: 11/04 0701 - 11/05 0700 In: 23 [I.V.:23] Out: -  Intake/Output this shift: No intake/output data  recorded.  Lab Results: Recent Labs    03/05/24 0844 03/05/24 0859 03/06/24 0304 03/07/24 0411  WBC 6.2  --  3.6* 2.8*  HGB 10.5* 10.9* 9.4* 9.1*  HCT 33.3* 32.0* 29.9* 28.9*  PLT 137*  --  PLATELET CLUMPS NOTED ON SMEAR, UNABLE TO ESTIMATE 88*   BMET Recent Labs    03/05/24 0844 03/05/24 0859 03/06/24 0304 03/07/24 0411  NA 139 141 138 139  K 4.2 4.2 4.2 4.1  CL 105 106 109 107  CO2 25  --  22 23  GLUCOSE 111* 102* 92 87  BUN 20 24* 19 14  CREATININE 1.25* 1.30* 1.53* 1.57*  CALCIUM  8.8*  --  8.3* 8.6*   LFT Recent Labs    03/07/24 0411  PROT 5.2*  ALBUMIN 2.4*  AST 148*  ALT 62*  ALKPHOS 65  BILITOT 2.7*   PT/INR Recent Labs    03/06/24 1331  LABPROT 16.3*  INR 1.2    Studies/Results: US  ABDOMEN LIMITED RUQ (LIVER/GB) Result Date: 03/05/2024 CLINICAL DATA:  Pancreatitis. EXAM: ULTRASOUND ABDOMEN LIMITED RIGHT UPPER QUADRANT COMPARISON:  None Available. FINDINGS: Gallbladder: No gallstones or wall thickening visualized. No sonographic Murphy sign noted by sonographer. Common bile duct: Diameter: 4 mm, within normal limits. No intrahepatic biliary duct dilatation. Liver: Diffusely increased in echogenicity. No definite focal lesion. Portal vein is patent on color Doppler imaging with normal direction of blood flow towards the liver. Other: None. IMPRESSION: 1. No acute findings. 2. Hepatic steatosis. Electronically Signed   By: Newell Eke M.D.  On: 03/05/2024 14:33   CT Angio Chest/Abd/Pel for Dissection W and/or Wo Contrast Result Date: 03/05/2024 CLINICAL DATA:  Acute aortic syndrome (AAS) suspected EXAM: CT ANGIOGRAPHY CHEST, ABDOMEN AND PELVIS TECHNIQUE: Non-contrast CT of the chest was initially obtained. Multidetector CT imaging through the chest, abdomen and pelvis was performed using the standard protocol during bolus administration of intravenous contrast. Multiplanar reconstructed images and MIPs were obtained and reviewed to evaluate the vascular  anatomy. RADIATION DOSE REDUCTION: This exam was performed according to the departmental dose-optimization program which includes automated exposure control, adjustment of the mA and/or kV according to patient size and/or use of iterative reconstruction technique. CONTRAST:  OMNIPAQUE  IOHEXOL  350 MG/ML SOLN COMPARISON:  03/05/2024, 02/16/2022 FINDINGS: CTA CHEST FINDINGS Pulmonary Embolism: No pulmonary embolism. Cardiovascular: No cardiomegaly or pericardial effusion. No aortic aneurysm, intramural hematoma, or aortic dissection. Mediastinum/Nodes: No mediastinal mass. Unchanged, calcified lymph nodes throughout the mediastinum and bilateral hilar regions. No mediastinal, hilar, or axillary lymphadenopathy. Lungs/Pleura: The midline trachea and bronchi are patent. Similar fibrolinear scarring within both lung apices and in the left upper lobe. Similar mosaic attenuation again appreciated in both lower lobes, likely due to chronic small airways disease. No focal airspace consolidation, pleural effusion, or pneumothorax. Subsegmental atelectasis in the right lower lobe. Review of the MIP images confirms the above findings. CTA ABDOMEN AND PELVIS FINDINGS VASCULAR Aorta: No aortic aneurysm or aortic dissection. Scattered calcified atherosclerosis. no hemodynamically significant stenosis. Celiac: Patent without acute thrombus, aneurysm, or dissection. No hemodynamically significant stenosis. SMA: Patent without acute thrombus, aneurysm, or dissection. No hemodynamically significant stenosis. Renals: On the right, there are 2 small renal arteries with a single left renal artery. Patent without acute thrombus, aneurysm, or dissection. No hemodynamically significant stenosis. IMA: Patent without acute thrombus, aneurysm, or dissection. Mild stenosis at the ostium from noncalcified plaque. Inflow: Patent without acute thrombus, aneurysm, or dissection. Scattered calcified atherosclerosis throughout without  hemodynamically significant stenosis. Proximal Outflow: The bilateral common femoral and visualized portions of the superficial and profunda femoral arteries are patent without acute thrombus, aneurysm, or dissection. No hemodynamically significant stenosis. Veins: Nondiagnostic evaluation of the veins due to arterial timing of the contrast bolus. Review of the MIP images confirms the above findings. NON-VASCULAR Hepatobiliary: Cirrhotic liver with recanalization of the umbilical vein. No intrahepatic mass visualized.Punctate radiopaque gallstones. No gallbladder wall thickening. No intrahepatic or extrahepatic biliary ductal dilation. Pancreas: No mass or main ductal dilation.Acute peripancreatic fluid and edema interdigitating within the pancreatic parenchyma and surrounding the pancreatic head and downstream body region. No well-formed or drainable fluid collection. Spleen: Mild splenomegaly.  No mass. Adrenals/Urinary Tract: No adrenal masses. Mild renal cortical atrophy bilaterally. No hydronephrosis or nephrolithiasis. The urinary bladder is completely decompressed. Stomach/Bowel: Small hiatal hernia. The stomach is decompressed without focal abnormality. No small bowel wall thickening or inflammation. No small bowel obstruction.Normal appendix. Mild wall thickening of the ascending and proximal transverse colon. Sigmoid colonic diverticulosis. No changes of acute diverticulitis. Lymphatic: No intraabdominal or pelvic lymphadenopathy. Reproductive: Age-related atrophy of the uterus and ovaries. No concerning adnexal mass. No free pelvic fluid. Other: No pneumoperitoneum, ascites, or mesenteric inflammation. Musculoskeletal: No acute fracture or destructive lesion.Developing caput medusa. Osteopenia. 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 Review of the MIP images confirms the above findings. IMPRESSION: 1. No aortic aneurysm, intramural hematoma, or aortic dissection. 2. Acute  interstitial edematous pancreatitis. No well-formed or drainable fluid collection. Correlation with serum lipase recommended. 3. Cirrhotic liver with sequelae of portal hypertension,  including recanalization of the umbilical vein and splenomegaly. Continued biannual HCC surveillance recommended. 4. Mild wall thickening of the right colon, likely reflecting sequelae of portal colopathy. 5. Sigmoid diverticulosis.  No changes of acute diverticulitis. 6. No acute intrathoracic abnormality. No pneumonia, pulmonary edema, or pleural effusion. Aortic Atherosclerosis (ICD10-I70.0). Electronically Signed   By: Rogelia Myers M.D.   On: 03/05/2024 11:01       Assessment / Plan:    #23 68 year old female with acute pancreatitis who presented with chest pain radiating to the back.  No features concerning for severe pancreatitis.  Small gallstones were noted on CT though not on ultrasound, common bile duct normal on admission and also had normal LFTs on admission which argues against choledocholithiasis as cause for the acute pancreatitis.  Occasion induced pancreatitis also considered and for that reason lisinopril  has been stopped  Since admission however she has developed LFTs which are still on the rise today.  This may be due to edema from the pancreatitis but in the setting of pancreatitis and known gallstones now having some concern for potential choledocholithiasis.  #2 hypertension #3.  New diagnosis of cirrhosis with evidence of portal hypertension with recanalization of the umbilical vein and splenomegaly.  Etiology of cirrhosis possibly secondary to previous daily EtOH use which has been inactive since 2009 may have component of fatty liver disease/MASLD-In progress to rule out autoimmune and inheritable forms  #4 COPD #5 chronic kidney disease stage IIIb #6 history of NSTEMI /Takotsubo cardiomyopathy  Plan; advance to low-fat diet Leave off lisinopril  Will proceed with MRI/MRCP  today Follow-up labs in a.m. Planning for eventual outpatient EGD and colonoscopy GI will continue to follow with you     Principal Problem:   Acute pancreatitis Active Problems:   Essential hypertension   Hyperlipidemia   COPD (chronic obstructive pulmonary disease) (HCC)   CKD stage 3b, GFR 30-44 ml/min (HCC) - Baseline scr 1.6-1.7   Obesity, Class I, BMI 30-34.9   Liver cirrhosis (HCC)   Chronic anemia     LOS: 2 days   Talyssa Gibas PA-C 03/07/2024, 9:41 AM

## 2024-03-07 NOTE — Evaluation (Signed)
 Occupational Therapy Evaluation Patient Details Name: [name] MRN: [id number] DOB: 05/05/1955 Today's Date: 03/07/2024   History of Present Illness   Patient is a 68 yo female presenting to the ED with prolonged chest pain on 03/05/24. Admitted with pancreatitis. PMH includes: essential hypertension, hyperlipidemia, COPD, history of stress-induced cardiomyopathy, history of NSTEMI, history of bright red blood per rectum and GI bleed, history of HCAP, history of CKD stage IIIb serum creatinine baseline 1.6-1.7     Clinical Impressions Prior to this admission, patient living with her children who both work, but does have 24 hour support as her children work opposite shifts. She is independent in her ADLs, IADLs, and independent without AD. Currently, patient is back at her baseline, with minimal pain in her upper back between her shoulder blades. Patient with no OT concerns, no deficits, and no need for further OT follow up. OT will sign off at this time, please re-consult if further acute needs arise.     If plan is discharge home, recommend the following:   Help with stairs or ramp for entrance (initially)     Functional Status Assessment   Patient has not had a recent decline in their functional status     Equipment Recommendations   None recommended by OT     Recommendations for Other Services         Precautions/Restrictions   Precautions Precautions: Fall Recall of Precautions/Restrictions: Intact Restrictions Weight Bearing Restrictions Per Provider Order: No     Mobility Bed Mobility Overal bed mobility: Independent                  Transfers Overall transfer level: Independent                        Balance Overall balance assessment: Independent                                         ADL either performed or assessed with clinical judgement   ADL Overall ADL's : At baseline                                        General ADL Comments: Prior to this admission, patient living with her children who both work, but does have 24 hour support as her children work opposite shifts. She is independent in her ADLs, IADLs, and independent without AD. Currently, patient is back at her baseline, with minimal pain in her upper back between her shoulder blades. Patient with no OT concerns, no deficits, and no need for further OT follow up. OT will sign off at this time, please re-consult if further acute needs arise.     Vision Baseline Vision/History: 1 Wears glasses Ability to See in Adequate Light: 0 Adequate Patient Visual Report: No change from baseline Vision Assessment?: No apparent visual deficits     Perception Perception: Not tested       Praxis Praxis: Not tested       Pertinent Vitals/Pain Pain Assessment Pain Assessment: 0-10 Pain Score: 2  Pain Location: upper back between shoulder blades Pain Descriptors / Indicators: Discomfort, Guarding Pain Intervention(s): Limited activity within patient's tolerance, Monitored during session, Repositioned     Extremity/Trunk Assessment Upper Extremity Assessment Upper Extremity Assessment: Right hand dominant;Overall  WFL for tasks assessed   Lower Extremity Assessment Lower Extremity Assessment: Overall WFL for tasks assessed   Cervical / Trunk Assessment Cervical / Trunk Assessment: Normal   Communication Communication Communication: No apparent difficulties   Cognition Arousal: Alert Behavior During Therapy: WFL for tasks assessed/performed Cognition: No apparent impairments                               Following commands: Intact       Cueing  General Comments   Cueing Techniques: Verbal cues  VSS on RA   Exercises     Shoulder Instructions      Home Living Family/patient expects to be discharged to:: Private residence Living Arrangements: Children Available Help at Discharge:  Available 24 hours/day Type of Home: House (split level) Home Access: Stairs to enter [employer] of Steps: 14 Entrance Stairs-Rails: Left Home Layout: One level     Bathroom Shower/Tub: Producer, Television/film/video: Standard     Home Equipment: Other (comment);Cane - quad (stiar walking cane)          Prior Functioning/Environment Prior Level of Function : Driving;Independent/Modified Independent             Mobility Comments: indepedent without AD, occasionally uses a cane ADLs Comments: independent in all aspects    OT Problem List:     OT Treatment/Interventions:        OT Goals(Current goals can be found in the care plan section)   Acute Rehab OT Goals Patient Stated Goal: to go home OT Goal Formulation: With patient Time For Goal Achievement: 03/21/24 Potential to Achieve Goals: Good   OT Frequency:       Co-evaluation              AM-PAC OT 6 Clicks Daily Activity     Outcome Measure Help from another person eating meals?: None Help from another person taking care of personal grooming?: None Help from another person toileting, which includes using toliet, bedpan, or urinal?: None Help from another person bathing (including washing, rinsing, drying)?: None Help from another person to put on and taking off regular upper body clothing?: None Help from another person to put on and taking off regular lower body clothing?: None 6 Click Score: 24   End of Session Nurse Communication: Mobility status  Activity Tolerance: Patient tolerated treatment well Patient left: in bed;with call bell/phone within reach  OT Visit Diagnosis: Other abnormalities of gait and mobility (R26.89)                Time: 9167-9149 OT Time Calculation (min): 18 min Charges:  OT General Charges $OT Visit: 1 Visit OT Evaluation $OT Eval Moderate Complexity: 1 Mod  Ronal Gift E. Kanishk Stroebel, OTR/L Acute Rehabilitation Services [phone]   Ronal Gift Salt 03/07/2024, 9:04 AM

## 2024-03-07 NOTE — Plan of Care (Signed)
  Problem: Clinical Measurements: Goal: Will remain free from infection Outcome: Progressing   Problem: Pain Managment: Goal: General experience of comfort will improve and/or be controlled Outcome: Progressing   Problem: Medication: Goal: Compliance with prescribed medication regimen will improve by discharge Outcome: Progressing   Problem: Physical Regulation: Goal: Hemodynamic stability will return to baseline for the patient by discharge Outcome: Progressing Goal: Diagnostic test results will improve Outcome: Progressing   Problem: Respiratory: Goal: Ability to maintain adequate oxygenation and ventilation will improve by discharge Outcome: Progressing

## 2024-03-07 NOTE — Progress Notes (Signed)
 PROGRESS NOTE  [name] FMW:[id number] DOB: 09-21-1955 DOA: 03/05/2024 PCP: Patient, No Pcp Per   LOS: 2 days   Brief Narrative / Interim history: 68 yo female with the past medical history of hypertension, hyperlipidemia, COPD, and CKD who presented with dyspnea. She had acute onset of dyspnea around 6 am on the day of admission, associated with upper back pain radiated to the frontal chest. She had persistent pain for about 45 minutes, prompting her to call EMS. She was found in pain, with blood pressure 170/100, she received nitroglycerin , aspirin  and was transported to the ED. She was found to have acute pancreatitis and admitted to the hospital. GI consulted.   Subjective / 24h Interval events: Less abdominal pain today, overall feeling better. No nausea / vomiting.   Assesement and Plan: Principal problem Acute pancreatitis - Mild interstitial pancreatitis. GI consulted and following. LFTs are slightly uptrending and MRI / MRCP is being recommended by GI, pending - stopped Lisinopril  as may have been the culprit.    Active problems Essential hypertension - Continue to hold on lisinopril  for now. BP stable   Hyperlipidemia - Continue statin therapy    CKD stage 3b, GFR 30-44 ml/min (HCC) - Baseline scr 1.6-1.7. At baseline now   COPD (chronic obstructive pulmonary disease) (HCC) - No signs of exacerbation   Liver cirrhosis (HCC) - 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 (She had alcohol abuse in the past, quit 15 years ago)    Chronic anemia - Chronic thrombocytopenia. Due to liver disease   Obesity, Class I- Calculated BMI is 34.5   Scheduled Meds:  atorvastatin   80 mg Oral Daily   feeding supplement  237 mL Oral BID BM   fluticasone  furoate-vilanterol  1 puff Inhalation Daily   Influenza vac split trivalent PF  0.5 mL Intramuscular Tomorrow-1000   multivitamin with minerals  1 tablet Oral Daily   pantoprazole   40 mg Oral Daily   pneumococcal  20-valent conjugate vaccine  0.5 mL Intramuscular Tomorrow-1000   Continuous Infusions:  lactated ringers  50 mL/hr at 03/06/24 1424   PRN Meds:.acetaminophen  **OR** acetaminophen , albuterol , LORazepam , morphine  injection, ondansetron  **OR** ondansetron  (ZOFRAN ) IV  Current Outpatient Medications  Medication Instructions   albuterol  (VENTOLIN  HFA) 108 (90 Base) MCG/ACT inhaler 2 puffs, Inhalation, Every 4 hours PRN   budesonide -formoterol  (BREYNA ) 160-4.5 MCG/ACT inhaler 2 puffs, 2 times daily   carvedilol  (COREG ) 3.125 mg, Oral, 2 times daily   dapagliflozin  propanediol (FARXIGA ) 10 mg, Daily   furosemide  (LASIX ) 40 MG tablet Take 40 mg (1 tablet) twice daily for five days, then once daily in the morning.   ipratropium-albuterol  (DUONEB) 0.5-2.5 (3) MG/3ML SOLN Take 3 mLs by nebulization 4 (four) times daily for 7 days, THEN 3 mLs every 6 (six) hours as needed for up to 28 days (SOB, wheezing).   lisinopril  (ZESTRIL ) 5 mg, Oral, Daily, <PLEASE MAKE APPOINTMENT FOR REFILLS>   metoprolol  succinate (TOPROL -XL) 25 mg, Daily    Diet Orders (From admission, onward)     Start     Ordered   03/07/24 1107  DIET SOFT Room service appropriate? Yes; Fluid consistency: Thin  Diet effective now       Question Answer Comment  Room service appropriate? Yes   Fluid consistency: Thin      03/07/24 1106            DVT prophylaxis: SCDs Start: 03/05/24 1257   Lab Results  Component Value Date   PLT 88 (L) 03/07/2024  Code Status: Full Code  Family Communication: no family at bedside   Status is: Inpatient Remains inpatient appropriate because: severity of illness  Level of care: Med-Surg  Consultants:  GI  Objective: Vitals:   03/06/24 0254 03/06/24 1122 03/06/24 1635 03/07/24 0933  BP: (!) 94/53 (!) 137/58 (!) 114/51 (!) 123/52  Pulse: 74 75 71 73  Resp: 17 20 18 17   Temp:  99.3 F (37.4 C) 98.4 F (36.9 C) 99.3 F (37.4 C)  TempSrc:   Oral Oral  SpO2: 94% 96% 97%  98%  Weight:   99.3 kg   Height:   5' 6 (1.676 m)     Intake/Output Summary (Last 24 hours) at 03/07/2024 1321 Last data filed at 03/07/2024 0300 Gross per 24 hour  Intake 23 ml  Output --  Net 23 ml   Wt Readings from Last 3 Encounters:  03/06/24 99.3 kg  01/11/24 94.7 kg  10/25/23 93.4 kg    Examination:  Constitutional: NAD Eyes: no scleral icterus ENMT: Mucous membranes are moist.  Neck: normal, supple Respiratory: clear to auscultation bilaterally, no wheezing, no crackles.  Cardiovascular: Regular rate and rhythm, no murmurs / rubs / gallops. Abdomen: non distended, no tenderness. Bowel sounds positive.  Musculoskeletal: no clubbing / cyanosis.   Data Reviewed: I have independently reviewed following labs and imaging studies   CBC Recent Labs  Lab 03/05/24 0844 03/05/24 0859 03/06/24 0304 03/07/24 0411  WBC 6.2  --  3.6* 2.8*  HGB 10.5* 10.9* 9.4* 9.1*  HCT 33.3* 32.0* 29.9* 28.9*  PLT 137*  --  PLATELET CLUMPS NOTED ON SMEAR, UNABLE TO ESTIMATE 88*  MCV 90.7  --  91.4 91.2  MCH 28.6  --  28.7 28.7  MCHC 31.5  --  31.4 31.5  RDW 16.0*  --  16.0* 15.9*  LYMPHSABS 1.7  --   --   --   MONOABS 0.4  --   --   --   EOSABS 0.1  --   --   --   BASOSABS 0.1  --   --   --     Recent Labs  Lab 03/05/24 0844 03/05/24 0859 03/05/24 1315 03/06/24 0304 03/06/24 1331 03/07/24 0411  NA 139 141  --  138  --  139  K 4.2 4.2  --  4.2  --  4.1  CL 105 106  --  109  --  107  CO2 25  --   --  22  --  23  GLUCOSE 111* 102*  --  92  --  87  BUN 20 24*  --  19  --  14  CREATININE 1.25* 1.30*  --  1.53*  --  1.57*  CALCIUM  8.8*  --   --  8.3*  --  8.6*  AST 32  --   --  105*  --  148*  ALT 14  --   --  49*  --  62*  ALKPHOS 58  --   --  57  --  65  BILITOT 1.1  --   --  2.2*  --  2.7*  ALBUMIN 2.8*  --   --  2.5*  --  2.4*  MG  --   --   --  2.0  --   --   CRP  --   --  <0.5  --   --   --   INR  --   --   --   --  1.2  --      ------------------------------------------------------------------------------------------------------------------ Recent Labs    03/05/24 1315  CHOL 133  HDL 50  LDLCALC 77  TRIG 30  CHOLHDL 2.7    Lab Results  Component Value Date   HGBA1C 4.6 (L) 10/07/2023   ------------------------------------------------------------------------------------------------------------------ No results for input(s): TSH, T4TOTAL, T3FREE, THYROIDAB in the last 72 hours.  Invalid input(s): FREET3  Cardiac Enzymes No results for input(s): CKMB, TROPONINI, MYOGLOBIN in the last 168 hours.  Invalid input(s): CK ------------------------------------------------------------------------------------------------------------------    Component Value Date/Time   BNP 162.3 (H) 10/17/2023 1639   BNP 112.9 (H) 10/07/2023 0238    CBG: No results for input(s): GLUCAP in the last 168 hours.  No results found for this or any previous visit (from the past 240 hours).   Radiology Studies: No results found.   Nilda Fendt, MD, PhD [facility]  Between 7 am - 7 pm I am available, please contact me via Amion (for emergencies) or Securechat (non urgent messages)  Between 7 pm - 7 am I am not available, please contact night coverage MD/APP via Amion

## 2024-03-07 NOTE — Progress Notes (Signed)
 Patient asleep. Asked to get morning vitals later.

## 2024-03-08 ENCOUNTER — Other Ambulatory Visit (HOSPITAL_COMMUNITY): Payer: Self-pay

## 2024-03-08 DIAGNOSIS — K858 Other acute pancreatitis without necrosis or infection: Secondary | ICD-10-CM | POA: Diagnosis not present

## 2024-03-08 LAB — CBC
HCT: 29 % — ABNORMAL LOW (ref 36.0–46.0)
Hemoglobin: 9.3 g/dL — ABNORMAL LOW (ref 12.0–15.0)
MCH: 28.6 pg (ref 26.0–34.0)
MCHC: 32.1 g/dL (ref 30.0–36.0)
MCV: 89.2 fL (ref 80.0–100.0)
Platelets: 86 K/uL — ABNORMAL LOW (ref 150–400)
RBC: 3.25 MIL/uL — ABNORMAL LOW (ref 3.87–5.11)
RDW: 15.9 % — ABNORMAL HIGH (ref 11.5–15.5)
WBC: 3.2 K/uL — ABNORMAL LOW (ref 4.0–10.5)
nRBC: 0 % (ref 0.0–0.2)

## 2024-03-08 LAB — COMPREHENSIVE METABOLIC PANEL WITH GFR
ALT: 56 U/L — ABNORMAL HIGH (ref 0–44)
AST: 133 U/L — ABNORMAL HIGH (ref 15–41)
Albumin: 2.4 g/dL — ABNORMAL LOW (ref 3.5–5.0)
Alkaline Phosphatase: 74 U/L (ref 38–126)
Anion gap: 6 (ref 5–15)
BUN: 14 mg/dL (ref 8–23)
CO2: 24 mmol/L (ref 22–32)
Calcium: 8.8 mg/dL — ABNORMAL LOW (ref 8.9–10.3)
Chloride: 107 mmol/L (ref 98–111)
Creatinine, Ser: 1.57 mg/dL — ABNORMAL HIGH (ref 0.44–1.00)
GFR, Estimated: 36 mL/min — ABNORMAL LOW (ref >60)
Glucose, Bld: 106 mg/dL — ABNORMAL HIGH (ref 70–99)
Potassium: 4.4 mmol/L (ref 3.5–5.1)
Sodium: 137 mmol/L (ref 135–145)
Total Bilirubin: 1.3 mg/dL — ABNORMAL HIGH (ref 0.0–1.2)
Total Protein: 5.4 g/dL — ABNORMAL LOW (ref 6.5–8.1)

## 2024-03-08 LAB — MAGNESIUM: Magnesium: 1.9 mg/dL (ref 1.7–2.4)

## 2024-03-08 MED ORDER — PANTOPRAZOLE SODIUM 40 MG PO TBEC
40.0000 mg | DELAYED_RELEASE_TABLET | Freq: Every day | ORAL | 0 refills | Status: AC
  Filled 2024-03-08: qty 90, 90d supply, fill #0

## 2024-03-08 NOTE — Discharge Summary (Signed)
 Physician Discharge Summary  [name] FMW:[id number] DOB: Jan 08, 1956 DOA: 03/05/2024  PCP: [facility] [address]  Admit date: 03/05/2024 Discharge date: 03/08/2024  Admitted From: home Disposition:  home  Recommendations for Outpatient Follow-up:  Follow up with PCP in 1-2 weeks  Home Health: none Equipment/Devices: none  Discharge Condition: stable CODE STATUS: Full code Diet Orders (From admission, onward)     Start     Ordered   03/07/24 1107  DIET SOFT Room service appropriate? Yes; Fluid consistency: Thin  Diet effective now       Question Answer Comment  Room service appropriate? Yes   Fluid consistency: Thin      03/07/24 1106            Brief Narrative / Interim history: 68 yo female with the past medical history of hypertension, hyperlipidemia, COPD, and CKD who presented with dyspnea. She had acute onset of dyspnea around 6 am on the day of admission, associated with upper back pain radiated to the frontal chest. She had persistent pain for about 45 minutes, prompting her to call EMS. She was found in pain, with blood pressure 170/100, she received nitroglycerin , aspirin  and was transported to the ED. She was found to have acute pancreatitis and admitted to the hospital. GI consulted.  Hospital Course / Discharge diagnoses: Principal problem Acute pancreatitis - Mild interstitial pancreatitis. GI consulted and followed patient while hospitalized.  She was treated conservatively with n.p.o., IV fluids with improvement in her symptoms.  LFTs have been stable, also underwent an MRCP which was negative for cholecystitis or choledocholithiasis.  She is now tolerating a regular diet, discussed with GI and will be discharged home in stable condition.  Her lisinopril  may have been the culprit for pancreatitis and has been discontinued  Active problems Essential hypertension - Continue to hold on lisinopril  for now. BP stable Hyperlipidemia - Continue statin therapy   CKD stage 3b, GFR 30-44 ml/min (HCC) - Baseline scr 1.6-1.7. At baseline now COPD (chronic obstructive pulmonary disease) (HCC) - No signs of exacerbation Liver cirrhosis (HCC) - 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 (She had alcohol abuse in the past, quit 15 years ago).  She will have outpatient follow-up with GI  Chronic anemia - Chronic thrombocytopenia. Due to liver disease Obesity, Class I- Calculated BMI is 34.5   Sepsis ruled out   Discharge Instructions   Allergies as of 03/08/2024   No Known Allergies      Medication List     STOP taking these medications    Farxiga  10 MG Tabs tablet Generic drug: dapagliflozin  propanediol   lisinopril  5 MG tablet Commonly known as: ZESTRIL    metoprolol  succinate 25 MG 24 hr tablet Commonly known as: TOPROL -XL       TAKE these medications    albuterol  108 (90 Base) MCG/ACT inhaler Commonly known as: VENTOLIN  HFA Inhale 2 puffs into the lungs every 4 (four) hours as needed for wheezing or shortness of breath.   Breyna  160-4.5 MCG/ACT inhaler Generic drug: budesonide -formoterol  Inhale 2 puffs into the lungs 2 (two) times daily.   carvedilol  3.125 MG tablet Commonly known as: COREG  Take 1 tablet (3.125 mg total) by mouth 2 (two) times daily.   furosemide  40 MG tablet Commonly known as: LASIX  Take 40 mg (1 tablet) twice daily for five days, then once daily in the morning. What changed:  how much to take how to take this when to take this additional instructions   ipratropium-albuterol  0.5-2.5 (3)  MG/3ML Soln Commonly known as: DUONEB Take 3 mLs by nebulization 4 (four) times daily for 7 days, THEN 3 mLs every 6 (six) hours as needed for up to 28 days (SOB, wheezing). Start taking on: October 10, 2023 What changed: See the new instructions.   pantoprazole  40 MG tablet Commonly known as: PROTONIX  Take 1 tablet (40 mg total) by mouth daily. Start taking on: March 09, 2024        Consultations: GI  Procedures/Studies:  MR ABDOMEN MRCP W WO CONTAST Result Date: 03/07/2024 IMPRESSION: 1. Stable findings of advanced cirrhosis with portal hypertension and splenomegaly. No hepatic lesions. 2. No MR evidence of acute pancreatitis; trace peripancreatic and periduodenal fluid may reflect sequelae of prior inflammation. 3. Pancreatic divisum. 4. No findings of acute cholecystitis and no common bile duct stones. Electronically signed by: Maude Stammer MD 03/07/2024 07:27 PM EST RP Workstation: [id number]   US  ABDOMEN LIMITED RUQ (LIVER/GB) Result Date: 03/05/2024 CLINICAL DATA:  Pancreatitis. EXAM: ULTRASOUND ABDOMEN LIMITED RIGHT UPPER QUADRANT COMPARISON:  None Available. FINDINGS: Gallbladder: No gallstones or wall thickening visualized. No sonographic Murphy sign noted by sonographer. Common bile duct: Diameter: 4 mm, within normal limits. No intrahepatic biliary duct dilatation. Liver: Diffusely increased in echogenicity. No definite focal lesion. Portal vein is patent on color Doppler imaging with normal direction of blood flow towards the liver. Other: None. IMPRESSION: 1. No acute findings. 2. Hepatic steatosis. Electronically Signed   By: Newell Eke M.D.   On: 03/05/2024 14:33   CT Angio Chest/Abd/Pel for Dissection W and/or Wo Contrast Result Date: 03/05/2024 CLINICAL DATA:  Acute aortic syndrome (AAS) suspected EXAM: CT ANGIOGRAPHY CHEST, ABDOMEN AND PELVIS TECHNIQUE: Non-contrast CT of the chest was initially obtained. Multidetector CT imaging through the chest, abdomen and pelvis was performed using the standard protocol during bolus administration of intravenous contrast. Multiplanar reconstructed images and MIPs were obtained and reviewed to evaluate the vascular anatomy. RADIATION DOSE REDUCTION: This exam was performed according to the departmental dose-optimization program which includes automated exposure control, adjustment of the mA and/or kV according to  patient size and/or use of iterative reconstruction technique. CONTRAST:  OMNIPAQUE  IOHEXOL  350 MG/ML SOLN COMPARISON:  03/05/2024, 02/16/2022 FINDINGS: CTA CHEST FINDINGS Pulmonary Embolism: No pulmonary embolism. Cardiovascular: No cardiomegaly or pericardial effusion. No aortic aneurysm, intramural hematoma, or aortic dissection. Mediastinum/Nodes: No mediastinal mass. Unchanged, calcified lymph nodes throughout the mediastinum and bilateral hilar regions. No mediastinal, hilar, or axillary lymphadenopathy. Lungs/Pleura: The midline trachea and bronchi are patent. Similar fibrolinear scarring within both lung apices and in the left upper lobe. Similar mosaic attenuation again appreciated in both lower lobes, likely due to chronic small airways disease. No focal airspace consolidation, pleural effusion, or pneumothorax. Subsegmental atelectasis in the right lower lobe. Review of the MIP images confirms the above findings. CTA ABDOMEN AND PELVIS FINDINGS VASCULAR Aorta: No aortic aneurysm or aortic dissection. Scattered calcified atherosclerosis. no hemodynamically significant stenosis. Celiac: Patent without acute thrombus, aneurysm, or dissection. No hemodynamically significant stenosis. SMA: Patent without acute thrombus, aneurysm, or dissection. No hemodynamically significant stenosis. Renals: On the right, there are 2 small renal arteries with a single left renal artery. Patent without acute thrombus, aneurysm, or dissection. No hemodynamically significant stenosis. IMA: Patent without acute thrombus, aneurysm, or dissection. Mild stenosis at the ostium from noncalcified plaque. Inflow: Patent without acute thrombus, aneurysm, or dissection. Scattered calcified atherosclerosis throughout without hemodynamically significant stenosis. Proximal Outflow: The bilateral common femoral and visualized portions of the superficial and profunda femoral  arteries are patent without acute thrombus, aneurysm, or  dissection. No hemodynamically significant stenosis. Veins: Nondiagnostic evaluation of the veins due to arterial timing of the contrast bolus. Review of the MIP images confirms the above findings. NON-VASCULAR Hepatobiliary: Cirrhotic liver with recanalization of the umbilical vein. No intrahepatic mass visualized.Punctate radiopaque gallstones. No gallbladder wall thickening. No intrahepatic or extrahepatic biliary ductal dilation. Pancreas: No mass or main ductal dilation.Acute peripancreatic fluid and edema interdigitating within the pancreatic parenchyma and surrounding the pancreatic head and downstream body region. No well-formed or drainable fluid collection. Spleen: Mild splenomegaly.  No mass. Adrenals/Urinary Tract: No adrenal masses. Mild renal cortical atrophy bilaterally. No hydronephrosis or nephrolithiasis. The urinary bladder is completely decompressed. Stomach/Bowel: Small hiatal hernia. The stomach is decompressed without focal abnormality. No small bowel wall thickening or inflammation. No small bowel obstruction.Normal appendix. Mild wall thickening of the ascending and proximal transverse colon. Sigmoid colonic diverticulosis. No changes of acute diverticulitis. Lymphatic: No intraabdominal or pelvic lymphadenopathy. Reproductive: Age-related atrophy of the uterus and ovaries. No concerning adnexal mass. No free pelvic fluid. Other: No pneumoperitoneum, ascites, or mesenteric inflammation. Musculoskeletal: No acute fracture or destructive lesion.Developing caput medusa. Osteopenia. 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 Review of the MIP images confirms the above findings. IMPRESSION: 1. No aortic aneurysm, intramural hematoma, or aortic dissection. 2. Acute interstitial edematous pancreatitis. No well-formed or drainable fluid collection. Correlation with serum lipase recommended. 3. Cirrhotic liver with sequelae of portal hypertension, including recanalization of  the umbilical vein and splenomegaly. Continued biannual HCC surveillance recommended. 4. Mild wall thickening of the right colon, likely reflecting sequelae of portal colopathy. 5. Sigmoid diverticulosis.  No changes of acute diverticulitis. 6. No acute intrathoracic abnormality. No pneumonia, pulmonary edema, or pleural effusion. Aortic Atherosclerosis (ICD10-I70.0). Electronically Signed   By: Rogelia Myers M.D.   On: 03/05/2024 11:01   DG Chest Portable 1 View Result Date: 03/05/2024 EXAM: 1 VIEW(S) XRAY OF THE CHEST 03/05/2024 08:48:00 AM COMPARISON: 10/07/2023 CLINICAL HISTORY: chest/back pain FINDINGS: LUNGS AND PLEURA: Chronic linear atelectasis/scarring of left upper lobe. Possible retrocardiac airspace opacity. No pulmonary edema. No pleural effusion. No pneumothorax. HEART AND MEDIASTINUM: Calcified mediastinal lymph nodes. Aortic calcification. BONES AND SOFT TISSUES: No acute osseous abnormality. IMPRESSION: 1. Possible retrocardiac airspace opacity, suspicious for pneumonia or atelectasis. Electronically signed by: Waddell Calk MD 03/05/2024 09:04 AM EST RP Workstation: [id number]     Subjective: - no chest pain, shortness of breath, no abdominal pain, nausea or vomiting.   Discharge Exam: BP 125/76 (BP Location: Left Arm)   Pulse 64   Temp 98.6 F (37 C) (Oral)   Resp 18   Ht 5' 6 (1.676 m)   Wt 99.3 kg   SpO2 98%   BMI 35.33 kg/m   General: Pt is alert, awake, not in acute distress Cardiovascular: RRR, S1/S2 +, no rubs, no gallops Respiratory: CTA bilaterally, no wheezing, no rhonchi Abdominal: Soft, NT, ND, bowel sounds + Extremities: no edema, no cyanosis    The results of significant diagnostics from this hospitalization (including imaging, microbiology, ancillary and laboratory) are listed below for reference.     Microbiology: No results found for this or any previous visit (from the past 240 hours).   Labs: Basic Metabolic Panel: Recent Labs  Lab  03/05/24 0844 03/05/24 0859 03/06/24 0304 03/07/24 0411 03/08/24 0407  NA 139 141 138 139 137  K 4.2 4.2 4.2 4.1 4.4  CL 105 106 109 107 107  CO2 25  --  22 23 24   GLUCOSE 111* 102* 92 87 106*  BUN 20 24* 19 14 14   CREATININE 1.25* 1.30* 1.53* 1.57* 1.57*  CALCIUM  8.8*  --  8.3* 8.6* 8.8*  MG  --   --  2.0  --  1.9   Liver Function Tests: Recent Labs  Lab 03/05/24 0844 03/06/24 0304 03/07/24 0411 03/08/24 0407  AST 32 105* 148* 133*  ALT 14 49* 62* 56*  ALKPHOS 58 57 65 74  BILITOT 1.1 2.2* 2.7* 1.3*  PROT 6.0* 5.2* 5.2* 5.4*  ALBUMIN 2.8* 2.5* 2.4* 2.4*   CBC: Recent Labs  Lab 03/05/24 0844 03/05/24 0859 03/06/24 0304 03/07/24 0411 03/08/24 0407  WBC 6.2  --  3.6* 2.8* 3.2*  NEUTROABS 3.9  --   --   --   --   HGB 10.5* 10.9* 9.4* 9.1* 9.3*  HCT 33.3* 32.0* 29.9* 28.9* 29.0*  MCV 90.7  --  91.4 91.2 89.2  PLT 137*  --  PLATELET CLUMPS NOTED ON SMEAR, UNABLE TO ESTIMATE 88* 86*   CBG: No results for input(s): GLUCAP in the last 168 hours. Hgb A1c No results for input(s): HGBA1C in the last 72 hours. Lipid Profile Recent Labs    03/05/24 1315  CHOL 133  HDL 50  LDLCALC 77  TRIG 30  CHOLHDL 2.7   Thyroid function studies No results for input(s): TSH, T4TOTAL, T3FREE, THYROIDAB in the last 72 hours.  Invalid input(s): FREET3 Urinalysis No results found for: COLORURINE, APPEARANCEUR, LABSPEC, PHURINE, GLUCOSEU, HGBUR, BILIRUBINUR, KETONESUR, PROTEINUR, UROBILINOGEN, NITRITE, LEUKOCYTESUR  FURTHER DISCHARGE INSTRUCTIONS:   Get Medicines reviewed and adjusted: Please take all your medications with you for your next visit with your Primary MD   Laboratory/radiological data: Please request your Primary MD to go over all hospital tests and procedure/radiological results at the follow up, please ask your Primary MD to get all Hospital records sent to his/her office.   In some cases, they will be blood work, cultures  and biopsy results pending at the time of your discharge. Please request that your primary care M.D. goes through all the records of your hospital data and follows up on these results.   Also Note the following: If you experience worsening of your admission symptoms, develop shortness of breath, life threatening emergency, suicidal or homicidal thoughts you must seek medical attention immediately by calling 911 or calling your MD immediately  if symptoms less severe.   You must read complete instructions/literature along with all the possible adverse reactions/side effects for all the Medicines you take and that have been prescribed to you. Take any new Medicines after you have completely understood and accpet all the possible adverse reactions/side effects.    Do not drive when taking Pain medications or sleeping medications (Benzodaizepines)   Do not take more than prescribed Pain, Sleep and Anxiety Medications. It is not advisable to combine anxiety,sleep and pain medications without talking with your primary care practitioner   Special Instructions: If you have smoked or chewed Tobacco  in the last 2 yrs please stop smoking, stop any regular Alcohol  and or any Recreational drug use.   Wear Seat belts while driving.   Please note: You were cared for by a hospitalist during your hospital stay. Once you are discharged, your primary care physician will handle any further medical issues. Please note that NO REFILLS for any discharge medications will be authorized once you are discharged, as it is imperative that you return to your primary care  physician (or establish a relationship with a primary care physician if you do not have one) for your post hospital discharge needs so that they can reassess your need for medications and monitor your lab values.  Time coordinating discharge: 35 minutes  SIGNED:  Nilda Fendt, MD, PhD 03/08/2024, 9:27 AM

## 2024-03-08 NOTE — Progress Notes (Signed)
 Reviewed AVS, patient expressed understanding of medications, MD follow up reviewed.   See LDA for information on wounds at discharge. Patient states all belongings brought to the hospital at time of admission are accounted for and packed to take home.  Picked up medications from [facility] pharmacy. Patient is currently waiting for son to bring clothes before being escorted out.  Primary nurse notified.

## 2024-03-08 NOTE — TOC Transition Note (Signed)
 Transition of Care [facility]) - Discharge Note   Patient Details  Name: [name] MRN: [id number] Date of Birth: 1956-04-25  Transition of Care [facility]) CM/SW Contact:  Roxie KANDICE Stain, RN Phone Number: 03/08/2024, 10:59 AM   Clinical Narrative:    [name] is stable to discharge home. Follow up apt on AVS. No ICM (Inpatient Care Management) needs at this time.   Final next level of care: Home/Self Care Barriers to Discharge: Barriers Resolved   Patient Goals and CMS Choice Patient states their goals for this hospitalization and ongoing recovery are:: return home          Discharge Placement             home          Discharge Plan and Services Additional resources added to the After Visit Summary for                                       Social Drivers of Health (SDOH) Interventions SDOH Screenings   Food Insecurity: No Food Insecurity (03/06/2024)  Housing: Low Risk  (03/06/2024)  Transportation Needs: No Transportation Needs (03/06/2024)  Utilities: Not At Risk (03/06/2024)  Social Connections: Socially Isolated (03/06/2024)  Tobacco Use: Medium Risk (03/05/2024)     Readmission Risk Interventions    03/08/2024   10:58 AM  Readmission Risk Prevention Plan  Transportation Screening Complete  PCP or Specialist Appt within 5-7 Days Complete  Home Care Screening Complete  Medication Review (RN CM) Complete

## 2024-03-09 LAB — AFP TUMOR MARKER: AFP, Serum, Tumor Marker: 2 ng/mL (ref 0.0–9.2)

## 2024-03-09 NOTE — Care Management Important Message (Signed)
 Important Message  Patient Details  Name: [name] MRN: [id number] Date of Birth: January 18, 1956   Important Message Given:  Yes - Medicare IM Patient left prior to IM delivery will mail a copy to the patient home address     Claretta Deed 03/09/2024, 10:48 AM

## 2024-03-10 ENCOUNTER — Ambulatory Visit: Payer: Self-pay | Admitting: Physician Assistant

## 2024-03-13 ENCOUNTER — Telehealth: Payer: Self-pay | Admitting: Pharmacy Technician

## 2024-03-13 ENCOUNTER — Encounter

## 2024-03-13 NOTE — Telephone Encounter (Signed)
   [employer]  [place]MENT

## 2024-03-14 ENCOUNTER — Ambulatory Visit: Admitting: Internal Medicine

## 2024-04-20 ENCOUNTER — Ambulatory Visit: Payer: Self-pay | Admitting: Nurse Practitioner

## 2024-04-20 ENCOUNTER — Other Ambulatory Visit

## 2024-04-20 ENCOUNTER — Ambulatory Visit: Admitting: Nurse Practitioner

## 2024-04-20 ENCOUNTER — Encounter: Payer: Self-pay | Admitting: Nurse Practitioner

## 2024-04-20 VITALS — BP 128/68 | HR 70 | Ht 66.0 in | Wt 200.5 lb

## 2024-04-20 DIAGNOSIS — K7030 Alcoholic cirrhosis of liver without ascites: Secondary | ICD-10-CM

## 2024-04-20 DIAGNOSIS — R7689 Other specified abnormal immunological findings in serum: Secondary | ICD-10-CM | POA: Diagnosis not present

## 2024-04-20 DIAGNOSIS — K8500 Idiopathic acute pancreatitis without necrosis or infection: Secondary | ICD-10-CM

## 2024-04-20 DIAGNOSIS — K859 Acute pancreatitis without necrosis or infection, unspecified: Secondary | ICD-10-CM

## 2024-04-20 DIAGNOSIS — D649 Anemia, unspecified: Secondary | ICD-10-CM

## 2024-04-20 DIAGNOSIS — D696 Thrombocytopenia, unspecified: Secondary | ICD-10-CM | POA: Diagnosis not present

## 2024-04-20 DIAGNOSIS — F1011 Alcohol abuse, in remission: Secondary | ICD-10-CM

## 2024-04-20 DIAGNOSIS — K766 Portal hypertension: Secondary | ICD-10-CM

## 2024-04-20 DIAGNOSIS — R161 Splenomegaly, not elsewhere classified: Secondary | ICD-10-CM

## 2024-04-20 LAB — COMPREHENSIVE METABOLIC PANEL WITH GFR
ALT: 13 U/L (ref 3–35)
AST: 24 U/L (ref 5–37)
Albumin: 3.9 g/dL (ref 3.5–5.2)
Alkaline Phosphatase: 54 U/L (ref 39–117)
BUN: 36 mg/dL — ABNORMAL HIGH (ref 6–23)
CO2: 28 meq/L (ref 19–32)
Calcium: 9.9 mg/dL (ref 8.4–10.5)
Chloride: 102 meq/L (ref 96–112)
Creatinine, Ser: 1.91 mg/dL — ABNORMAL HIGH (ref 0.40–1.20)
GFR: 26.88 mL/min — ABNORMAL LOW
Glucose, Bld: 90 mg/dL (ref 70–99)
Potassium: 3.4 meq/L — ABNORMAL LOW (ref 3.5–5.1)
Sodium: 140 meq/L (ref 135–145)
Total Bilirubin: 1.4 mg/dL — ABNORMAL HIGH (ref 0.2–1.2)
Total Protein: 7.1 g/dL (ref 6.0–8.3)

## 2024-04-20 LAB — CBC WITH DIFFERENTIAL/PLATELET
Basophils Absolute: 0.1 K/uL (ref 0.0–0.1)
Basophils Relative: 1 % (ref 0.0–3.0)
Eosinophils Absolute: 0 K/uL (ref 0.0–0.7)
Eosinophils Relative: 0.5 % (ref 0.0–5.0)
HCT: 38.5 % (ref 36.0–46.0)
Hemoglobin: 13 g/dL (ref 12.0–15.0)
Lymphocytes Relative: 27.5 % (ref 12.0–46.0)
Lymphs Abs: 1.7 K/uL (ref 0.7–4.0)
MCHC: 33.8 g/dL (ref 30.0–36.0)
MCV: 87.1 fl (ref 78.0–100.0)
Monocytes Absolute: 0.6 K/uL (ref 0.1–1.0)
Monocytes Relative: 9.3 % (ref 3.0–12.0)
Neutro Abs: 3.9 K/uL (ref 1.4–7.7)
Neutrophils Relative %: 61.7 % (ref 43.0–77.0)
Platelets: 150 K/uL (ref 150.0–400.0)
RBC: 4.42 Mil/uL (ref 3.87–5.11)
RDW: 17 % — ABNORMAL HIGH (ref 11.5–15.5)
WBC: 6.3 K/uL (ref 4.0–10.5)

## 2024-04-20 LAB — PROTIME-INR
INR: 1.3 ratio — ABNORMAL HIGH (ref 0.8–1.0)
Prothrombin Time: 13.3 s — ABNORMAL HIGH (ref 9.6–13.1)

## 2024-04-20 LAB — LIPASE: Lipase: 23 U/L (ref 11.0–59.0)

## 2024-04-20 LAB — IRON: Iron: 103 ug/dL (ref 42–145)

## 2024-04-20 NOTE — Progress Notes (Signed)
 "    04/20/2024 [name] [id number] 06-13-1955   Chief Complaint: Hospital follow-up for pancreatitis and newly diagnosed cirrhosis  History of Present Illness: [name]. [name] is a 68 year old female with Takotsubo cardiomyopathy with depressed ejection fraction, hypertension, hyperlipidemia, COPD, CKD and cirrhosis initially identified per CTA 01/2022.   She was admitted to the hospital 03/05/2024 for dyspnea with associated upper chest and back pain.  Admission labs showed a WBC count of 6.2.  Hemoglobin 10.5 which is baseline.  Platelets 137. BUN 20.  Creatinine 1.25. Normal LFTs. Albumin 2.8. Lipase > 2,800.  CTA showed acute interstitial pancreatitis, cirrhotic liver with sequela portal hypertension with recannulization of the umbilical vein and splenomegaly, mild right colon wall thickening likely portal colopathy and sigmoid diverticulosis.  RUQ sonogram showed hepatic steatosis and a normal gallbladder without biliary ductal dilatation.  Abdominal MRI/MRCP with and without contrast did not identify any evidence of acute pancreatitis and showed pancreatic divisum, cirrhosis with portal venous hypertension, no hepatomas, splenomegaly, and small amount of pericholecystic fluid with moderate gallbladder sludge without evidence of acute cholecystitis or choledocholithiasis.  Her LFTs up trended  T. Bili 2.2 -> 2.7 -> 1.3. AST 105 -> 148 -> 133. ALT 49 -> 62 -> 56. However, her abdominal pain continued to improve and there was no indication for a cholecystectomy.  She was evaluated by our inpatient GI service with recommendations for supportive treatment for her pancreatitis and outpatient evaluation for cirrhosis. She was discharged home 03/08/2024.  She presents today for further follow-up regarding pancreatitis and cirrhosis.  She is accompanied by her son.  She denies having any nausea or vomiting.  No further abdominal pain since she was discharged home from the hospital.  She endorses  having pain below her neck and she took 2 ibuprofen for the past few days.  No GERD symptoms or dysphagia.  She is passing normal formed brown stool every other day.  No bloody or black stools.  Never had an EGD or screening colonoscopy.  Last alcohol intake was 15 years ago.  She denies any issues with confusion.  In review of her epic records, she underwent a chest CTA 02/16/2022 in the ED due to having chest pain and shortness of breath was negative for PE, showed a stable right lower lung nodule with interval development of cirrhosis and splenomegaly.  Patient stated she was unaware of these results.  She left AMA before seeing the ED provider.      Latest Ref Rng & Units 03/08/2024    4:07 AM 03/07/2024    4:11 AM 03/06/2024    3:04 AM  CBC  WBC 4.0 - 10.5 K/uL 3.2  2.8  3.6   Hemoglobin 12.0 - 15.0 g/dL 9.3  9.1  9.4   Hematocrit 36.0 - 46.0 % 29.0  28.9  29.9   Platelets 150 - 400 K/uL 86  88  PLATELET CLUMPS NOTED ON SMEAR, UNABLE TO ESTIMATE         Latest Ref Rng & Units 03/08/2024    4:07 AM 03/07/2024    4:11 AM 03/06/2024    3:04 AM  CMP  Glucose 70 - 99 mg/dL 893  87  92   BUN 8 - 23 mg/dL 14  14  19    Creatinine 0.44 - 1.00 mg/dL 8.42  8.42  8.46   Sodium 135 - 145 mmol/L 137  139  138   Potassium 3.5 - 5.1 mmol/L 4.4  4.1  4.2   Chloride  98 - 111 mmol/L 107  107  109   CO2 22 - 32 mmol/L 24  23  22    Calcium  8.9 - 10.3 mg/dL 8.8  8.6  8.3   Total Protein 6.5 - 8.1 g/dL 5.4  5.2  5.2   Total Bilirubin 0.0 - 1.2 mg/dL 1.3  2.7  2.2   Alkaline Phos 38 - 126 U/L 74  65  57   AST 15 - 41 U/L 133  148  105   ALT 0 - 44 U/L 56  62  49     MELD 3.0: 16 at 03/08/2024  4:07 AM MELD-Na: 14 at 03/08/2024  4:07 AM Calculated from: Serum Creatinine: 1.57 mg/dL at 88/07/7972  5:92 AM Serum Sodium: 137 mmol/L at 03/08/2024  4:07 AM Total Bilirubin: 1.3 mg/dL at 88/07/7972  5:92 AM Serum Albumin: 2.4 g/dL at 88/07/7972  5:92 AM INR(ratio): 1.2 at 03/06/2024  1:31 PM Age at listing  (hypothetical): 89 years Sex: Female at 03/08/2024  4:07 AM  ANA positive. SMA 17. AMA 35.8. Ceruloplasmin 19.7. A1AT 139. AFP 2.0. Acute hepatitis panel negative.   Medications Ordered Prior to Encounter[1] Allergies[2]  Current Medications, Allergies, Past Medical History, Past Surgical History, Family History and Social History were reviewed in [employer] record.  Review of Systems:   Constitutional: Negative for fever, sweats, chills or weight loss.  Respiratory: Negative for shortness of breath.   Cardiovascular: Negative for chest pain, palpitations and leg swelling.  Gastrointestinal: See HPI.  Musculoskeletal: Negative for back pain or muscle aches.  Neurological: Negative for dizziness, headaches or paresthesias.   Physical Exam: BP 128/68   Pulse 70   Ht 5' 6 (1.676 m)   Wt 200 lb 8 oz (90.9 kg)   BMI 32.36 kg/m   General: 68 year old female in no acute distress. Head: Normocephalic and atraumatic. Eyes: No scleral icterus. Conjunctiva pink . Ears: Normal auditory acuity. Mouth: Dentures intact.  No ulcers or lesions.  Lungs: Clear throughout to auscultation. Heart: Regular rate and rhythm, no murmur. Abdomen: Soft, nontender and nondistended. No masses or hepatomegaly. Normal bowel sounds x 4 quadrants.  No ascites. Rectal: Deferred.  Musculoskeletal: Symmetrical with no gross deformities. Extremities: No edema. Neurological: Alert oriented x 4. No focal deficits.  No asterixis. Psychological: Alert and cooperative. Normal mood and affect  Assessment and Recommendations:  68 year old female recently admitted to the hospital 03/05/2024 with chest pain radiating to the back, diagnosed with acute pancreatitis of unclear etiology (possibly secondary to pancreatic divisum). CTA showed acute interstitial pancreatitis, cirrhotic liver with sequela portal hypertension with recannulization of the umbilical vein and splenomegaly. RUQ sonogram showed  hepatic steatosis and a normal gallbladder without biliary ductal dilatation. Abdominal MRI/MRCP w/wo contrast did not identify any evidence of acute pancreatitis and showed pancreatic divisum, cirrhosis with portal venous hypertension, splenomegaly, small pericholecystic fluid with moderate gallbladder sludge without evidence of acute cholecystitis or choledocholithiasis. No further N/V or chest/back/abdominal pain.  - Lipase  Recently diagnosed with cirrhosis with portal hypertension and splenomegaly per imaging during hospital admission 11/3 as noted above.  ANA positive with elevated AMA 35.8 concerning for primary biliary cholangitis (PBC).  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  AFP 2.0. Acute hepatitis panel negative. MELD 3.0: 16.  - CBC, CMP, PT/INR, IgG, hepatitis A total antibody, hepatitis B core total antibody, hepatitis B surface antibody - Patient will require hepatitis A and B vaccinations if not immune - To consider a liver biopsy to rule out  PBC, to discuss further with Dr. Pyrtle - EGD to survey for esophageal/gastric varices benefits and risks discussed including risk with sedation, risk of bleeding, perforation and infection  - Patient instructed to avoid NSAIDs - Patient instructed to reduce carbohydrates in diet i.e. bread/pasta/rice/potatoes and sweets - Increase protein in diet - RUQ sonogram and AFP due 08/2024   Normocytic anemia.  No overt GI bleeding. - CBC and iron level   Thrombocytopenia secondary to cirrhosis and splenomegaly    Cirrhosis with evidence of portal hypertension with recanalization of the umbilical vein and splenomegaly  Remote alcohol use disorder, abstinent x 15 years - Continue complete alcohol abstinence  Colon cancer screening.  Patient denies ever having a screening colonoscopy. - Colonoscopy at time of EGD benefits and risks discussed including risk with sedation, risk of bleeding, perforation and infection   COPD  Chronic kidney disease stage  IIIb  History of NSTEMI and Takotsubo cardiomyopathy       [1]  Current Outpatient Medications on File Prior to Visit  Medication Sig Dispense Refill   albuterol  (VENTOLIN  HFA) 108 (90 Base) MCG/ACT inhaler Inhale 2 puffs into the lungs every 4 (four) hours as needed for wheezing or shortness of breath.     budesonide -formoterol  (BREYNA ) 160-4.5 MCG/ACT inhaler Inhale 2 puffs into the lungs 2 (two) times daily.     carvedilol  (COREG ) 3.125 MG tablet Take 1 tablet (3.125 mg total) by mouth 2 (two) times daily. 180 tablet 3   furosemide  (LASIX ) 40 MG tablet Take 40 mg (1 tablet) twice daily for five days, then once daily in the morning. 90 tablet 3   pantoprazole  (PROTONIX ) 40 MG tablet Take 1 tablet (40 mg total) by mouth daily. 90 tablet 0   ipratropium-albuterol  (DUONEB) 0.5-2.5 (3) MG/3ML SOLN Take 3 mLs by nebulization 4 (four) times daily for 7 days, THEN 3 mLs every 6 (six) hours as needed for up to 28 days (SOB, wheezing). (Patient not taking: No sig reported) 360 mL 0   No current facility-administered medications on file prior to visit.  [2] No Known Allergies  "

## 2024-04-20 NOTE — Patient Instructions (Addendum)
 Your provider has requested that you go to the basement level for lab work before leaving today. Press B on the elevator. The lab is located at the first door on the left as you exit the elevator.  Please avoid all NSAID's. Some examples of NSAID's are as follows: Aspirin  (Bufferin, Bayer, and Excedrin) Ibuprofen (Advil, Motrin, Nuprin) Ketoprofen (Actron, Orudis) Naproxen (Aleve) Daypro  Indocin  Lodine  Naprosyn  Relafen  Vimovo Voltaren  REDUCE Carbohydrates   Contact our office when you are ready to schedule your Endoscopy and Colonoscopy. Please follow the written instructions given to you at your visit today.  If you use inhalers (even only as needed), please bring them with you on the day of your procedure.  DO NOT TAKE 7 DAYS PRIOR TO TEST- Trulicity (dulaglutide) Ozempic, Wegovy (semaglutide) Mounjaro (tirzepatide) Bydureon Bcise (exanatide extended release)  DO NOT TAKE 1 DAY PRIOR TO YOUR TEST Rybelsus (semaglutide) Adlyxin (lixisenatide) Victoza (liraglutide) Byetta (exanatide) ___________________________________________________________________________  Please follow up sooner if symptoms increase or worsen __________________________________________________________________________  Due to recent changes in healthcare laws, you may see the results of your imaging and laboratory studies on MyChart before your provider has had a chance to review them.  We understand that in some cases there may be results that are confusing or concerning to you. Not all laboratory results come back in the same time frame and the provider may be waiting for multiple results in order to interpret others.  Please give us  48 hours in order for your provider to thoroughly review all the results before contacting the office for clarification of your results.   Thank you for trusting me with your gastrointestinal care!  _______________________________________________________  If your  blood pressure at your visit was 140/90 or greater, please contact your primary care physician to follow up on this.  _______________________________________________________  If you are age 104 or older, your body mass index should be between 23-30. Your Body mass index is 32.36 kg/m. If this is out of the aforementioned range listed, please consider follow up with your Primary Care Provider.  If you are age 57 or younger, your body mass index should be between 19-25. Your Body mass index is 32.36 kg/m. If this is out of the aformentioned range listed, please consider follow up with your Primary Care Provider.   ________________________________________________________  The [facility] providers would like to encourage you to use MYCHART to communicate with providers for non-urgent requests or questions.  Due to long hold times on the telephone, sending your provider a message by [facility] may be a faster and more efficient way to get a response.  Please allow 48 business hours for a response.  Please remember that this is for non-urgent requests.  _______________________________________________________

## 2024-04-21 LAB — HEPATITIS B CORE ANTIBODY, TOTAL: Hep B Core Total Ab: NONREACTIVE

## 2024-04-21 LAB — IGG: IgG (Immunoglobin G), Serum: 1221 mg/dL (ref 600–1540)

## 2024-04-21 LAB — HEPATITIS A ANTIBODY, TOTAL: Hepatitis A AB,Total: REACTIVE — AB

## 2024-04-21 LAB — HEPATITIS B SURFACE ANTIBODY,QUALITATIVE: Hep B S Ab: NONREACTIVE

## 2024-05-07 NOTE — Progress Notes (Signed)
 [name], Chart reviewed.  Patient was seen during hospitalization in November.  At admission her liver enzymes were completely normal. At present her bilirubin remains slightly elevated but her AST, ALT and alkaline phosphatase have normalized. With positive AMA, normal IgG it is very likely that she has burned-out PBC cirrhosis (possible with ETOH overlap, but she has been abstinent now for years).  In the setting of no cholestasis I would not start ursodiol at this time. I do not think biopsy will help much given her normal enzymes at present  I do recommend that we perform variceal screening I would like her to be seen with Dr. Myrna or Diona at [facility]

## 2024-05-16 ENCOUNTER — Ambulatory Visit

## 2024-05-16 ENCOUNTER — Telehealth: Payer: Self-pay

## 2024-05-16 VITALS — BP 134/79 | HR 58 | Temp 98.0°F | Ht 66.5 in | Wt 203.0 lb

## 2024-05-16 DIAGNOSIS — Z87891 Personal history of nicotine dependence: Secondary | ICD-10-CM

## 2024-05-16 DIAGNOSIS — J4489 Other specified chronic obstructive pulmonary disease: Secondary | ICD-10-CM

## 2024-05-16 DIAGNOSIS — Z23 Encounter for immunization: Secondary | ICD-10-CM

## 2024-05-16 DIAGNOSIS — J439 Emphysema, unspecified: Secondary | ICD-10-CM | POA: Diagnosis not present

## 2024-05-16 LAB — PULMONARY FUNCTION TEST
DL/VA % pred: 95 %
DL/VA: 3.9 ml/min/mmHg/L
DLCO cor % pred: 76 %
DLCO cor: 16.45 ml/min/mmHg
DLCO unc % pred: 75 %
DLCO unc: 16.24 ml/min/mmHg
FEF 25-75 Post: 0.87 L/s
FEF 25-75 Pre: 0.81 L/s
FEF2575-%Change-Post: 7 %
FEF2575-%Pred-Post: 39 %
FEF2575-%Pred-Pre: 37 %
FEV1-%Change-Post: 2 %
FEV1-%Pred-Post: 58 %
FEV1-%Pred-Pre: 56 %
FEV1-Post: 1.51 L
FEV1-Pre: 1.48 L
FEV1FVC-%Change-Post: 0 %
FEV1FVC-%Pred-Pre: 77 %
FEV6-%Change-Post: 2 %
FEV6-%Pred-Post: 77 %
FEV6-%Pred-Pre: 75 %
FEV6-Post: 2.52 L
FEV6-Pre: 2.45 L
FEV6FVC-%Change-Post: 0 %
FEV6FVC-%Pred-Post: 102 %
FEV6FVC-%Pred-Pre: 102 %
FVC-%Change-Post: 2 %
FVC-%Pred-Post: 74 %
FVC-%Pred-Pre: 73 %
FVC-Post: 2.56 L
FVC-Pre: 2.49 L
Post FEV1/FVC ratio: 59 %
Post FEV6/FVC ratio: 99 %
Pre FEV1/FVC ratio: 59 %
Pre FEV6/FVC Ratio: 99 %
RV % pred: 156 %
RV: 3.52 L
TLC % pred: 115 %
TLC: 6.29 L

## 2024-05-16 NOTE — Progress Notes (Signed)
 Full pft performed today

## 2024-05-16 NOTE — Progress Notes (Deleted)
 "   Subjective:   PATIENT ID: [name] GENDER: female DOB: 01/30/1956, MRN: [id number]   HPI Discussed the use of AI scribe software for clinical note transcription with the patient, who gave verbal consent to proceed.  History of Present Illness      Past Medical History:  Diagnosis Date   Anemia    Anxiety    CHF (congestive heart failure) (HCC)    COPD (chronic obstructive pulmonary disease) (HCC)    Dyspnea    Dysrhythmia    Flu    GERD (gastroesophageal reflux disease)    GI bleed 05-Jun-2016   Hyperlipidemia    Hypertension    Myocardial infarction (HCC)    Nonischemic cardiomyopathy (HCC)    a. stress-induced cardiomyopathy by cath 2010 (EF % at that time) - normal cors 06/05/08 & 06-05-2010. b. 06/06/2011: echo showing EF of 50-55%. c. June 05, 2016: NSTEMI with cath showing normal cors and periapical akniesis --> consistent with Takotsubo Cardiomyopathy. EF down to 25-30%.   Obesity    PONV (postoperative nausea and vomiting)    Takotsubo cardiomyopathy    Thyroid disease    Tobacco abuse      Family History  Problem Relation Age of Onset   Lung cancer Mother        died in 06-Jun-2003   Cancer - Lung Mother    COPD Father    Heart disease Brother        CABG at age 64   Breast cancer Maternal Grandmother      Social History   Socioeconomic History   Marital status: Widowed    Spouse name: Not on file   Number of children: 2   Years of education: Not on file   Highest education level: Not on file  Occupational History   Not on file  Tobacco Use   Smoking status: Former    Current packs/day: 0.00    Average packs/day: 1 pack/day for 27.9 years (27.9 ttl pk-yrs)    Types: Cigarettes    Start date: 43    Quit date: 04/03/2003    Years since quitting: 21.1   Smokeless tobacco: Never  Vaping Use   Vaping status: Never Used  Substance and Sexual Activity   Alcohol use: No   Drug use: No   Sexual activity: Not Currently  Other Topics Concern   Not on file  Social  History Narrative   Not on file   Social Drivers of Health   Tobacco Use: Medium Risk (05/16/2024)   Patient History    Smoking Tobacco Use: Former    Smokeless Tobacco Use: Never    Passive Exposure: Not on Actuary Strain: Not on file  Food Insecurity: No Food Insecurity (03/06/2024)   Epic    Worried About Programme Researcher, Broadcasting/film/video in the Last Year: Never true    Ran Out of Food in the Last Year: Never true  Transportation Needs: No Transportation Needs (03/06/2024)   Epic    Lack of Transportation (Medical): No    Lack of Transportation (Non-Medical): No  Physical Activity: Not on file  Stress: Not on file  Social Connections: Socially Isolated (03/06/2024)   Social Connection and Isolation Panel    Frequency of Communication with Friends and Family: More than three times a week    Frequency of Social Gatherings with Friends and Family: Twice a week    Attends Religious Services: Never    Database Administrator or Organizations: No  Attends Banker Meetings: Never    Marital Status: Widowed  Intimate Partner Violence: Not At Risk (03/06/2024)   Epic    Fear of Current or Ex-Partner: No    Emotionally Abused: No    Physically Abused: No    Sexually Abused: No  Depression (PHQ2-9): Not on file  Alcohol Screen: Not on file  Housing: Low Risk (03/06/2024)   Epic    Unable to Pay for Housing in the Last Year: No    Number of Times Moved in the Last Year: 0    Homeless in the Last Year: No  Utilities: Not At Risk (03/06/2024)   Epic    Threatened with loss of utilities: No  Health Literacy: Not on file     Allergies[1]   Outpatient Medications Prior to Visit  Medication Sig Dispense Refill   albuterol  (VENTOLIN  HFA) 108 (90 Base) MCG/ACT inhaler Inhale 2 puffs into the lungs every 4 (four) hours as needed for wheezing or shortness of breath.     budesonide -glycopyrrolate -formoterol  (BREZTRI  AEROSPHERE) 160-9-4.8 MCG/ACT AERO inhaler Inhale 2 puffs  into the lungs 2 (two) times daily.     carvedilol  (COREG ) 3.125 MG tablet Take 1 tablet (3.125 mg total) by mouth 2 (two) times daily. 180 tablet 3   furosemide  (LASIX ) 40 MG tablet Take 40 mg (1 tablet) twice daily for five days, then once daily in the morning. 90 tablet 3   ipratropium-albuterol  (DUONEB) 0.5-2.5 (3) MG/3ML SOLN Take 3 mLs by nebulization 4 (four) times daily for 7 days, THEN 3 mLs every 6 (six) hours as needed for up to 28 days (SOB, wheezing). 360 mL 0   budesonide -formoterol  (BREYNA ) 160-4.5 MCG/ACT inhaler Inhale 2 puffs into the lungs 2 (two) times daily. (Patient not taking: Reported on 05/16/2024)     pantoprazole  (PROTONIX ) 40 MG tablet Take 1 tablet (40 mg total) by mouth daily. (Patient not taking: Reported on 05/16/2024) 90 tablet 0   No facility-administered medications prior to visit.    ROS Reviewed all systems and reported negative except as above     Objective:   Vitals:   05/16/24 1343  BP: 134/79  Pulse: (!) 58  Temp: 98 F (36.7 C)  TempSrc: Oral  SpO2: 97%  Weight: 203 lb (92.1 kg)  Height: 5' 6.5 (1.689 m)    Physical Exam Physical Exam      CBC    Component Value Date/Time   WBC 6.3 04/20/2024 1100   RBC 4.42 04/20/2024 1100   HGB 13.0 04/20/2024 1100   HCT 38.5 04/20/2024 1100   PLT 150.0 04/20/2024 1100   MCV 87.1 04/20/2024 1100   MCH 28.6 03/08/2024 0407   MCHC 33.8 04/20/2024 1100   RDW 17.0 (H) 04/20/2024 1100   LYMPHSABS 1.7 04/20/2024 1100   MONOABS 0.6 04/20/2024 1100   EOSABS 0.0 04/20/2024 1100   BASOSABS 0.1 04/20/2024 1100     Results    PFT:    Latest Ref Rng & Units 05/16/2024   12:35 PM  PFT Results  FVC-Pre L 2.49  P  FVC-Predicted Pre % 73  P  FVC-Post L 2.56  P  FVC-Predicted Post % 74  P  Pre FEV1/FVC % % 59  P  Post FEV1/FCV % % 59  P  FEV1-Pre L 1.48  P  FEV1-Predicted Pre % 56  P  FEV1-Post L 1.51  P  DLCO uncorrected ml/min/mmHg 16.24  P  DLCO UNC% % 75  P  DLCO corrected  ml/min/mmHg 16.45  P  DLCO COR %Predicted % 76  P  DLVA Predicted % 95  P  TLC L 6.29  P  TLC % Predicted % 115  P  RV % Predicted % 156  P    P Preliminary result         Assessment & Plan:   Assessment and Plan Assessment & Plan         Zola Herter, MD [facility] Office: [phone]       [1] No Known Allergies  "

## 2024-05-16 NOTE — Progress Notes (Signed)
 "   Subjective:   PATIENT ID: [name] GENDER: female DOB: 04/22/1956, MRN: [id number]   HPI Discussed the use of AI scribe software for clinical note transcription with the patient, who gave verbal consent to proceed.  History of Present Illness [name] is a 69 year old female with COPD who presents to establish care. She was previously followed by Dr. Meade and is transitioning her care.  She has experienced worsening shortness of breath and COPD symptoms since a hospitalization in June for bronchitis and pneumonia. Symptoms include persistent cough, congestion, and wheezing. Initially, she was on albuterol  and Symbicort , but after seeing Dr. Meade in September, her medication was switched to Breztri , which she takes two puffs in the morning and two at night. She feels 'a lot better' since the switch, although she still experiences shortness of breath occasionally, especially when climbing stairs.  In November, she was hospitalized for pancreatitis and was found to have liver cirrhosis on a CT angiogram. She also experienced a gallbladder attack and recently recovered from the flu, which she believes she contracted from her son and daughter. She was bedridden for a week due to the flu.  She uses a walker to help with breathlessness and experiences breathlessness when walking less than 200 feet. She had one exacerbation requiring prednisone  in June 2025, with no exacerbations the previous year.  She has not smoked since 06/04/02 after a 21-22 year history of smoking three-fourths of a pack per day. 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 She did not receive a flu shot this year due to hospitalization for pancreatitis. She reports having received a pneumonia shot at a [employer] a couple of years ago, but the exact date and type are unclear.     Past Medical History:  Diagnosis Date    Anemia    Anxiety    CHF (congestive heart failure) (HCC)    COPD (chronic obstructive pulmonary disease) (HCC)    Dyspnea    Dysrhythmia    Flu    GERD (gastroesophageal reflux disease)    GI bleed 06/04/16   Hyperlipidemia    Hypertension    Myocardial infarction (HCC)    Nonischemic cardiomyopathy (HCC)    a. stress-induced cardiomyopathy by cath 2010 (EF % at that time) - normal cors 06/04/2008 & Jun 04, 2010. b. 2011/06/05: echo showing EF of 50-55%. c. 06/04/2016: NSTEMI with cath showing normal cors and periapical akniesis --> consistent with Takotsubo Cardiomyopathy. EF down to 25-30%.   Obesity    PONV (postoperative nausea and vomiting)    Takotsubo cardiomyopathy    Thyroid disease    Tobacco abuse      Family History  Problem Relation Age of Onset   Lung cancer Mother        died in 06-05-2003   Cancer - Lung Mother    COPD Father    Heart disease Brother        CABG at age 25   Breast cancer Maternal Grandmother      Social History   Socioeconomic History   Marital status: Widowed    Spouse name: Not on file   Number of children: 2   Years of education: Not on file   Highest education level: Not on file  Occupational History   Not on file  Tobacco Use   Smoking status: Former    Current packs/day: 0.00  Average packs/day: 1 pack/day for 27.9 years (27.9 ttl pk-yrs)    Types: Cigarettes    Start date: 47    Quit date: 04/03/2003    Years since quitting: 21.1   Smokeless tobacco: Never  Vaping Use   Vaping status: Never Used  Substance and Sexual Activity   Alcohol use: No   Drug use: No   Sexual activity: Not Currently  Other Topics Concern   Not on file  Social History Narrative   Not on file   Social Drivers of Health   Tobacco Use: Medium Risk (05/16/2024)   Patient History    Smoking Tobacco Use: Former    Smokeless Tobacco Use: Never    Passive Exposure: Not on Actuary Strain: Not on file  Food Insecurity: No Food Insecurity (03/06/2024)    Epic    Worried About Programme Researcher, Broadcasting/film/video in the Last Year: Never true    Ran Out of Food in the Last Year: Never true  Transportation Needs: No Transportation Needs (03/06/2024)   Epic    Lack of Transportation (Medical): No    Lack of Transportation (Non-Medical): No  Physical Activity: Not on file  Stress: Not on file  Social Connections: Socially Isolated (03/06/2024)   Social Connection and Isolation Panel    Frequency of Communication with Friends and Family: More than three times a week    Frequency of Social Gatherings with Friends and Family: Twice a week    Attends Religious Services: Never    Database Administrator or Organizations: No    Attends Banker Meetings: Never    Marital Status: Widowed  Intimate Partner Violence: Not At Risk (03/06/2024)   Epic    Fear of Current or Ex-Partner: No    Emotionally Abused: No    Physically Abused: No    Sexually Abused: No  Depression (PHQ2-9): Not on file  Alcohol Screen: Not on file  Housing: Low Risk (03/06/2024)   Epic    Unable to Pay for Housing in the Last Year: No    Number of Times Moved in the Last Year: 0    Homeless in the Last Year: No  Utilities: Not At Risk (03/06/2024)   Epic    Threatened with loss of utilities: No  Health Literacy: Not on file     Allergies[1]   Outpatient Medications Prior to Visit  Medication Sig Dispense Refill   albuterol  (VENTOLIN  HFA) 108 (90 Base) MCG/ACT inhaler Inhale 2 puffs into the lungs every 4 (four) hours as needed for wheezing or shortness of breath.     budesonide -glycopyrrolate -formoterol  (BREZTRI  AEROSPHERE) 160-9-4.8 MCG/ACT AERO inhaler Inhale 2 puffs into the lungs 2 (two) times daily.     carvedilol  (COREG ) 3.125 MG tablet Take 1 tablet (3.125 mg total) by mouth 2 (two) times daily. 180 tablet 3   furosemide  (LASIX ) 40 MG tablet Take 40 mg (1 tablet) twice daily for five days, then once daily in the morning. 90 tablet 3   ipratropium-albuterol  (DUONEB)  0.5-2.5 (3) MG/3ML SOLN Take 3 mLs by nebulization 4 (four) times daily for 7 days, THEN 3 mLs every 6 (six) hours as needed for up to 28 days (SOB, wheezing). 360 mL 0   budesonide -formoterol  (BREYNA ) 160-4.5 MCG/ACT inhaler Inhale 2 puffs into the lungs 2 (two) times daily. (Patient not taking: Reported on 05/16/2024)     pantoprazole  (PROTONIX ) 40 MG tablet Take 1 tablet (40 mg total) by mouth daily. (Patient not taking:  Reported on 05/16/2024) 90 tablet 0   No facility-administered medications prior to visit.    ROS Reviewed all systems and reported negative except as above     Objective:   Vitals:   05/16/24 1343  BP: 134/79  Pulse: (!) 58  Temp: 98 F (36.7 C)  TempSrc: Oral  SpO2: 97%  Weight: 203 lb (92.1 kg)  Height: 5' 6.5 (1.689 m)    Physical Exam Physical Exam GENERAL: Appropriate to age, no acute distress. HEAD EYES EARS NOSE THROAT: Moist mucous membranes, atraumatic, normocephalic. CHEST: Clear to auscultation bilaterally, no wheezing, no crackles, no rales. CARDIAC: Regular rate and rhythm, normal S1, normal S2, no murmurs, no rubs, no gallops. ABDOMEN: Soft, nontender. NEUROLOGICAL: Motor and sensation grossly intact, alert and oriented times X 3. EXTREMITIES: Warm, well perfused, no edema.     CBC    Component Value Date/Time   WBC 6.3 04/20/2024 1100   RBC 4.42 04/20/2024 1100   HGB 13.0 04/20/2024 1100   HCT 38.5 04/20/2024 1100   PLT 150.0 04/20/2024 1100   MCV 87.1 04/20/2024 1100   MCH 28.6 03/08/2024 0407   MCHC 33.8 04/20/2024 1100   RDW 17.0 (H) 04/20/2024 1100   LYMPHSABS 1.7 04/20/2024 1100   MONOABS 0.6 04/20/2024 1100   EOSABS 0.0 04/20/2024 1100   BASOSABS 0.1 04/20/2024 1100     Results Radiology CT angiogram chest (03/2024): Pancreatitis and hepatic cirrhosis; no new pulmonary abnormalities  Diagnostic Pulmonary function tests (05/16/2024): FVC 74% predicted, FEV1 56% predicted, FEV1/FVC ratio 59% predicted, total lung  capacity 115% predicted, DLCO 76% predicted; consistent with chronic obstructive pulmonary disease and emphysema   PFT:    Latest Ref Rng & Units 05/16/2024   12:35 PM  PFT Results  FVC-Pre L 2.49  P  FVC-Predicted Pre % 73  P  FVC-Post L 2.56  P  FVC-Predicted Post % 74  P  Pre FEV1/FVC % % 59  P  Post FEV1/FCV % % 59  P  FEV1-Pre L 1.48  P  FEV1-Predicted Pre % 56  P  FEV1-Post L 1.51  P  DLCO uncorrected ml/min/mmHg 16.24  P  DLCO UNC% % 75  P  DLCO corrected ml/min/mmHg 16.45  P  DLCO COR %Predicted % 76  P  DLVA Predicted % 95  P  TLC L 6.29  P  TLC % Predicted % 115  P  RV % Predicted % 156  P    P Preliminary result         Assessment & Plan:   Assessment and Plan Assessment & Plan 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 DLCO at 76% predicted. Symptoms include intermittent dyspnea and occasional exacerbations. Well-managed on Breztri . - Continue Breztri , 2 puffs in the morning and 2 at night. - Administered pneumococcal polysaccharide vaccine today. - Administered influenza vaccine today. - Referred to virtual pulmonary rehabilitation program. - Completed paperwork for handicap placard based on dyspnea and use of walker.        Zola Herter, MD [facility] Office: [phone]       [1] No Known Allergies  "

## 2024-05-16 NOTE — Telephone Encounter (Signed)
 error

## 2024-05-16 NOTE — Patient Instructions (Signed)
 Full pft performed today

## 2024-05-16 NOTE — Patient Instructions (Signed)
" °  VISIT SUMMARY: [name] is a 69 year old female with COPD who came in to establish care. She has experienced worsening shortness of breath and COPD symptoms since a hospitalization in June for bronchitis and pneumonia. Her medication was switched to Breztri , which has improved her symptoms, although she still experiences occasional shortness of breath. She was also hospitalized in November for pancreatitis and was found to have liver cirrhosis. She recently recovered from the flu. She uses a walker to help with breathlessness and experiences breathlessness when walking less than 200 feet. She has not smoked since 2004 and did not receive a flu shot this year due to hospitalization for pancreatitis. She received a pneumonia shot a couple of years ago, but the exact date and type are unclear.  YOUR PLAN: -CHRONIC OBSTRUCTIVE PULMONARY DISEASE (COPD): COPD is a chronic lung disease that causes obstructed airflow from the lungs, leading to breathing difficulties. Your COPD is moderate to severe, with symptoms including intermittent shortness of breath and occasional exacerbations. You are currently well-managed on Breztri , which you should continue taking as prescribed (2 puffs in the morning and 2 at night). Today, you received the pneumococcal polysaccharide vaccine and the influenza vaccine. You have been referred to a virtual pulmonary rehabilitation program to help manage your symptoms. Additionally, paperwork for a handicap placard has been completed based on your breathlessness and use of a walker.  INSTRUCTIONS: Please continue taking Breztri  as prescribed. Follow up with the virtual pulmonary rehabilitation program as referred. Use the handicap placard as needed. If you experience any worsening of symptoms or new issues, please contact our office.               Contains text generated by [employer]bridge.   "

## 2024-05-18 ENCOUNTER — Other Ambulatory Visit: Payer: Self-pay

## 2024-05-25 ENCOUNTER — Telehealth: Payer: Self-pay | Admitting: Pharmacy Technician

## 2024-05-25 NOTE — Telephone Encounter (Signed)
 SABRA

## 2024-05-25 NOTE — Telephone Encounter (Signed)
 Hi, can someone please send the farxiga  prescription to [facility] pharmacy:[facility] - [place], [place] - [address] [place] [place] [address]. Thank you!

## 2024-05-28 NOTE — Telephone Encounter (Signed)
 Spoke with patient. Patient states she was taken off Farxiga  and did not need any further assistance with this, as she was no longer taking. Appointment made with Dr Mona in February.  Pt verbalizes understanding of plan.

## 2024-06-05 NOTE — Telephone Encounter (Signed)
 Entry error

## 2024-06-14 ENCOUNTER — Ambulatory Visit: Admitting: Internal Medicine
